# Patient Record
Sex: Male | Born: 1945
Health system: Southern US, Community
[De-identification: ages and names within clinical notes are randomized; demographics above are authoritative.]

## PROBLEM LIST (undated history)

## (undated) DIAGNOSIS — I499 Cardiac arrhythmia, unspecified: Secondary | ICD-10-CM

## (undated) DIAGNOSIS — K219 Gastro-esophageal reflux disease without esophagitis: Secondary | ICD-10-CM

## (undated) DIAGNOSIS — D696 Thrombocytopenia, unspecified: Secondary | ICD-10-CM

## (undated) DIAGNOSIS — R000 Tachycardia, unspecified: Secondary | ICD-10-CM

## (undated) DIAGNOSIS — M6740 Ganglion, unspecified site: Secondary | ICD-10-CM

## (undated) DIAGNOSIS — E785 Hyperlipidemia, unspecified: Secondary | ICD-10-CM

## (undated) HISTORY — PX: OTHER SURGICAL HISTORY: SHX169

## (undated) HISTORY — DX: Ganglion, unspecified site: M67.40

## (undated) HISTORY — DX: Thrombocytopenia, unspecified: D69.6

## (undated) HISTORY — PX: GANGLION CYST EXCISION: SHX1691

## (undated) HISTORY — PX: KNEE ARTHROSCOPY: SUR90

## (undated) HISTORY — DX: Tachycardia, unspecified: R00.0

## (undated) HISTORY — DX: Hyperlipidemia, unspecified: E78.5

## (undated) HISTORY — PX: TONSILLECTOMY: SUR1361

---

## 2003-07-12 ENCOUNTER — Encounter: Payer: Self-pay | Admitting: Cardiology

## 2003-07-15 ENCOUNTER — Ambulatory Visit (HOSPITAL_COMMUNITY): Admission: RE | Admit: 2003-07-15 | Discharge: 2003-07-15 | Payer: Self-pay | Admitting: *Deleted

## 2003-07-22 ENCOUNTER — Ambulatory Visit (HOSPITAL_COMMUNITY): Admission: RE | Admit: 2003-07-22 | Discharge: 2003-07-22 | Payer: Self-pay | Admitting: Cardiology

## 2003-07-29 ENCOUNTER — Ambulatory Visit (HOSPITAL_COMMUNITY): Admission: RE | Admit: 2003-07-29 | Discharge: 2003-07-29 | Payer: Self-pay | Admitting: *Deleted

## 2003-12-19 ENCOUNTER — Ambulatory Visit (HOSPITAL_COMMUNITY): Admission: RE | Admit: 2003-12-19 | Discharge: 2003-12-19 | Payer: Self-pay | Admitting: General Surgery

## 2006-08-21 ENCOUNTER — Encounter (INDEPENDENT_AMBULATORY_CARE_PROVIDER_SITE_OTHER): Payer: Self-pay | Admitting: *Deleted

## 2006-08-21 ENCOUNTER — Ambulatory Visit (HOSPITAL_COMMUNITY): Admission: RE | Admit: 2006-08-21 | Discharge: 2006-08-21 | Payer: Self-pay | Admitting: Orthopaedic Surgery

## 2007-02-12 ENCOUNTER — Emergency Department (HOSPITAL_COMMUNITY): Admission: EM | Admit: 2007-02-12 | Discharge: 2007-02-12 | Payer: Self-pay | Admitting: Emergency Medicine

## 2007-12-07 ENCOUNTER — Ambulatory Visit: Payer: Self-pay | Admitting: Internal Medicine

## 2007-12-07 ENCOUNTER — Ambulatory Visit (HOSPITAL_COMMUNITY): Admission: RE | Admit: 2007-12-07 | Discharge: 2007-12-07 | Payer: Self-pay | Admitting: Internal Medicine

## 2008-05-31 ENCOUNTER — Ambulatory Visit (HOSPITAL_COMMUNITY): Admission: RE | Admit: 2008-05-31 | Discharge: 2008-05-31 | Payer: Self-pay | Admitting: Orthopaedic Surgery

## 2009-10-04 ENCOUNTER — Encounter (INDEPENDENT_AMBULATORY_CARE_PROVIDER_SITE_OTHER): Payer: Self-pay | Admitting: *Deleted

## 2009-10-04 LAB — CONVERTED CEMR LAB
ALT: 12 units/L
AST: 14 units/L
Albumin: 4.6 g/dL
Alkaline Phosphatase: 53 units/L
BUN: 16 mg/dL
CO2: 27 meq/L
Calcium: 9.6 mg/dL
Chloride: 102 meq/L
Cholesterol: 214 mg/dL
Creatinine, Ser: 0.92 mg/dL
GFR calc non Af Amer: >60 mL/min
Glomerular Filtration Rate, Af Am: >60 mL/min/{1.73_m2}
Glucose, Bld: 85 mg/dL
HDL: 78 mg/dL
LDL Cholesterol: 121 mg/dL
Potassium: 4.6 meq/L
Sodium: 140 meq/L
Total Protein: 7.3 g/dL
Triglycerides: 77 mg/dL

## 2009-10-07 ENCOUNTER — Observation Stay (HOSPITAL_COMMUNITY): Admission: EM | Admit: 2009-10-07 | Discharge: 2009-10-08 | Payer: Self-pay | Admitting: Emergency Medicine

## 2009-10-07 ENCOUNTER — Encounter (INDEPENDENT_AMBULATORY_CARE_PROVIDER_SITE_OTHER): Payer: Self-pay | Admitting: *Deleted

## 2009-10-07 LAB — CONVERTED CEMR LAB
BUN: 17 mg/dL
CO2: 28 meq/L
Calcium: 9.2 mg/dL
Chloride: 103 meq/L
Creatinine, Ser: 0.95 mg/dL
GFR calc non Af Amer: >60 mL/min
Glomerular Filtration Rate, Af Am: >60 mL/min/{1.73_m2}
Glucose, Bld: 111 mg/dL
Potassium: 3.7 meq/L
Sodium: 139 meq/L

## 2009-10-08 ENCOUNTER — Encounter (INDEPENDENT_AMBULATORY_CARE_PROVIDER_SITE_OTHER): Payer: Self-pay | Admitting: *Deleted

## 2009-10-08 LAB — CONVERTED CEMR LAB
ALT: 14 units/L
AST: 15 units/L
Albumin: 3.5 g/dL
Alkaline Phosphatase: 45 units/L
BUN: 13 mg/dL
CO2: 28 meq/L
Calcium: 8.7 mg/dL
Chloride: 108 meq/L
Cholesterol: 191 mg/dL
Cholesterol: 191 mg/dL
Creatinine, Ser: 0.88 mg/dL
GFR calc non Af Amer: >60 mL/min
Glomerular Filtration Rate, Af Am: >60 mL/min/{1.73_m2}
Glucose, Bld: 101 mg/dL
HDL: 66 mg/dL
HDL: 66 mg/dL
LDL Cholesterol: 107 mg/dL
LDL Cholesterol: 107 mg/dL
Potassium: 4.2 meq/L
Sodium: 141 meq/L
Total Protein: 6 g/dL
Triglycerides: 88 mg/dL
Triglycerides: 88 mg/dL

## 2009-10-09 ENCOUNTER — Encounter: Payer: Self-pay | Admitting: Cardiology

## 2009-10-11 ENCOUNTER — Encounter (INDEPENDENT_AMBULATORY_CARE_PROVIDER_SITE_OTHER): Payer: Self-pay | Admitting: *Deleted

## 2009-10-11 DIAGNOSIS — D696 Thrombocytopenia, unspecified: Secondary | ICD-10-CM

## 2009-10-11 DIAGNOSIS — R079 Chest pain, unspecified: Secondary | ICD-10-CM | POA: Insufficient documentation

## 2009-10-12 ENCOUNTER — Ambulatory Visit: Payer: Self-pay | Admitting: Cardiology

## 2009-10-13 ENCOUNTER — Encounter: Payer: Self-pay | Admitting: Cardiology

## 2009-10-24 ENCOUNTER — Encounter (INDEPENDENT_AMBULATORY_CARE_PROVIDER_SITE_OTHER): Payer: Self-pay | Admitting: *Deleted

## 2009-10-24 ENCOUNTER — Ambulatory Visit: Payer: Self-pay | Admitting: Cardiology

## 2009-10-24 DIAGNOSIS — R5381 Other malaise: Secondary | ICD-10-CM | POA: Insufficient documentation

## 2009-10-24 DIAGNOSIS — R5383 Other fatigue: Secondary | ICD-10-CM

## 2009-10-24 LAB — CONVERTED CEMR LAB: TSH: 2.292 microintl units/mL (ref 0.350–4.500)

## 2009-10-25 ENCOUNTER — Ambulatory Visit: Payer: Self-pay | Admitting: Cardiovascular Disease

## 2009-10-25 ENCOUNTER — Ambulatory Visit (HOSPITAL_COMMUNITY): Admission: RE | Admit: 2009-10-25 | Discharge: 2009-10-25 | Payer: Self-pay | Admitting: Cardiology

## 2009-10-25 ENCOUNTER — Encounter: Payer: Self-pay | Admitting: Cardiology

## 2009-10-30 ENCOUNTER — Encounter (INDEPENDENT_AMBULATORY_CARE_PROVIDER_SITE_OTHER): Payer: Self-pay | Admitting: *Deleted

## 2009-11-01 ENCOUNTER — Encounter (INDEPENDENT_AMBULATORY_CARE_PROVIDER_SITE_OTHER): Payer: Self-pay | Admitting: *Deleted

## 2009-11-01 ENCOUNTER — Ambulatory Visit: Payer: Self-pay | Admitting: Cardiology

## 2009-12-14 ENCOUNTER — Ambulatory Visit: Payer: Self-pay | Admitting: Cardiology

## 2010-01-03 ENCOUNTER — Encounter: Payer: Self-pay | Admitting: Cardiology

## 2010-10-09 NOTE — Miscellaneous (Signed)
Summary: hosp labs 10/07/09-10/08/2009  Clinical Lists Changes  Observations: Added new observation of CALCIUM: 8.7 mg/dL (35/57/3220 25:42) Added new observation of ALBUMIN: 3.5 g/dL (70/62/3762 83:15) Added new observation of PROTEIN, TOT: 6.0 g/dL (17/61/6073 71:06) Added new observation of SGPT (ALT): 14 units/L (10/08/2009 16:33) Added new observation of SGOT (AST): 15 units/L (10/08/2009 16:33) Added new observation of ALK PHOS: 45 units/L (10/08/2009 16:33) Added new observation of GFR AA: >60 mL/min/1.44m2 (10/08/2009 16:33) Added new observation of GFR: >60 mL/min (10/08/2009 16:33) Added new observation of CREATININE: 0.88 mg/dL (26/94/8546 27:03) Added new observation of BUN: 13 mg/dL (50/05/3817 29:93) Added new observation of BG RANDOM: 101 mg/dL (71/69/6789 38:10) Added new observation of CO2 PLSM/SER: 28 meq/L (10/08/2009 16:33) Added new observation of CL SERUM: 108 meq/L (10/08/2009 16:33) Added new observation of K SERUM: 4.2 meq/L (10/08/2009 16:33) Added new observation of NA: 141 meq/L (10/08/2009 16:33) Added new observation of LDL: 107 mg/dL (17/51/0258 52:77) Added new observation of HDL: 66 mg/dL (82/42/3536 14:43) Added new observation of TRIGLYC TOT: 88 mg/dL (15/40/0867 61:95) Added new observation of CHOLESTEROL: 191 mg/dL (09/32/6712 45:80) Added new observation of CALCIUM: 9.2 mg/dL (99/83/3825 05:39) Added new observation of GFR AA: >60 mL/min/1.7m2 (10/07/2009 16:33) Added new observation of GFR: >60 mL/min (10/07/2009 16:33) Added new observation of CREATININE: 0.95 mg/dL (76/73/4193 79:02) Added new observation of BUN: 17 mg/dL (40/97/3532 99:24) Added new observation of BG RANDOM: 111 mg/dL (26/83/4196 22:29) Added new observation of CO2 PLSM/SER: 28 meq/L (10/07/2009 16:33) Added new observation of CL SERUM: 103 meq/L (10/07/2009 16:33) Added new observation of K SERUM: 3.7 meq/L (10/07/2009 16:33) Added new observation of NA: 139 meq/L (10/07/2009  16:33)

## 2010-10-09 NOTE — Letter (Signed)
Summary: PROGRESS NOTE  PROGRESS NOTE   Imported By: Faythe Ghee 10/13/2009 09:50:05  _____________________________________________________________________  External Attachment:    Type:   Image     Comment:   External Document

## 2010-10-09 NOTE — Assessment & Plan Note (Signed)
Summary: f/u [facility] per checkout on 10/24/09/tg   Visit Type:  Follow-up Primary Provider:  Sabino Snipes  CC:  no complaints today.  History of Present Illness: Return visit for this very pleasant 65 year old gentleman who recently underwent treadmill testing for chest discomfort.  He did fairly well on the treadmill, achieving a work load of 9 met.  There were no electrocardiogram changes to suggest ischemia.  He experienced no chest discomfort and stopped with dyspnea and fatigue.  Immediately after the cessation of exercise, wide-complex tachycardia occurred without symptoms that persisted for approximately 15 seconds.  It was not entirely clear whether this was supraventricular tachycardia with aberrancy or ventricular tachycardia.  Patient reports a remote history of palpitations, which have recently recurred.  One episode persisted for an entire day.  He describes a fluttering of his heart without other associated symptoms.  He has not had recurrent chest discomfort.  There has been no lightheadedness or syncope.  His wife reports that he is under substantial stress both job-related and family related.  Current Medications (verified): 1)  Aspir-Low 81 Mg Tbec (Aspirin) .... Take 1 Tab Daily 2)  Daily Multi  Tabs (Multiple Vitamins-Minerals) .... Take 1 Tab Daily  Allergies (verified): No Known Drug Allergies  Past History:     Past Medical History: Exercise-induced wide complex tachycardia CHEST PAIN (ICD-786.50) Borderline thrombocytopenia Borderline hyperlipidemia  Review of Systems       See history of present illness  Vital Signs:  Patient profile:   65 year old male Weight:      185 pounds Pulse rate:   61 / minute BP sitting:   119 / 72  (right arm)  Vitals Entered By: Dreama Saa, CNA (November 01, 2009 1:40 PM)  Physical Exam  General:   General-Well-developed; no acute distress; weight and height are proportionate HEENT-/AT; PERRL; EOM intact; conjunctiva  and lids nl:  Neck-No JVD; no carotid bruits: Lungs-No tachypnea, clear without rales, rhonchi or wheezes: Abdomen-BS normal; soft and non-tender without masses or organomegaly: Skin- Warm, no sig. lesions: Extremities- no edema    Impression & Recommendations:  Problem # 1:  WCT-EXERCISE-INDUCED (ICD-427.1) The patient, his wife and I had a long discussion about supraventricular and ventricular arrhythmias and cardiac disease in general I explained that with a normal echocardiogram, his arrhythmias on the certainly benign.  Treatment with a calcium channel antagonist or beta blocker could be undertaken, but there would be no end point to allow [place] to determine efficacy.  Since his risk of sudden death should be minimal, no intervention to lower that likelihood is appropriate.  I will review his tracings and history with one of our electrophysiologists for further guidance.  Since he is having continuing palpitations, and event recorder will be provided to him for 21 days.  I will see him again in 4 weeks.  25 minutes spent in discussion during this visit.  Addendum:  Tracing reviewed with Dr. Ladona Ridgel.  He agrees that rhythm is likely SVT, perhaps atrial flutter, with aberrancy.  He concurs that no pharmacologic treatment is necessarily indicated.  Other Orders: Cardionet/Event Monitor (Cardionet/Event)  Echocardiogram  Procedure date:  10/25/2009  Findings:      LV-no left ventricular hypertrophy; normal regional and global function; estimated ejection fraction 55-60%.  No significant valvular abnormalities.  No chamber enlargement.   Patient Instructions: 1)  Your physician recommends that you schedule a follow-up appointment in: 1 month 2)  Your physician has recommended that you wear an event monitor.  Event monitors are medical devices that record the heart's electrical activity. Doctors most often use these monitors to diagnose arrhythmias. Arrhythmias are problems with the  speed or rhythm of the heartbeat. The monitor is a small, portable device. You can wear one while you do your normal daily activities. This is usually used to diagnose what is causing palpitations/syncope (passing out).

## 2010-10-09 NOTE — Miscellaneous (Signed)
  Clinical Lists Changes  Problems: Added new problem of FATIGUE (ICD-780.79) Orders: Added new Test order of T-TSH [phone]) - Signed Added new Referral order of 2-D Echocardiogram (2D Echo) - Signed

## 2010-10-09 NOTE — Cardiovascular Report (Signed)
Summary: End of Summary Report  End of Summary Report   Imported By: Lenard Forth 02/15/2010 14:14:16  _____________________________________________________________________  External Attachment:    Type:   Image     Comment:   External Document

## 2010-10-09 NOTE — Letter (Signed)
Summary: [place] [facility], agricultural at [facility]  [address], [place] [address]   Phone: [phone]  Fax: [phone]      October 30, 2009 MRN: [id number]   Greg Mann [address] [place], [place]  [address]   Dear Mr. Kozak,  Your test ordered by Selena Batten has been reviewed by your physician (or physician assistant) and was found to be normal or stable. Your physician (or physician assistant) felt no changes were needed at this time.  _x___ Echocardiogram  ____ Cardiac Stress Test  __x__ Lab Work  ____ Peripheral vascular study of arms, legs or neck  ____ CT scan or X-ray  ____ Lung or Breathing test  ____ Other:  No change in medical treatment at this time, per Dr. Dietrich Pates.  Enclosed is a copy of your labwork for your records.  Thank you, Khalia Gong Allyne Gee RN    [place] Bing, MD, Lenise Arena.C.Gaylord Shih, MD, F.A.C.C Lewayne Bunting, MD, F.A.C.C Nona Dell, MD, F.A.C.C Charlton Haws, MD, Lenise Arena.C.C

## 2010-10-09 NOTE — Miscellaneous (Signed)
Summary: labs cmp,lipids,10/04/2009  Clinical Lists Changes  Observations: Added new observation of CALCIUM: 9.6 mg/dL (16/06/9603 5:40) Added new observation of ALBUMIN: 4.6 g/dL (98/07/9146 8:29) Added new observation of PROTEIN, TOT: 7.3 g/dL (56/21/3086 5:78) Added new observation of SGPT (ALT): 12 units/L (10/04/2009 8:38) Added new observation of SGOT (AST): 14 units/L (10/04/2009 8:38) Added new observation of ALK PHOS: 53 units/L (10/04/2009 8:38) Added new observation of GFR AA: >60 mL/min/1.2m2 (10/04/2009 8:38) Added new observation of GFR: >60 mL/min (10/04/2009 8:38) Added new observation of CREATININE: 0.92 mg/dL (46/96/2952 8:41) Added new observation of BUN: 16 mg/dL (32/44/0102 7:25) Added new observation of BG RANDOM: 85 mg/dL (36/64/4034 7:42) Added new observation of CO2 PLSM/SER: 27 meq/L (10/04/2009 8:38) Added new observation of CL SERUM: 102 meq/L (10/04/2009 8:38) Added new observation of K SERUM: 4.6 meq/L (10/04/2009 8:38) Added new observation of NA: 140 meq/L (10/04/2009 8:38) Added new observation of LDL: 121 mg/dL (59/56/3875 6:43) Added new observation of HDL: 78 mg/dL (32/95/1884 1:66) Added new observation of TRIGLYC TOT: 77 mg/dL (03/08/1600 0:93) Added new observation of CHOLESTEROL: 214 mg/dL (23/55/7322 0:25)

## 2010-10-09 NOTE — Letter (Signed)
Summary: letter from dr hardin  letter from dr hardin   Imported By: Faythe Ghee 10/13/2009 15:02:22  _____________________________________________________________________  External Attachment:    Type:   Image     Comment:   External Document

## 2010-10-09 NOTE — Assessment & Plan Note (Signed)
Summary: **Initial OV and Stress Test(in append)   Visit Type:  Initial Consult Primary Provider:  Sabino Snipes   History of Present Illness: [name] is seen at the kind request of Dr. Catalina Pizza for evaluation of chest discomfort.  This nice gentleman was previously evaluated by Dr. Dorethea Clan in 2004 for palpitations, cardiomegaly and chest discomfort.  An echocardiogram at that time was essentially normal.  A stress nuclear study was  normal with good exercise tolerance.  Chest x-ray showed mild bronchitic changes.  Patient has done well in the intervening years with no apparent vascular disease and few risk factors.  He has had no hypertension, no diabetes and average values on lipid profiles.  There is a positive family history with his father dying due to aortic aneurysm and his brother requiring a stent for coronary disease.  Current symptoms are limited to a single episode.  The patient noted palpitations early in the day and then developed some mild burning discomfort around the heart.  There was some associated pressure as well with radiation to the left arm.  Symptoms continued on and off all day prompting the patient to seek evaluation in the emergency department.  During an observation stay, cardiac markers and serial EKGs were negative.  Current Medications (verified): 1)  Aspir-Low 81 Mg Tbec (Aspirin) .... Take 1 Tab Daily 2)  Daily Multi  Tabs (Multiple Vitamins-Minerals) .... Take 1 Tab Daily 3)  Omeprazole 20 Mg Cpdr (Omeprazole) .... 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  Alcohol Use -  yes(occasionally wine) Regular Exercise - no Drug Use - no  Past Medical History: CHEST PAIN (ICD-786.50) Borderline thrombocytopenia Borderline hyperlipidemia  Past Surgical History: Arthroscopic left knee procedure Left wrist ganglion excised by Dr.Jenkins Tonsillectomy  EKG  Procedure date:  07-Nov-2009  Findings:      Normal sinus rhythm Borderline biatrial enlargement Right axis deviation PACs Slightly delayed R-wave progression When compared to a previous tracing of 07/15/2003, R wave progression was previously entirely normal.   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  Alcohol Use - yes(occasionally wine) Regular Exercise - no Drug Use - no  Review of Systems       Corrective lenses required.  Occasional mild palpitations.  Gastroesophageal reflux disease symptoms.  All other systems reviewed and are negative.  Vital Signs:  Patient profile:   65 year old male Height:      71 inches Weight:      183 pounds BMI:     25.62 Pulse rate:   69 / minute BP sitting:   123 / 79  (right arm)  Vitals Entered By: Dreama Saa, CNA 11-07-2009 1:44 PM)  Physical Exam  General:   General-Well-developed; no acute distress; weight and height are proportionate HEENT-Advance/AT; PERRL; EOM intact; conjunctiva and lids nl:  Neck-No JVD; no carotid bruits: Endocrine-No thyromegaly: Lungs-No tachypnea, clear without rales, rhonchi or wheezes: CV-normal PMI; normal S1 and  S2; minimal systolic murmur Abdomen-BS normal; soft and non-tender without masses or organomegaly: MS-No deformities, cyanosis or clubbing: Neurologic-Nl cranial nerves; symmetric strength and tone: Skin- Warm, no sig. lesions: Extremities-Nl distal  pulses; no edema    Impression & Recommendations:  Problem # 1:  CHEST PAIN (ICD-786.50) Chest discomfort is certainly of some concern, particularly with radiation to the left arm; however, his symptoms are atypical in terms of their prolonged nature without any evidence for myocardial ischemia and lack of relationship to exertion.  For a single episode in this gentleman with few cardiovascular risk factors, stress testing should provide an adequate assessment for possible heart disease.  We will schedule that test at the patient's earliest convenience and will let Dr. Margo Aye  know the results once it has been completed.  Unless additional problems develop, I do not believe that a return office visit will be necessary.  Other Orders: Treadmill (Treadmill)  Patient Instructions: 1)  Your physician recommends that you schedule a follow-up appointment in: as needed  2)  Your physician recommends that you continue on your current medications as directed. Please refer to the Current Medication list given to you today. 3)  Your physician has requested that you have an exercise tolerance test.  For further information please visit [email or web address]  Please also follow instruction sheet, as given.  Appended Document: Treadmill Stress Test w/o imaging Graded Exercise Test  Treadmill exercise was performed to a workload of 7.8 METs and a heart rate of 193, 123% of age-predicted maximum.  Exercise was discontinued due to to dyspnea and fatigue; no chest pain reported.  Blood pressure-resting value was 145/80.  With exercise, there was no significant change.  Early in recovery, blood pressure fell to 110/50, an abnormal response.    Baseline electrocardiogram-normal sinus rhythm; left atrial abnormality; slightly delayed R-wave progression; nondiagnostic inferior Q waves; right axis deviation. Stress electrocardiogram-significant upsloping ST segment depression.  Arrhythmias-during exercise, no  arrhythmias were noted.  Immediately upon cessation, a narrow complex nonsustained supraventricular rhythm was present at a rate of 171.  This was intermittently replaced or followed by a wide complex tachycardia at a similar rate.  Following the fourth minute of recovery, only an occasional premature wide complex beat was present.  None of these arrhythmias were symptomatic.  Impression: Abnormal graded exercise test revealing impaired exercise capacity, an abnormal blood pressure response and no electrocardiographic evidence for myocardial ischemia.  Early in recovery, arrhythmia was present that appear to represent a supraventricular tachycardia or atrial flutter with intermittent aberrant conduction.  Other findings as noted.  [place] Bing, M.D.

## 2010-11-25 LAB — LIPID PANEL
Cholesterol: 191 mg/dL (ref 0–200)
HDL: 66 mg/dL (ref >39)
LDL Cholesterol: 107 mg/dL — ABNORMAL HIGH (ref 0–99)
Total CHOL/HDL Ratio: 2.9 RATIO
Triglycerides: 88 mg/dL (ref <150)
VLDL: 18 mg/dL (ref 0–40)

## 2010-11-25 LAB — COMPREHENSIVE METABOLIC PANEL
ALT: 14 U/L (ref 0–53)
AST: 15 U/L (ref 0–37)
Albumin: 3.5 g/dL (ref 3.5–5.2)
Alkaline Phosphatase: 45 U/L (ref 39–117)
BUN: 13 mg/dL (ref 6–23)
CO2: 28 mEq/L (ref 19–32)
Calcium: 8.7 mg/dL (ref 8.4–10.5)
Chloride: 108 mEq/L (ref 96–112)
Creatinine, Ser: 0.88 mg/dL (ref 0.4–1.5)
GFR calc Af Amer: >60 mL/min (ref >60)
GFR calc non Af Amer: >60 mL/min (ref >60)
Glucose, Bld: 101 mg/dL — ABNORMAL HIGH (ref 70–99)
Potassium: 4.2 mEq/L (ref 3.5–5.1)
Sodium: 141 mEq/L (ref 135–145)
Total Bilirubin: 1.1 mg/dL (ref 0.3–1.2)
Total Protein: 6 g/dL (ref 6.0–8.3)

## 2010-11-25 LAB — CARDIAC PANEL(CRET KIN+CKTOT+MB+TROPI)
CK, MB: 2.7 ng/mL (ref 0.3–4.0)
CK, MB: 2.8 ng/mL (ref 0.3–4.0)
Relative Index: 2.5 (ref 0.0–2.5)
Relative Index: INVALID (ref 0.0–2.5)
Total CK: 113 U/L (ref 7–232)
Total CK: 96 U/L (ref 7–232)
Troponin I: 0.02 ng/mL (ref 0.00–0.06)
Troponin I: 0.02 ng/mL (ref 0.00–0.06)
Troponin I: 0.04 ng/mL (ref 0.00–0.06)

## 2010-11-25 LAB — DIFFERENTIAL
Basophils Absolute: 0 10*3/uL (ref 0.0–0.1)
Basophils Absolute: 0 10*3/uL (ref 0.0–0.1)
Basophils Relative: 0 % (ref 0–1)
Basophils Relative: 1 % (ref 0–1)
Eosinophils Absolute: 0.2 10*3/uL (ref 0.0–0.7)
Eosinophils Absolute: 0.3 10*3/uL (ref 0.0–0.7)
Eosinophils Relative: 3 % (ref 0–5)
Eosinophils Relative: 5 % (ref 0–5)
Lymphocytes Relative: 27 % (ref 12–46)
Lymphocytes Relative: 29 % (ref 12–46)
Lymphs Abs: 1.4 10*3/uL (ref 0.7–4.0)
Lymphs Abs: 2.5 10*3/uL (ref 0.7–4.0)
Monocytes Absolute: 0.5 10*3/uL (ref 0.1–1.0)
Monocytes Absolute: 0.7 10*3/uL (ref 0.1–1.0)
Monocytes Relative: 10 % (ref 3–12)
Monocytes Relative: 8 % (ref 3–12)
Neutro Abs: 3 10*3/uL (ref 1.7–7.7)
Neutro Abs: 5.1 10*3/uL (ref 1.7–7.7)
Neutrophils Relative %: 58 % (ref 43–77)
Neutrophils Relative %: 59 % (ref 43–77)

## 2010-11-25 LAB — POCT CARDIAC MARKERS
CKMB, poc: 2.2 ng/mL (ref 1.0–8.0)
Myoglobin, poc: 71.5 ng/mL (ref 12–200)
Troponin i, poc: <0.05 ng/mL (ref 0.00–0.09)

## 2010-11-25 LAB — BASIC METABOLIC PANEL
BUN: 17 mg/dL (ref 6–23)
CO2: 28 mEq/L (ref 19–32)
Calcium: 9.2 mg/dL (ref 8.4–10.5)
Chloride: 103 mEq/L (ref 96–112)
Creatinine, Ser: 0.95 mg/dL (ref 0.4–1.5)
GFR calc Af Amer: >60 mL/min (ref >60)
GFR calc non Af Amer: >60 mL/min (ref >60)
Glucose, Bld: 111 mg/dL — ABNORMAL HIGH (ref 70–99)
Potassium: 3.7 mEq/L (ref 3.5–5.1)
Sodium: 139 mEq/L (ref 135–145)

## 2010-11-25 LAB — TSH: TSH: 2.149 u[IU]/mL (ref 0.350–4.500)

## 2010-11-25 LAB — CBC
HCT: 40.5 % (ref 39.0–52.0)
HCT: 44.6 % (ref 39.0–52.0)
Hemoglobin: 13.7 g/dL (ref 13.0–17.0)
Hemoglobin: 15 g/dL (ref 13.0–17.0)
MCHC: 33.6 g/dL (ref 30.0–36.0)
MCHC: 33.8 g/dL (ref 30.0–36.0)
MCV: 95.2 fL (ref 78.0–100.0)
MCV: 96.1 fL (ref 78.0–100.0)
Platelets: 125 10*3/uL — ABNORMAL LOW (ref 150–400)
Platelets: 137 10*3/uL — ABNORMAL LOW (ref 150–400)
RBC: 4.22 MIL/uL (ref 4.22–5.81)
RBC: 4.68 MIL/uL (ref 4.22–5.81)
RDW: 13.8 % (ref 11.5–15.5)
RDW: 13.9 % (ref 11.5–15.5)
WBC: 5.2 10*3/uL (ref 4.0–10.5)
WBC: 8.6 10*3/uL (ref 4.0–10.5)

## 2010-11-25 LAB — VITAMIN B12: Vitamin B-12: 300 pg/mL (ref 211–911)

## 2010-11-25 LAB — D-DIMER, QUANTITATIVE: D-Dimer, Quant: 0.33 ug/mL-FEU (ref 0.00–0.48)

## 2011-01-22 NOTE — Op Note (Signed)
NAME:  Greg Mann, Greg NO.:  [id number]   MEDICAL RECORD NO.:  [id number]          PATIENT TYPE:  AMB   LOCATION:  DAY                           FACILITY:  [facility]   PHYSICIAN:  R. Roetta Sessions, M.D. DATE OF BIRTH:  [birth year]   DATE OF PROCEDURE:  12/07/2007  DATE OF DISCHARGE:                               OPERATIVE REPORT   PROCEDURE:  Screening colonoscopy.   INDICATIONS FOR PROCEDURE:  A 65 year old gentleman with no lower GI  tract symptoms, sent over courtesy of Dr. Catalina Pizza for colorectal  cancer screening.  He has never his lower GI tract imaged.  There is no  family history of colorectal placed.  Colonoscopy is now being done.  This approach has been discussed with the patient at length.  Potential  risks, benefits, limitations, and alternatives have been reviewed and  his questions answered.  He is agreeable to this.  Please see  documentation in the medical record.   PROCEDURE NOTE:  O2 saturation, blood pressure, pulse, and respirations  monitor throughout the entire procedure.   CONSCIOUS SEDATION:  Versed 5 mg IV. Demerol 100 grams IV in divided  doses.   INSTRUMENT:  Pentax video chip system.   FINDINGS:  Digital rectal exam revealed no abnormalities.  Endoscopic  findings:  The prep was unfortunately marginal.  Colon:  Colonic mucosa  was surveyed from the rectosigmoid junction through the left,  transverse, and right colon to the appendiceal orifice, ileocecal valve,  and cecum.  These structures well seen and photographed for the record.  Terminal ileum was entered for 5 cm.  From this level, the scope was  slowly withdrawn.  All previously mentioned mucosal surfaces were again  seen.  It is notable that as I advanced scope into the ascending colon,  initially the video image went out, and it came back and went out  intermittently as I worked gently the insertion tube.  This was felt to  be a faulty instrument.  Therefore, it was withdrawn,  and a second scope  was obtained.  It easily advanced to the cecum and then pulled back.  All previously mentioned mucosal surfaces of the colon were again seen.  There was quite a bit of granular stool with vegetable material which  required copious washing and suctioning to adequately see the mucosa.  The mucosa of the colon was felt to be seen fairly well, although very  small flat lesions may have been obscured by the poor prep.  However,  the colonic mucosa that was seen did appeared normal.  The scope was  pulled down to the rectum, where thorough examination of the rectal  mucosa, including retroflexed view of the anal verge, demonstrated only  internal hemorrhoids.  The patient tolerated the procedure well and was  reactivated.   ENDOSCOPIC IMPRESSION:  Internal hemorrhoids, otherwise normal rectum,  colon, and terminal ileum.  Marginal prep made exam more difficult.   RECOMMENDATIONS:  1. Yearly Hemoccults.  2. Repeat screening colonoscopy in 10 years.      Jonathon Bellows, M.D.  Electronically Signed  RMR/MEDQ  D:  12/07/2007  T:  12/07/2007  Job:  [id number]   cc:   Catalina Pizza, M.D.  Fax: [phone]

## 2011-01-25 NOTE — Procedures (Signed)
NAME:  Greg Mann, Greg Mann                         ACCOUNT NO.:  [id number]   MEDICAL RECORD NO.:  [id number]                   PATIENT TYPE:  OUT   LOCATION:  RAD                                  FACILITY:  [facility]   PHYSICIAN:  [place] Bing, M.D.               DATE OF BIRTH:  1945-09-21   DATE OF PROCEDURE:  07/29/2003  DATE OF DISCHARGE:                                  ECHOCARDIOGRAM   CLINICAL DATA:  A 66 year old gentleman with cardiomegaly.   M-MODE MEASUREMENTS:  Aorta 2.4.   Left atrium 4.1.   Septum 1.1.   Posterior wall 1.0.   Left ventricular diastole 4.1.   Left ventricular systole 2.6.   Right ventricle 4.3.   1. Technically adequate echocardiographic study.  2. Very mild enlargement of both the right and left atria. The right     ventricle is mildly enlarged with normal wall thickness and normal     function.  3. Slight aortic sclerosis with normal function.  4. Slight mitral valve thickening with normal function.  5. Normal tricuspid and pulmonic valves; physiologic tricuspid     regurgitation; estimated right ventricular systolic pressure at the upper     limit of normal.  6. Inferior vena cava diameter upper normal; normal decrease in diameter     with inspiration.      ___________________________________________                                            [place] Bing, M.D.   RR/MEDQ  D:  07/29/2003  T:  07/29/2003  Job:  [id number]

## 2011-01-25 NOTE — H&P (Signed)
NAME:  Greg Mann, MCFETRIDGE NO.:  [id number]   MEDICAL RECORD NO.:  [id number]          PATIENT TYPE:  AMB   LOCATION:                                FACILITY:  [facility]   PHYSICIAN:  J. Darreld Mclean, M.D. DATE OF BIRTH:  07/11/1946   DATE OF ADMISSION:  DATE OF DISCHARGE:  [facility]                              HISTORY & PHYSICAL   CHIEF COMPLAINT:  My left knee hurts.   HISTORY OF PRESENT ILLNESS:  The patient is a 65 year old male with pain  and tenderness in his left knee for approximately one month to six  weeks.  It is getting progressively worse.  He has had swelling, limping  and slight giving way.  I first saw him in my office on August 04, 2006 and I was concerned of a strain in the medial collateral ligament  and a medial meniscal tear.  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  I informed him of the  findings on August 07, 2006. He wanted to think about it, talk about  with his family, possible surgery.  His knee pain has gotten worse. He  is limping more, knee is giving way, it is swelling more and he desires  to undergo arthroscopy of the left knee.  The risks and imponderables of  the procedure have been discussed with him and he appears to understand  and agrees to the procedure as outlined.   PAST MEDICAL HISTORY:   CURRENT MEDICATIONS:  He is currently taking Naprosyn 500 mg one p.o.  b.i.d. and Vicodin 5/500 p.r.n. pain.   ALLERGIES:  He has no allergies.   PAST SURGICAL HISTORY:  He has had a tonsillectomy and adenoidectomy as  a child and left wrist ganglion removed in the past.   SOCIAL HISTORY:  He does not smoke.  He drinks alcoholic beverages  socially.   PRIMARY CARE PHYSICIAN:  Edward L. Juanetta Gosling, M.D. is his family doctor.   FAMILY HISTORY:  He denies any diseases that run in the family.   PHYSICAL EXAMINATION:  VITAL SIGNS:   The patient's vital signs are  within normal limits.  HEENT:  Negative.  NECK:  Supple.  LUNGS:  Clear to auscultation and percussion.  HEART:  Regular without murmurs heard.  ABDOMEN:  Soft, tender without masses.  EXTREMITIES:  Left knee is swollen with pain and tenderness with an  effusion.  He has crepitus.  Range of motion is from 5 degrees to 95  with pain.  Other extremities are within normal limits.  CNS:  Intact.  SKIN:  Intact.   IMPRESSION:  Tear medial meniscus left knee.   PLAN:  I have discussed with the patient the planned procedure, risks  and imponderables.  Patient understands the procedure as outlined.  Labs  are pending.  ______________________________  [name]. Darreld Mclean, M.D.     JWK/MEDQ  D:  08/18/2006  T:  08/18/2006  Job:  [id number]

## 2011-01-25 NOTE — Procedures (Signed)
NAME:  CALE, BETHARD                         ACCOUNT NO.:  [id number]   MEDICAL RECORD NO.:  [id number]                   PATIENT TYPE:  AMB   LOCATION:  DAY                                  FACILITY:  [facility]   PHYSICIAN:  Edward L. Juanetta Gosling, M.D.             DATE OF BIRTH:  July 18, 1946   DATE OF PROCEDURE:  DATE OF DISCHARGE:                                EKG INTERPRETATION   TIME:  December 15, 2003, at 12:13.   FINDINGS:  1. The rhythm is a sinus rhythm with a rate of about 60.  2. There is an intraventricular conduction delay.  3. There is possible biatrial enlargement.  4. P-wave abnormalities are seen, mostly with an unusual shape of the T     wave.   IMPRESSION:  Abnormal electrocardiogram.      ___________________________________________                                            Oneal Deputy. Juanetta Gosling, M.D.   ELH/MEDQ  D:  12/15/2003  T:  12/16/2003  Job:  [id number]

## 2011-01-25 NOTE — Op Note (Signed)
NAME:  Greg Mann, LACK NO.:  [id number]   MEDICAL RECORD NO.:  [id number]          PATIENT TYPE:  AMB   LOCATION:  DAY                           FACILITY:  [facility]   PHYSICIAN:  J. Darreld Mclean, M.D. DATE OF BIRTH:  10/27/1945   DATE OF PROCEDURE:  08/21/2006  DATE OF DISCHARGE:                               OPERATIVE REPORT   PREOPERATIVE DIAGNOSIS:  Tear left knee medial meniscus.   POSTOPERATIVE DIAGNOSIS:  Tear left knee medial meniscus.   OPERATION PERFORMED:  Operative arthroscopy, partial medial  meniscectomy.   SURGEON:  J. Darreld Mclean, M.D.   ANESTHESIA:  General.   TOURNIQUET TIME:  25 mg.   INDICATIONS FOR PROCEDURE:  The patient is a 65 year old male with pain  and tenderness in his left knee.  This became progressively worse over  the last month or so.  He had MRI done that showed a tear of the  posterior horn of the medial meniscus.  Surgery was recommended.  Risks  and imponderables of the procedure were discussed preoperatively.  He  appeared to understand the procedure as outlined.   DESCRIPTION OF PROCEDURE:  The patient was taken to the holding area.  The left knee was identified as the correct surgical side.  He placed a  mark on the left knee.  I placed a mark on the left knee.  He was  brought back to the operating room.  He was given general anesthesia and  placed supine.  Tourniquet and leg holder placed deflated left upper  thigh.  He was prepped and draped in the usual manner. A timeout to  identify Mr. Hornback as the patient and left knee as the correct  surgical site.  Leg was elevated after being prepped and draped and  wrapped circumferentially with an Esmarch bandage.  Tourniquet inflated  at 300 mmHg.  Esmarch bandage removed.  Inflow cannula inserted  medially.  Lactated Ringers __________ knee by infusion pump.  Arthroscope inserted laterally, knee systematically examined.   Suprapatellar pouch.  There was some mild  synovitis undersurface of  patella, looked good.  There was grade 2 changes.  Medially there was  tear in posterior horn medial meniscus, grade 2 and 3 changes femoral  condyle.  Anterior cruciates intact.  Laterally meniscus looked  normal  and no apparent tear.   He had no loose bodies.  Using a meniscal shaver and meniscal punch and  the laser, good smooth contour was obtained in the posterior horn of the  meniscus.  I removed an area that was torn.  I re-examined the knee and  no new pathology found.  Permanent pictures taken throughout the  procedure.  Wound was reapproximated with 3-0 nylon, interrupted  vertical mattress manner.  Marcaine 0.25% was instilled in each portal.  Tourniquet was deflated.  The  patient tolerated the procedure well.  Went to recovery in good  condition.  Prescription for Vicodin ES was given for pain.  I will see  him in the office in approximately 10 days to two weeks.  Physical  therapy has been arranged.  Any difficulties contact me through the  office hospital beeper system.  Numbers have been provided.           ______________________________  [name]. Darreld Mclean, M.D.     JWK/MEDQ  D:  08/21/2006  T:  08/21/2006  Job:  [id number]

## 2011-01-25 NOTE — Op Note (Signed)
NAME:  Greg Mann, Greg Mann                         ACCOUNT NO.:  [id number]   MEDICAL RECORD NO.:  [id number]                   PATIENT TYPE:  AMB   LOCATION:  DAY                                  FACILITY:  [facility]   PHYSICIAN:  Dalia Heading, M.D.               DATE OF BIRTH:  01-Dec-1945   DATE OF PROCEDURE:  12/19/2003  DATE OF DISCHARGE:                                 OPERATIVE REPORT   PREOPERATIVE DIAGNOSIS:  Ganglion cysts, left wrist and left fourth finger.   POSTOPERATIVE DIAGNOSIS:  Ganglion cysts, left wrist and left fourth finger.   OPERATION/PROCEDURE:  Excision of ganglion cysts, left wrist and left fourth  finger.   SURGEON:  Dalia Heading, M.D.   ANESTHESIA:  Regional block.   INDICATIONS:  The patient is a 65 year old white male who presents with  symptomatic ganglion cyst of the left wrist and left fourth finger.  The  risks and benefits of procedures including bleeding, infection, recurrence  of the cysts were fully explained to the patient who gave informed consent.   DESCRIPTION OF PROCEDURE:  The patient was placed in the supine position.  The left wrist was blocked using a regional by anesthesia.  The left wrist  and hand were prepped and draped using the usual sterile technique with  Betadine.  Surgical site confirmation was performed.   A longitudinal incision was made along the dorsum of the left wrist over the  ganglion cyst.  This measured approximately 2.5 cm in size.  This was taken  down to the base and the cyst was excised without difficulty.  It was sent  to pathology for further examination.  No abnormal bleeding was noted.  The  skin was reapproximated using a 4-0 nylon interrupted suture.   A small, less than 5 mm, cyst was noted just proximal to the left nail bed  dorsally.  This was excised without difficulty.  The incision was  reapproximated using a 5-0 interrupted suture.  Betadine ointment and dry  sterile dressings were applied to both  wounds.   All tape and needle counts correct at the end of the procedure.  The patient  was transferred to the PACU in stable condition.  No complications.  Specimens were ganglion cysts left wrist and left fourth finger.  Blood loss  none.      ___________________________________________                                            Dalia Heading, M.D.   MAJ/MEDQ  D:  12/19/2003  T:  12/19/2003  Job:  [id number]   cc:   Corrie Mckusick, M.D.  [address] [place]. [place]  [place]  [place] [address]  Fax: [phone]

## 2011-01-25 NOTE — H&P (Signed)
NAME:  Greg Mann, Greg Mann                         ACCOUNT NO.:  [id number]   MEDICAL RECORD NO.:  [id number]                   PATIENT TYPE:  AMB   LOCATION:  DAY                                  FACILITY:  [facility]   PHYSICIAN:  Dalia Heading, M.D.               DATE OF BIRTH:  [birth year]   DATE OF ADMISSION:  DATE OF DISCHARGE:                                HISTORY & PHYSICAL   CHIEF COMPLAINT:  Ganglion cyst, left wrist and left fourth finger.   HISTORY OF PRESENT ILLNESS:  Patient is a 65 year old white male who is  referred for evaluation and treatment of several ganglion cysts.  One is on  his left wrist and another is just proximal to the nail of the left fourth  finger.  Both have been present for some time, although they are causing  increase in discomfort.   PAST MEDICAL HISTORY:  Unremarkable.   PAST SURGICAL HISTORY:  Tonsillectomy.   CURRENT MEDICATIONS:  None.   ALLERGIES:  No known drug allergies.   REVIEW OF SYSTEMS:  Noncontributory.   PHYSICAL EXAMINATION:  GENERAL:  Patient is a well-developed and well-  nourished white male in no acute distress.  VITAL SIGNS:  He is afebrile.  Vital signs are stable.  LUNGS:  Clear to auscultation with equal breath sounds bilaterally.  HEART:  Regular rate and rhythm without S3, S4, or murmurs.  EXTREMITIES:  The left wrist with a posterior, 2.5 cm, mobile, subcutaneous  mass present.  A less than 1 cm oval mass is noted just proximal to the  nailbed of the left fourth finger.  Some abnormal growth of the nailbed is  noted.   IMPRESSION:  Ganglion cysts, left wrist and left fourth finger.   PLAN:  The patient is scheduled for excision of the ganglion cyst, left  wrist, and left fourth finger on December 19, 2003.  The risks and benefits of  the procedures, including bleeding, infection, recurrence of the cysts, and  abnormal growth of the nail were fully explained to the patient.  Gave  informed consent.     ___________________________________________                                         Dalia Heading, M.D.   MAJ/MEDQ  D:  12/15/2003  T:  12/15/2003  Job:  [id number]   cc:   Corrie Mckusick, M.D.  [address] [place]. [place]  [place]  [place] [address]  Fax: [phone]

## 2011-01-25 NOTE — H&P (Signed)
NAME:  Greg Mann, HU NO.:  [id number]   MEDICAL RECORD NO.:  [id number]          PATIENT TYPE:  AMB   LOCATION:  DAY                           FACILITY:  [facility]   PHYSICIAN:  J. Darreld Mclean, M.D. DATE OF BIRTH:  1945/10/02   DATE OF ADMISSION:  DATE OF DISCHARGE:  LH                              HISTORY & PHYSICAL   CHIEF COMPLAINT:  My left knee hurts.   The patient is a 65 year old male with pain and tenderness in his left  knee for approximately a month.  I first saw him in the office on  November 26th, complaining of left knee pain for 2-3 weeks.  This got  progressively worse with giving-way and swelling.  He had pain over the  medial collateral ligament.  I was concerned about a strain to the  medial collateral ligament, a possible medial meniscal tear.  I arranged  an MRI of the left knee which was done on November 27.  This showed a  large posterior horn medial meniscal tear with medial compartment  chondromalacia.  I explained the findings to him on November 29th.  He  wanted to think about possible surgery.  He called and I saw him on  December 10th.  He wants to go ahead and have arthroscopy of the knee.  I have discussed with the patient the planned procedure, risks, and  imponderables.  He appears to understands the procedure as outlined.   PAST HISTORY:  Negative.  He denies heart disease, lung disease, kidney  disease, stroke, paralysis, weakness, hypertension, diabetes, TB,  rheumatic fever, cancer, polio, ulcer disease, circulatory problems.   ALLERGIES:  HE HAS NO ALLERGIES.   MEDICATIONS:  He is currently taking:  1. Naprosyn 500 mg p.o. b.i.d.  2. Vicodin 5/500.   He does not smoke.  He uses alcoholic beverages socially.   FAMILY PHYSICIAN:  Dr. Juanetta Gosling.   He has had a cyst removed from his   Dictation ended at this point.                                            ______________________________  J. Darreld Mclean, M.D.     JWK/MEDQ  D:  08/19/2006  T:  08/19/2006  Job:  [id number]

## 2011-09-05 ENCOUNTER — Encounter: Payer: Self-pay | Admitting: Cardiology

## 2012-11-02 ENCOUNTER — Emergency Department (HOSPITAL_COMMUNITY): Payer: 59

## 2012-11-02 ENCOUNTER — Encounter (HOSPITAL_COMMUNITY): Payer: Self-pay | Admitting: *Deleted

## 2012-11-02 ENCOUNTER — Emergency Department (HOSPITAL_COMMUNITY)
Admission: EM | Admit: 2012-11-02 | Discharge: 2012-11-02 | Disposition: A | Discharge status: Home / Self Care | Payer: 59 | Attending: Emergency Medicine | Admitting: Emergency Medicine

## 2012-11-02 DIAGNOSIS — Z8639 Personal history of other endocrine, nutritional and metabolic disease: Secondary | ICD-10-CM | POA: Insufficient documentation

## 2012-11-02 DIAGNOSIS — R079 Chest pain, unspecified: Secondary | ICD-10-CM | POA: Insufficient documentation

## 2012-11-02 DIAGNOSIS — Z8679 Personal history of other diseases of the circulatory system: Secondary | ICD-10-CM | POA: Insufficient documentation

## 2012-11-02 DIAGNOSIS — Z7982 Long term (current) use of aspirin: Secondary | ICD-10-CM | POA: Insufficient documentation

## 2012-11-02 DIAGNOSIS — Z862 Personal history of diseases of the blood and blood-forming organs and certain disorders involving the immune mechanism: Secondary | ICD-10-CM | POA: Insufficient documentation

## 2012-11-02 DIAGNOSIS — Z8739 Personal history of other diseases of the musculoskeletal system and connective tissue: Secondary | ICD-10-CM | POA: Insufficient documentation

## 2012-11-02 LAB — TROPONIN I
Troponin I: <0.3 ng/mL (ref <0.30)
Troponin I: <0.3 ng/mL (ref <0.30)

## 2012-11-02 LAB — CBC WITH DIFFERENTIAL/PLATELET
Basophils Absolute: 0 10*3/uL (ref 0.0–0.1)
Eosinophils Relative: 4 % (ref 0–5)
HCT: 43.7 % (ref 39.0–52.0)
Lymphocytes Relative: 23 % (ref 12–46)
MCHC: 33.2 g/dL (ref 30.0–36.0)
MCV: 95.2 fL (ref 78.0–100.0)
Monocytes Absolute: 0.5 10*3/uL (ref 0.1–1.0)
RDW: 13.9 % (ref 11.5–15.5)
WBC: 7.2 10*3/uL (ref 4.0–10.5)

## 2012-11-02 LAB — COMPREHENSIVE METABOLIC PANEL
AST: 19 U/L (ref 0–37)
BUN: 15 mg/dL (ref 6–23)
CO2: 29 mEq/L (ref 19–32)
Calcium: 9.8 mg/dL (ref 8.4–10.5)
Creatinine, Ser: 1.02 mg/dL (ref 0.50–1.35)
GFR calc Af Amer: 86 mL/min — ABNORMAL LOW (ref >90)
GFR calc non Af Amer: 74 mL/min — ABNORMAL LOW (ref >90)

## 2012-11-02 MED ORDER — HYDROCODONE-ACETAMINOPHEN 5-325 MG PO TABS: 1.0000 | ORAL_TABLET | Freq: Four times a day (QID) | ORAL | Status: DC | PRN

## 2012-11-02 MED ORDER — MORPHINE SULFATE 4 MG/ML IJ SOLN
4.0000 mg | Freq: Once | INTRAMUSCULAR | Status: AC
  Administered 2012-11-02: 4 mg via INTRAVENOUS
  Filled 2012-11-02: qty 1

## 2012-11-02 NOTE — ED Provider Notes (Signed)
History    This chart was scribed for Benny Lennert, MD by Charolett Bumpers, ED Scribe. The patient was seen in room [facility]. Patient's care was started at 1850.   CSN: [id number]  Arrival date & time 11/02/12  4098   First MD Initiated Contact with Patient 11/02/12 1850      Chief Complaint  Patient presents with  . Chest Pain   Greg Mann is a 67 y.o. male who presents to the Emergency Department complaining of left sided burning sensation in his chest that radiates down his left arm for the past couple of days. He states that the pain has gradually eased up. He states that he has had acid reflux for the past couple of weeks in which he started taking nexium once daily. He denies any nausea, vomiting, SOB, diaphoresis, leg pain or leg swelling. He reports a h/o similar symptoms a couple of years ago with nothing significant found with work up at that time. He takes a baby aspirin daily.   PCP: Dr. Dwana Melena  Patient is a 67 y.o. male presenting with chest pain. The history is provided by the patient. No language interpreter was used.  Chest Pain Pain location:  L chest Pain quality: burning   Pain radiates to:  L arm Pain severity:  Moderate Onset quality:  Gradual Duration:  2 days Progression:  Improving Ineffective treatments:  Antacids Associated symptoms: no abdominal pain, no back pain, no cough, no diaphoresis, no fatigue, no headache, no nausea, no shortness of breath and not vomiting     Past Medical History  Diagnosis Date  . Exercise-induced tachycardia   . Ganglion     left wrist  . Chest pain   . Thrombocytopenia     borderline  . Borderline hyperlipidemia     Past Surgical History  Procedure Laterality Date  . Knee arthroscopy      Left  . Ganglion cyst excision      Left Wrist  . Tonsillectomy      Family History  Problem Relation Age of Onset  . Aneurysm Father     Aortic  . Coronary artery disease Brother     PCI    History   Substance Use Topics  . Smoking status: Never Smoker   . Smokeless tobacco: Not on file  . Alcohol Use: Yes     Comment: Occasional wine      Review of Systems  Constitutional: Negative for diaphoresis and fatigue.  HENT: Negative for congestion, sinus pressure and ear discharge.   Eyes: Negative for discharge.  Respiratory: Negative for cough and shortness of breath.   Cardiovascular: Positive for chest pain.  Gastrointestinal: Negative for nausea, vomiting, abdominal pain and diarrhea.  Genitourinary: Negative for frequency and hematuria.  Musculoskeletal: Negative for back pain.  Skin: Negative for rash.  Neurological: Negative for seizures and headaches.  Psychiatric/Behavioral: Negative for hallucinations.  All other systems reviewed and are negative.    Allergies  Review of patient's allergies indicates no known allergies.  Home Medications   Current Outpatient Rx  Name  Route  Sig  Dispense  Refill  . aspirin 81 MG tablet   Oral   Take 160 mg by mouth daily.           . Multiple Vitamin (MULTIVITAMIN) tablet   Oral   Take 1 tablet by mouth daily.             BP 112/64  Pulse 78  Temp(Src) 97.7 F (36.5 C) (Oral)  Resp 20  Ht 5\' 11"  (1.803 m)  Wt 170 lb (77.111 kg)  BMI 23.72 kg/m2  SpO2 100%  Physical Exam  Nursing note and vitals reviewed. Constitutional: He is oriented to person, place, and time. He appears well-developed.  HENT:  Head: Normocephalic and atraumatic.  Mouth/Throat: Oropharynx is clear and moist.  Eyes: Conjunctivae and EOM are normal. No scleral icterus.  Neck: Neck supple. No thyromegaly present.  Cardiovascular: Normal rate, regular rhythm and normal heart sounds.  Exam reveals no gallop and no friction rub.   No murmur heard. Pulmonary/Chest: Effort normal and breath sounds normal. No stridor. No respiratory distress. He has no wheezes. He has no rales. He exhibits no tenderness.  Abdominal: Soft. Bowel sounds are  normal. He exhibits no distension. There is no tenderness. There is no rebound.  Musculoskeletal: Normal range of motion. He exhibits no edema.  Lymphadenopathy:    He has no cervical adenopathy.  Neurological: He is oriented to person, place, and time. Coordination normal.  Skin: No rash noted. No erythema.  Psychiatric: He has a normal mood and affect. His behavior is normal.    ED Course  Procedures (including critical care time)  DIAGNOSTIC STUDIES: Oxygen Saturation is 100% on room air, normal by my interpretation.    COORDINATION OF CARE:  19:05-Discussed planned course of treatment with the patient including pain management, a chest x-ray, blood work and EKG, who is agreeable at this time.   19:15-Medication Orders: Morphine 4 mg/mL injection 4 mg-once.   22:30-Recheck: Informed pt of imaging and lab results. Pt reports improvement of his chest pain with ED medications. Will d/c with pain medication and increase Nexium to twice daily. Will have f/u with PCP for re-evaluation. He is agreeable with plan.   Results for orders placed during the hospital encounter of 11/02/12  CBC WITH DIFFERENTIAL      Result Value Range   WBC 7.2  4.0 - 10.5 K/uL   RBC 4.59  4.22 - 5.81 MIL/uL   Hemoglobin 14.5  13.0 - 17.0 g/dL   HCT 16.1  09.6 - 04.5 %   MCV 95.2  78.0 - 100.0 fL   MCH 31.6  26.0 - 34.0 pg   MCHC 33.2  30.0 - 36.0 g/dL   RDW 40.9  81.1 - 91.4 %   Platelets 165  150 - 400 K/uL   Neutrophils Relative 66  43 - 77 %   Neutro Abs 4.7  1.7 - 7.7 K/uL   Lymphocytes Relative 23  12 - 46 %   Lymphs Abs 1.6  0.7 - 4.0 K/uL   Monocytes Relative 7  3 - 12 %   Monocytes Absolute 0.5  0.1 - 1.0 K/uL   Eosinophils Relative 4  0 - 5 %   Eosinophils Absolute 0.3  0.0 - 0.7 K/uL   Basophils Relative 1  0 - 1 %   Basophils Absolute 0.0  0.0 - 0.1 K/uL  COMPREHENSIVE METABOLIC PANEL      Result Value Range   Sodium 138  135 - 145 mEq/L   Potassium 3.7  3.5 - 5.1 mEq/L   Chloride  100  96 - 112 mEq/L   CO2 29  19 - 32 mEq/L   Glucose, Bld 114 (*) 70 - 99 mg/dL   BUN 15  6 - 23 mg/dL   Creatinine, Ser 7.82  0.50 - 1.35 mg/dL   Calcium 9.8  8.4 - 95.6  mg/dL   Total Protein 7.5  6.0 - 8.3 g/dL   Albumin 4.1  3.5 - 5.2 g/dL   AST 19  0 - 37 U/L   ALT 14  0 - 53 U/L   Alkaline Phosphatase 63  39 - 117 U/L   Total Bilirubin 0.4  0.3 - 1.2 mg/dL   GFR calc non Af Amer 74 (*) >90 mL/min   GFR calc Af Amer 86 (*) >90 mL/min  TROPONIN I      Result Value Range   Troponin I <0.30  <0.30 ng/mL    Dg Chest Portable 1 View  11/02/2012  *RADIOLOGY REPORT*  Clinical Data: Chest pain.  Thrombocytopenia.  PORTABLE CHEST - 1 VIEW  Comparison: 10/07/2009  Findings: Tortuous thoracic aorta noted.  Heart size within normal limits.  The lungs appear clear.  No pleural effusion identified.  IMPRESSION: 1.  No acute thoracic findings.  2.  Mildly tortuous thoracic aorta.   Original Report Authenticated By: Gaylyn Rong, M.D.      No diagnosis found.    MDM     The chart was scribed for me under my direct supervision.  I personally performed the history, physical, and medical decision making and all procedures in the evaluation of this patient.Benny Lennert, MD 11/02/12 [phone]

## 2012-11-02 NOTE — ED Notes (Signed)
Discharge instructions given and reviewed with patient.  Prescription given for Vicodin; effects and use explained.  Patient verbalized understanding of sedating effects of Vicodin and not to drive while taking.  Patient verbalized understanding to follow up with his primary MD as this week and to increase his Nexium to twice daily.  Patient ambulatory with steady gait; discharged home in good condition.  Wife accompanied discharge to drive home.

## 2012-11-02 NOTE — ED Notes (Signed)
Burning sensation to chest and lt arm .  No N/V,  No sob.

## 2012-11-02 NOTE — ED Notes (Signed)
Patient c/o burning sensation to left arm that radiates "over my heart".  Patient states just started Nexium recently for GERD.  Patient states had this same pain a few years ago and was cardiac etiology was ruled out at that time.

## 2013-02-26 ENCOUNTER — Ambulatory Visit (INDEPENDENT_AMBULATORY_CARE_PROVIDER_SITE_OTHER): Payer: Medicare Other | Admitting: Cardiology

## 2013-02-26 ENCOUNTER — Encounter: Payer: Self-pay | Admitting: Cardiology

## 2013-02-26 VITALS — BP 133/84 | HR 59 | Ht 71.0 in | Wt 176.4 lb

## 2013-02-26 DIAGNOSIS — I471 Supraventricular tachycardia: Secondary | ICD-10-CM | POA: Diagnosis not present

## 2013-02-26 DIAGNOSIS — R002 Palpitations: Secondary | ICD-10-CM | POA: Diagnosis not present

## 2013-02-26 NOTE — Assessment & Plan Note (Signed)
Is difficult to tell by history what is causing his symptoms. It could be an atrial arrhythmia. He's having a couple times a week Sathvika Ojo pain an event recorder with strict instructions to correlate with a diary entry. We'll also obtain a 2-D echocardiogram to assess LV function and any degree of left atrial enlargement or mitral regurgitation. No change in his current medical program for now.

## 2013-02-26 NOTE — Progress Notes (Signed)
HPI Greg Mann  Is referred today by Dr. Margo Aye for the evaluation and management of recurrent tachypalpitations. He reports having this at age 67 but it resolved. Is been labeled exercise induced tachycardia by previous visits here. This started being a problem once again several months ago. It can happen at rest. It is not have a sudden onset or sudden termination. He cannot tell if this regular or irregular. He feels very weak and tired when it happens. It can last up to several hours. There no other associated symptoms.  He was in the emergency room in February of this year for chest pain. EKG at that time showed biatrial enlargement otherwise normal. This was felt to be gastroesophageal reflux.  He denies orthopnea, PND or edema. He's had no syncope or presyncope.  Echocardiogram several years ago was essentially normal except for mild mitral regurgitation.  Past Medical History  Diagnosis Date  . Exercise-induced tachycardia   . Ganglion     left wrist  . Chest pain   . Thrombocytopenia     borderline  . Borderline hyperlipidemia     Current Outpatient Prescriptions  Medication Sig Dispense Refill  . aspirin 81 MG tablet Take 81 mg by mouth daily.       Marland Kitchen esomeprazole (NEXIUM) 40 MG capsule Take 40 mg by mouth as needed.       Marland Kitchen HYDROcodone-acetaminophen (NORCO/VICODIN) 5-325 MG per tablet Take 1 tablet by mouth every 6 (six) hours as needed for pain.  12 tablet  0  . Multiple Vitamin (MULTIVITAMIN) tablet Take 1 tablet by mouth daily.        . Tamsulosin HCl (FLOMAX) 0.4 MG CAPS Take 0.4 mg by mouth as needed.        No current facility-administered medications for this visit.    No Known Allergies  Family History  Problem Relation Age of Onset  . Aneurysm Father     Aortic  . Coronary artery disease Brother     PCI    History   Social History  . Marital Status: Married    Spouse Name: N/A    Number of Children: N/A  . Years of Education: N/A   Occupational  History  . 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 Smoking status: Never Smoker   . Smokeless tobacco: Not on file  . Alcohol Use: Yes     Comment: Occasional wine  . Drug Use: No  . Sexually Active: Not on file   Other Topics Concern  . Not on file   Social History Narrative   Married, lives locally, 2 adult children   No regular exercise    ROS ALL NEGATIVE EXCEPT THOSE NOTED IN HPI  PE  General Appearance: well developed, well nourished in no acute distress HEENT: symmetrical face, PERRLA, good dentition  Neck: no JVD, thyromegaly, or adenopathy, trachea midline Chest: symmetric without deformity Cardiac: PMI non-displaced, RRR, normal S1, S2, no gallop, 2/6 systolic murmur at the apex Lung: clear to ausculation and percussion Vascular: all pulses full without bruits  Abdominal: nondistended, nontender, good bowel sounds, no HSM, no bruits Extremities: no cyanosis, clubbing or edema, no sign of DVT, no varicosities  Skin: normal color, no rashes Neuro: alert and oriented x 3, non-focal Pysch: normal affect  EKG Not repeated, last one done in February of this year. BMET    Component Value Date/Time   NA 138 11/02/2012 1905   K 3.7  11/02/2012 1905   CL 100 11/02/2012 1905   CO2 29 11/02/2012 1905   GLUCOSE 114* 11/02/2012 1905   BUN 15 11/02/2012 1905   CREATININE 1.02 11/02/2012 1905   CALCIUM 9.8 11/02/2012 1905   GFRNONAA 74* 11/02/2012 1905   GFRAA 86* 11/02/2012 1905    Lipid Panel     Component Value Date/Time   CHOL  Value: 191        ATP III CLASSIFICATION:  <200     mg/dL   Desirable  161-096  mg/dL   Borderline High  >=045    mg/dL   High        12/16/8117 0620   TRIG 88 10/08/2009 0620   HDL 66 10/08/2009 0620   CHOLHDL 2.9 10/08/2009 0620   VLDL 18 10/08/2009 0620   LDLCALC  Value: 107        Total Cholesterol/HDL:CHD Risk Coronary Heart Disease Risk Table                     Men   Women  1/2 Average Risk    3.4   3.3  Average Risk       5.0   4.4  2 X Average Risk   9.6   7.1  3 X Average Risk  23.4   11.0        Use the calculated Patient Ratio above and the CHD Risk Table to determine the patient's CHD Risk.        ATP III CLASSIFICATION (LDL):  <100     mg/dL   Optimal  147-829  mg/dL   Near or Above                    Optimal  130-159  mg/dL   Borderline  562-130  mg/dL   High  >865     mg/dL   Very High* 7/84/6962 0620    CBC    Component Value Date/Time   WBC 7.2 11/02/2012 1905   RBC 4.59 11/02/2012 1905   HGB 14.5 11/02/2012 1905   HCT 43.7 11/02/2012 1905   PLT 165 11/02/2012 1905   MCV 95.2 11/02/2012 1905   MCH 31.6 11/02/2012 1905   MCHC 33.2 11/02/2012 1905   RDW 13.9 11/02/2012 1905   LYMPHSABS 1.6 11/02/2012 1905   MONOABS 0.5 11/02/2012 1905   EOSABS 0.3 11/02/2012 1905   BASOSABS 0.0 11/02/2012 1905

## 2013-02-26 NOTE — Patient Instructions (Addendum)
Your physician has requested that you have an echocardiogram. Echocardiography is a painless test that uses sound waves to create images of your heart. It provides your doctor with information about the size and shape of your heart and how well your heart's chambers and valves are working. This procedure takes approximately one hour. There are no restrictions for this procedure.  Your physician has recommended that you wear an event monitor. Event monitors are medical devices that record the heart's electrical activity. Doctors most often us these monitors to diagnose arrhythmias. Arrhythmias are problems with the speed or rhythm of the heartbeat. The monitor is a small, portable device. You can wear one while you do your normal daily activities. This is usually used to diagnose what is causing palpitations/syncope (passing out).  Your physician recommends that you continue on your current medications as directed. Please refer to the Current Medication list given to you today.  

## 2013-03-01 ENCOUNTER — Ambulatory Visit (HOSPITAL_COMMUNITY)
Admission: RE | Admit: 2013-03-01 | Discharge: 2013-03-01 | Disposition: A | Discharge status: Home / Self Care | Payer: Medicare Other | Source: Ambulatory Visit | Attending: Cardiology | Admitting: Cardiology

## 2013-03-01 DIAGNOSIS — R002 Palpitations: Secondary | ICD-10-CM

## 2013-03-01 DIAGNOSIS — I059 Rheumatic mitral valve disease, unspecified: Secondary | ICD-10-CM | POA: Diagnosis not present

## 2013-03-01 DIAGNOSIS — I498 Other specified cardiac arrhythmias: Secondary | ICD-10-CM | POA: Diagnosis not present

## 2013-03-01 NOTE — Progress Notes (Signed)
*  PRELIMINARY RESULTS* Echocardiogram 2D Echocardiogram has been performed.  Greg Mann [name] 03/01/2013, 10:44 AM 

## 2013-03-23 ENCOUNTER — Other Ambulatory Visit: Payer: Self-pay | Admitting: *Deleted

## 2013-03-23 DIAGNOSIS — R002 Palpitations: Secondary | ICD-10-CM

## 2013-04-06 ENCOUNTER — Telehealth: Payer: Self-pay | Admitting: *Deleted

## 2013-04-06 NOTE — Telephone Encounter (Signed)
Pt calling for results of event monitor/ already scanned under media

## 2013-04-06 NOTE — Telephone Encounter (Signed)
Noted pt event monitor results scanned into media noted pt with A-fib/flutter and SVT, please advise instructions if neccessary

## 2013-04-07 MED ORDER — METOPROLOL TARTRATE 25 MG PO TABS: 25.0000 mg | ORAL_TABLET | Freq: Two times a day (BID) | ORAL | Status: DC

## 2013-04-07 NOTE — Telephone Encounter (Signed)
I spoke with pt and he agrees to start metoprolol 25mg  twice a day. Holter monitor results are scanned in computer & available. Mylo Red RN

## 2013-04-07 NOTE — Telephone Encounter (Signed)
It appears that he may have an atrial arrhythmia. Please get results of monitor for me to review. In the meantime, I would start him on metoprolol 25 mg twice a day.

## 2013-04-23 ENCOUNTER — Encounter: Payer: Self-pay | Admitting: Cardiology

## 2013-04-23 ENCOUNTER — Ambulatory Visit (INDEPENDENT_AMBULATORY_CARE_PROVIDER_SITE_OTHER): Payer: Medicare Other | Admitting: Cardiology

## 2013-04-23 ENCOUNTER — Ambulatory Visit: Payer: Medicare Other | Admitting: Cardiology

## 2013-04-23 VITALS — BP 124/80 | HR 46 | Ht 71.0 in | Wt 175.0 lb

## 2013-04-23 DIAGNOSIS — I4891 Unspecified atrial fibrillation: Secondary | ICD-10-CM | POA: Diagnosis not present

## 2013-04-23 DIAGNOSIS — R079 Chest pain, unspecified: Secondary | ICD-10-CM | POA: Diagnosis not present

## 2013-04-23 DIAGNOSIS — R002 Palpitations: Secondary | ICD-10-CM

## 2013-04-23 DIAGNOSIS — I480 Paroxysmal atrial fibrillation: Secondary | ICD-10-CM | POA: Insufficient documentation

## 2013-04-23 NOTE — Progress Notes (Signed)
HPI Greg Mann returns today for evaluation and management of atrial fibrillation that we picked up on an event recorder. His echocardiogram is essentially normal. I started him on Lopressor 25 mg twice a day which is very much eliminated the tachycardia palpitations. He denies any syncope, presyncope, but has occasional orthostasis with some lightheadedness.  His heart rates 46 on EKG today in sinus bradycardia. He does not complaining of fatigue. His risk of thromboembolic stroke is low. He is taking aspirin 81 mg a day.  Past Medical History  Diagnosis Date  . Exercise-induced tachycardia   . Ganglion     left wrist  . Chest pain   . Thrombocytopenia     borderline  . Borderline hyperlipidemia     Current Outpatient Prescriptions  Medication Sig Dispense Refill  . aspirin 81 MG tablet Take 81 mg by mouth daily.       Marland Kitchen b complex vitamins tablet Take 1 tablet by mouth daily.      Marland Kitchen esomeprazole (NEXIUM) 40 MG capsule Take 40 mg by mouth as needed.       Marland Kitchen HYDROcodone-acetaminophen (NORCO/VICODIN) 5-325 MG per tablet Take 1 tablet by mouth every 6 (six) hours as needed for pain.  12 tablet  0  . metoprolol tartrate (LOPRESSOR) 25 MG tablet Take 1 tablet (25 mg total) by mouth 2 (two) times daily.  180 tablet  3  . Multiple Vitamin (ANTIOXIDANT FORMULA PO) Take by mouth.      . Multiple Vitamin (MULTIVITAMIN) tablet Take 1 tablet by mouth daily.        . Multiple Vitamins-Minerals (MULTIVITAL) tablet Take 1 tablet by mouth daily.      . Tamsulosin HCl (FLOMAX) 0.4 MG CAPS Take 0.4 mg by mouth as needed.        No current facility-administered medications for this visit.    No Known Allergies  Family History  Problem Relation Age of Onset  . Aneurysm Father     Aortic  . Coronary artery disease Brother     PCI    History   Social History  . Marital Status: Married    Spouse Name: N/A    Number of Children: N/A  . Years of Education: N/A   Occupational History  .  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 Smoking status: Never Smoker   . Smokeless tobacco: Not on file  . Alcohol Use: Yes     Comment: Occasional wine  . Drug Use: No  . Sexual Activity: Not on file   Other Topics Concern  . Not on file   Social History Narrative   Married, lives locally, 2 adult children   No regular exercise    ROS ALL NEGATIVE EXCEPT THOSE NOTED IN HPI  PE  General Appearance: well developed, well nourished in no acute distress HEENT: symmetrical face, PERRLA, good dentition  Neck: no JVD, thyromegaly, or adenopathy, trachea midline Chest: symmetric without deformity Cardiac: PMI non-displaced, RRR, normal S1, S2, no gallop or murmur Lung: clear to ausculation and percussion Vascular: all pulses full without bruits  Abdominal: nondistended, nontender, good bowel sounds, no HSM, no bruits Extremities: no cyanosis, clubbing or edema, no sign of DVT, no varicosities  Skin: normal color, no rashes Neuro: alert and oriented x 3, non-focal Pysch: normal affect  EKG Sinus bradycardia BMET    Component Value Date/Time   NA 138 11/02/2012 1905   K 3.7 11/02/2012 1905  CL 100 11/02/2012 1905   CO2 29 11/02/2012 1905   GLUCOSE 114* 11/02/2012 1905   BUN 15 11/02/2012 1905   CREATININE 1.02 11/02/2012 1905   CALCIUM 9.8 11/02/2012 1905   GFRNONAA 74* 11/02/2012 1905   GFRAA 86* 11/02/2012 1905    Lipid Panel     Component Value Date/Time   CHOL  Value: 191        ATP III CLASSIFICATION:  <200     mg/dL   Desirable  161-096  mg/dL   Borderline High  >=045    mg/dL   High        12/16/8117 0620   TRIG 88 10/08/2009 0620   HDL 66 10/08/2009 0620   CHOLHDL 2.9 10/08/2009 0620   VLDL 18 10/08/2009 0620   LDLCALC  Value: 107        Total Cholesterol/HDL:CHD Risk Coronary Heart Disease Risk Table                     Men   Women  1/2 Average Risk   3.4   3.3  Average Risk       5.0   4.4  2 X Average Risk   9.6   7.1  3  X Average Risk  23.4   11.0        Use the calculated Patient Ratio above and the CHD Risk Table to determine the patient's CHD Risk.        ATP III CLASSIFICATION (LDL):  <100     mg/dL   Optimal  147-829  mg/dL   Near or Above                    Optimal  130-159  mg/dL   Borderline  562-130  mg/dL   High  >865     mg/dL   Very High* 7/84/6962 0620    CBC    Component Value Date/Time   WBC 7.2 11/02/2012 1905   RBC 4.59 11/02/2012 1905   HGB 14.5 11/02/2012 1905   HCT 43.7 11/02/2012 1905   PLT 165 11/02/2012 1905   MCV 95.2 11/02/2012 1905   MCH 31.6 11/02/2012 1905   MCHC 33.2 11/02/2012 1905   RDW 13.9 11/02/2012 1905   LYMPHSABS 1.6 11/02/2012 1905   MONOABS 0.5 11/02/2012 1905   EOSABS 0.3 11/02/2012 1905   BASOSABS 0.0 11/02/2012 1905

## 2013-04-23 NOTE — Patient Instructions (Signed)
Your physician recommends that you schedule a follow-up appointment in: 1 Year with Dr Reggy Eye will receive a reminder letter two months in advance reminding you to call and schedule your appointment. If you don't receive this letter, please contact our office.  Your physician recommends that you continue on your current medications as directed. Please refer to the Current Medication list given to you today.

## 2013-04-23 NOTE — Assessment & Plan Note (Signed)
This is a new diagnosis. I've discussed and answered all questions for him and his wife. He will remain on low-dose metoprolol but will report any fatigue or other symptoms of bradycardia. He is low risk for thromboembolic events and I will keep him on aspirin 81 mg a day. I'll plan on scheduling him back in cardiology in one year.

## 2013-05-19 DIAGNOSIS — I10 Essential (primary) hypertension: Secondary | ICD-10-CM | POA: Diagnosis not present

## 2013-05-19 DIAGNOSIS — N401 Enlarged prostate with lower urinary tract symptoms: Secondary | ICD-10-CM | POA: Diagnosis not present

## 2013-05-19 DIAGNOSIS — E785 Hyperlipidemia, unspecified: Secondary | ICD-10-CM | POA: Diagnosis not present

## 2013-05-21 DIAGNOSIS — Z0000 Encounter for general adult medical examination without abnormal findings: Secondary | ICD-10-CM | POA: Diagnosis not present

## 2013-05-21 DIAGNOSIS — K219 Gastro-esophageal reflux disease without esophagitis: Secondary | ICD-10-CM | POA: Diagnosis not present

## 2013-05-21 DIAGNOSIS — Z23 Encounter for immunization: Secondary | ICD-10-CM | POA: Diagnosis not present

## 2013-05-21 DIAGNOSIS — N401 Enlarged prostate with lower urinary tract symptoms: Secondary | ICD-10-CM | POA: Diagnosis not present

## 2013-06-17 ENCOUNTER — Telehealth: Payer: Self-pay | Admitting: *Deleted

## 2013-06-17 NOTE — Telephone Encounter (Signed)
Pt started taking around 04/23/13 Metoprolol and it is causing him to be dizzy and light headed.

## 2013-06-17 NOTE — Telephone Encounter (Signed)
Spoke to pt. He advised that he has been dizzy/light headed since taking Metoprolol. States it has been getting worse the past couple of weeks.Pt has no other symptoms. Pt takes 1 tablet in the am with breakfast and 1 tablet with supper. Please advise.

## 2013-06-17 NOTE — Telephone Encounter (Signed)
Left message to call office back

## 2013-06-17 NOTE — Telephone Encounter (Signed)
Left message for pt to call office back

## 2013-06-17 NOTE — Telephone Encounter (Signed)
Decrease metoprolol to 12.5 mg BID. He is taking 25 mg BID now. Check back with him in a couple of days to see how he is doing with medication changes.

## 2013-06-18 MED ORDER — METOPROLOL TARTRATE 12.5 MG HALF TABLET: 12.5000 mg | ORAL_TABLET | Freq: Two times a day (BID) | ORAL | Status: DC

## 2013-06-18 NOTE — Telephone Encounter (Signed)
Spoke to patient concerning lab/test results/instructions from provider. Patient understood.    

## 2013-06-18 NOTE — Addendum Note (Signed)
Addended by: Thompson Grayer on: 06/18/2013 10:49 AM   Modules accepted: Orders

## 2013-06-25 ENCOUNTER — Telehealth: Payer: Self-pay | Admitting: Adult Health

## 2013-06-25 NOTE — Telephone Encounter (Signed)
Noted  

## 2013-06-25 NOTE — Telephone Encounter (Signed)
Called pt and made him an appointment for next Friday. Let me know if you need it any sooner. Pt states that he is just lightheaded. The feeling comes and goes all day. Pt took BP at [employer] last night 147/76, he did not check his pulse.

## 2013-06-25 NOTE — Telephone Encounter (Signed)
Pt advises that even with the decrease of the metoprolol he still feels dizzy, states "it feels like I'm floating". Pt states its not a constant feeling. Pt is at work, so could not get a BP read over the phone. Please advise.

## 2013-06-25 NOTE — Telephone Encounter (Signed)
Will need more information before changing his medications. Need BP and HR. "floating" feeling is very non-specific. How long is it lasting? Is this new? When did it start?  He will need to be scheduled for appt for better evaluation.

## 2013-06-25 NOTE — Telephone Encounter (Signed)
Would like to speak with nurse regarding medications / tgs

## 2013-07-02 ENCOUNTER — Ambulatory Visit (INDEPENDENT_AMBULATORY_CARE_PROVIDER_SITE_OTHER): Payer: Medicare Other | Admitting: Adult Health

## 2013-07-02 ENCOUNTER — Encounter: Payer: Self-pay | Admitting: Adult Health

## 2013-07-02 VITALS — BP 135/84 | HR 67 | Ht 71.0 in | Wt 175.5 lb

## 2013-07-02 DIAGNOSIS — I4891 Unspecified atrial fibrillation: Secondary | ICD-10-CM

## 2013-07-02 DIAGNOSIS — I10 Essential (primary) hypertension: Secondary | ICD-10-CM

## 2013-07-02 DIAGNOSIS — R002 Palpitations: Secondary | ICD-10-CM | POA: Diagnosis not present

## 2013-07-02 DIAGNOSIS — I480 Paroxysmal atrial fibrillation: Secondary | ICD-10-CM

## 2013-07-02 NOTE — Assessment & Plan Note (Signed)
Denies recurrence. 

## 2013-07-02 NOTE — Patient Instructions (Signed)
Your physician recommends that you schedule a follow-up appointment in: 2 weeks with Dr Wyline Mood  Your physician has recommended you make the following change in your medication:  1. Stop Metoprolol

## 2013-07-02 NOTE — Progress Notes (Signed)
    HPI: [name] is a 67 year old patient formerly of Dr. Daleen Squibb who will now be assigned to Dr. Beulah Gandy, we are following for ongoing assessment and management of atrial fibrillation. The patient called on 06/25/2013 requesting adjustment in his medications as he was feeling dizzy and lightheaded. "I feel like I am floating". Requested that he come to the office today for [place] to evaluate his heart rate, blood pressure, and orthostatics.  He had been on metoprolol 25 mg BID but was reduced to 12.5 mg BID in the setting of ongoing dizziness. He is also on Flomax but only uses intermittently. 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  No Known Allergies  Current Outpatient Prescriptions  Medication Sig Dispense Refill  . aspirin 81 MG tablet Take 81 mg by mouth daily.       Marland Kitchen b complex vitamins tablet Take 1 tablet by mouth daily.      Marland Kitchen esomeprazole (NEXIUM) 40 MG capsule Take 40 mg by mouth as needed.       Marland Kitchen HYDROcodone-acetaminophen (NORCO/VICODIN) 5-325 MG per tablet Take 1 tablet by mouth every 6 (six) hours as needed for pain.  12 tablet  0  . metoprolol tartrate (LOPRESSOR) 12.5 mg TABS tablet Take 0.5 tablets (12.5 mg total) by mouth 2 (two) times daily.  60 tablet  3  . Multiple Vitamin (MULTIVITAMIN) tablet Take 1 tablet by mouth daily.        . Tamsulosin HCl (FLOMAX) 0.4 MG CAPS Take 0.4 mg by mouth as needed.        No current facility-administered medications for this visit.    Past Medical History  Diagnosis Date  . Exercise-induced tachycardia   . Ganglion     left wrist  . Chest pain   . Thrombocytopenia     borderline  . Borderline hyperlipidemia     Past Surgical History  Procedure Laterality Date  . Knee arthroscopy      Left  . Ganglion cyst excision      Left Wrist  . Tonsillectomy      ROS: Review of systems complete and found to be negative unless listed above  PHYSICAL EXAM BP  135/84  Pulse 67  Ht 5\' 11"  (1.803 m)  Wt 175 lb 8 oz (79.606 kg)  BMI 24.49 kg/m2  General: Well developed, well nourished, in no acute distress Head: Eyes PERRLA, No xanthomas.   Normal cephalic and atramatic  Lungs: Clear bilaterally to auscultation and percussion. Heart: HRRR S1 S2, without MRG.  Pulses are 2+ & equal.            No carotid bruit. No JVD.  No abdominal bruits. No femoral bruits. Abdomen: Bowel sounds are positive, abdomen soft and non-tender without masses or                  Hernia's noted. Msk:  Back normal, normal gait. Normal strength and tone for age. Extremities: No clubbing, cyanosis or edema.  DP +1 Neuro: Alert and oriented X 3. Psych:  Good affect, responds appropriately  EKG: NSR with bi-atrial enlargement. Rate of 60 bpm.  ASSESSMENT AND PLAN

## 2013-07-02 NOTE — Progress Notes (Deleted)
Name: Greg Mann    DOB: 1946-02-18  Age: 67 y.o.  MR#: [id number]       PCP:  Catalina Pizza, MD      Insurance: Payor: MEDICARE / Plan: MEDICARE PART A AND B / Product Type: *No Product type* /   CC:    Chief Complaint  Patient presents with  . Atrial Fibrillation  . Chest Pain   Still lightheaded after med change of metoprolol. VS Filed Vitals:   07/02/13 1511  BP: 135/84  Pulse: 67  Height: 5\' 11"  (1.803 m)  Weight: 175 lb 8 oz (79.606 kg)    Weights Current Weight  07/02/13 175 lb 8 oz (79.606 kg)  04/23/13 175 lb (79.379 kg)  02/26/13 176 lb 6.4 oz (80.015 kg)    Blood Pressure  BP Readings from Last 3 Encounters:  07/02/13 135/84  04/23/13 124/80  02/26/13 133/84     Admit date:  (Not on file) Last encounter with RMR:  06/25/2013   Allergy Review of patient's allergies indicates no known allergies.  Current Outpatient Prescriptions  Medication Sig Dispense Refill  . aspirin 81 MG tablet Take 81 mg by mouth daily.       Marland Kitchen b complex vitamins tablet Take 1 tablet by mouth daily.      Marland Kitchen esomeprazole (NEXIUM) 40 MG capsule Take 40 mg by mouth as needed.       Marland Kitchen HYDROcodone-acetaminophen (NORCO/VICODIN) 5-325 MG per tablet Take 1 tablet by mouth every 6 (six) hours as needed for pain.  12 tablet  0  . metoprolol tartrate (LOPRESSOR) 12.5 mg TABS tablet Take 0.5 tablets (12.5 mg total) by mouth 2 (two) times daily.  60 tablet  3  . Multiple Vitamin (MULTIVITAMIN) tablet Take 1 tablet by mouth daily.        . Tamsulosin HCl (FLOMAX) 0.4 MG CAPS Take 0.4 mg by mouth as needed.        No current facility-administered medications for this visit.    Discontinued Meds:    Medications Discontinued During This Encounter  Medication Reason  . Multiple Vitamin (ANTIOXIDANT FORMULA PO) Error  . Multiple Vitamins-Minerals (MULTIVITAL) tablet Error    Patient Active Problem List   Diagnosis Date Noted  . Paroxysmal atrial fibrillation 04/23/2013  . Rapid palpitations  02/26/2013  . FATIGUE 10/24/2009  . THROMBOCYTOPENIA 10/11/2009  . CHEST PAIN 10/11/2009    LABS    Component Value Date/Time   NA 138 11/02/2012 1905   NA 141 10/08/2009 0620   NA 141 10/08/2009   K 3.7 11/02/2012 1905   K 4.2 10/08/2009 0620   K 4.2 10/08/2009   CL 100 11/02/2012 1905   CL 108 10/08/2009 0620   CL 108 10/08/2009   CO2 29 11/02/2012 1905   CO2 28 10/08/2009 0620   CO2 28 10/08/2009   GLUCOSE 114* 11/02/2012 1905   GLUCOSE 101* 10/08/2009 0620   GLUCOSE 101 10/08/2009   BUN 15 11/02/2012 1905   BUN 13 10/08/2009 0620   BUN 13 10/08/2009   CREATININE 1.02 11/02/2012 1905   CREATININE 0.88 10/08/2009 0620   CREATININE 0.88 10/08/2009   CALCIUM 9.8 11/02/2012 1905   CALCIUM 8.7 10/08/2009 0620   CALCIUM 8.7 10/08/2009   GFRNONAA 74* 11/02/2012 1905   GFRNONAA >60 10/08/2009 0620   GFRNONAA >60 10/08/2009   GFRAA 86* 11/02/2012 1905   GFRAA  Value: >60        The eGFR has been calculated using the MDRD equation.  This calculation has not been validated in all clinical situations. eGFR's persistently <60 mL/min signify possible Chronic Kidney Disease. 10/08/2009 0620   GFRAA  Value: >60        The eGFR has been calculated using the MDRD equation. This calculation has not been validated in all clinical situations. eGFR's persistently <60 mL/min signify possible Chronic Kidney Disease. 10/07/2009 1705   CMP     Component Value Date/Time   NA 138 11/02/2012 1905   K 3.7 11/02/2012 1905   CL 100 11/02/2012 1905   CO2 29 11/02/2012 1905   GLUCOSE 114* 11/02/2012 1905   BUN 15 11/02/2012 1905   CREATININE 1.02 11/02/2012 1905   CALCIUM 9.8 11/02/2012 1905   PROT 7.5 11/02/2012 1905   ALBUMIN 4.1 11/02/2012 1905   AST 19 11/02/2012 1905   ALT 14 11/02/2012 1905   ALKPHOS 63 11/02/2012 1905   BILITOT 0.4 11/02/2012 1905   GFRNONAA 74* 11/02/2012 1905   GFRAA 86* 11/02/2012 1905       Component Value Date/Time   WBC 7.2 11/02/2012 1905   WBC 5.2 10/08/2009 0620   WBC 8.6 10/07/2009 1705   HGB 14.5  11/02/2012 1905   HGB 13.7 10/08/2009 0620   HGB 15.0 10/07/2009 1705   HCT 43.7 11/02/2012 1905   HCT 40.5 10/08/2009 0620   HCT 44.6 10/07/2009 1705   MCV 95.2 11/02/2012 1905   MCV 96.1 10/08/2009 0620   MCV 95.2 10/07/2009 1705    Lipid Panel     Component Value Date/Time   CHOL  Value: 191        ATP III CLASSIFICATION:  <200     mg/dL   Desirable  161-096  mg/dL   Borderline High  >=045    mg/dL   High        12/16/8117 0620   TRIG 88 10/08/2009 0620   HDL 66 10/08/2009 0620   CHOLHDL 2.9 10/08/2009 0620   VLDL 18 10/08/2009 0620   LDLCALC  Value: 107        Total Cholesterol/HDL:CHD Risk Coronary Heart Disease Risk Table                     Men   Women  1/2 Average Risk   3.4   3.3  Average Risk       5.0   4.4  2 X Average Risk   9.6   7.1  3 X Average Risk  23.4   11.0        Use the calculated Patient Ratio above and the CHD Risk Table to determine the patient's CHD Risk.        ATP III CLASSIFICATION (LDL):  <100     mg/dL   Optimal  147-829  mg/dL   Near or Above                    Optimal  130-159  mg/dL   Borderline  562-130  mg/dL   High  >865     mg/dL   Very High* 7/84/6962 0620    ABG No results found for this basename: phart, pco2, pco2art, po2, po2art, hco3, tco2, acidbasedef, o2sat     Lab Results  Component Value Date   TSH 2.292 10/24/2009   BNP (last 3 results) No results found for this basename: PROBNP,  in the last 8760 hours Cardiac Panel (last 3 results) No results found for this basename: CKTOTAL, CKMB, TROPONINI, RELINDX,  in  the last 72 hours  Iron/TIBC/Ferritin No results found for this basename: iron, tibc, ferritin     EKG Orders placed in visit on 04/23/13  . EKG 12-LEAD     Prior Assessment and Plan Problem List as of 07/02/2013     Cardiovascular and Mediastinum   Paroxysmal atrial fibrillation   Last Assessment & Plan   04/23/2013 Office Visit Written 04/23/2013 11:50 AM by Gaylord Shih, MD     This is a new diagnosis. I've discussed and answered  all questions for him and his wife. He will remain on low-dose metoprolol but will report any fatigue or other symptoms of bradycardia. He is low risk for thromboembolic events and I will keep him on aspirin 81 mg a day. I'll plan on scheduling him back in cardiology in one year.      Hematopoietic and Hemostatic   THROMBOCYTOPENIA     Other   FATIGUE   CHEST PAIN   Rapid palpitations   Last Assessment & Plan   02/26/2013 Office Visit Written 02/26/2013 10:10 AM by Gaylord Shih, MD     Is difficult to tell by history what is causing his symptoms. It could be an atrial arrhythmia. He's having a couple times a week wall pain an event recorder with strict instructions to correlate with a diary entry. We'll also obtain a 2-D echocardiogram to assess LV function and any degree of left atrial enlargement or mitral regurgitation. No change in his current medical program for now.        Imaging: No results found.

## 2013-07-02 NOTE — Assessment & Plan Note (Signed)
He is bradycardic on evaluation today on metoprolol 12.5 mg BID. BUT he has cardiac monitor demonstrating PAF with HR up to 125 bpm. He has been decreased on metoprolol but continues light headed.  Orthostatic BP were positive with no change in HR.  Dropping From 134/83 HR 61 to 118/75 HR 66.  He has been having dizziness but no complaints of palpitations, racing HR with the episodes. He had a Holter Monitor in July of 2014 demonstrating episodes of atrial fib and SVT rates up to 130 bpm. CHAD's Score is 1.  He is on ASA only.   I have decided to take him off metoprolol temporarily to see if this is helpful with is symptoms. I do have concerns, however that his dizziness may be related to recurrent atrial fib, although he denies palpitations or heart racing. I have discussed this with Dr. Purvis Sheffield on site for opinion on the matter. He is in agreement of stopping the metoprolol temporarily, as I am planning on close follow up with the patient in 2 weeks.  He also recommended that he have ambulatory BP monitor to ascertain if his symptoms are related to orthostatic BP as demonstrated in the office today.

## 2013-07-15 ENCOUNTER — Ambulatory Visit (INDEPENDENT_AMBULATORY_CARE_PROVIDER_SITE_OTHER): Payer: Medicare Other | Admitting: Cardiology

## 2013-07-15 ENCOUNTER — Encounter: Payer: Self-pay | Admitting: Cardiology

## 2013-07-15 VITALS — BP 128/78 | HR 68 | Ht 71.0 in | Wt 178.0 lb

## 2013-07-15 DIAGNOSIS — R42 Dizziness and giddiness: Secondary | ICD-10-CM

## 2013-07-15 DIAGNOSIS — I10 Essential (primary) hypertension: Secondary | ICD-10-CM | POA: Diagnosis not present

## 2013-07-15 DIAGNOSIS — R002 Palpitations: Secondary | ICD-10-CM

## 2013-07-15 NOTE — Progress Notes (Signed)
Clinical Summary Greg Mann is a 67 y.o.male former patient of Dr Daleen Squibb, last seen by NP Lyman Bishop seen today for the following problems  1. Afib - was on metoprolol 25mg  bid, reduced to 12.5mg  bid in setting of ongoing dizziness. Still had symptoms on 12.5mg  bid, last clinic visit found to be bradycardic. He was also orthostatic by blood pressure with inapproriate HR response. Metoprolol was discontinued. He continues to have the same symptoms of dizziness off of metoprolol - CHADS2 score of 1, on ASA only  - Reports palpitations, 3-4 episodes since last visit over the last 2 weeks. Feeling of heart fluttering, mild lightheadedness. No SOB. Can last up to 2-3 hours.     Past Medical History  Diagnosis Date  . Exercise-induced tachycardia   . Ganglion     left wrist  . Chest pain   . Thrombocytopenia     borderline  . Borderline hyperlipidemia      No Known Allergies   Current Outpatient Prescriptions  Medication Sig Dispense Refill  . aspirin 81 MG tablet Take 81 mg by mouth daily.       Marland Kitchen b complex vitamins tablet Take 1 tablet by mouth daily.      Marland Kitchen esomeprazole (NEXIUM) 40 MG capsule Take 40 mg by mouth as needed.       Marland Kitchen HYDROcodone-acetaminophen (NORCO/VICODIN) 5-325 MG per tablet Take 1 tablet by mouth every 6 (six) hours as needed for pain.  12 tablet  0  . Multiple Vitamin (MULTIVITAMIN) tablet Take 1 tablet by mouth daily.        . Tamsulosin HCl (FLOMAX) 0.4 MG CAPS Take 0.4 mg by mouth as needed.        No current facility-administered medications for this visit.     Past Surgical History  Procedure Laterality Date  . Knee arthroscopy      Left  . Ganglion cyst excision      Left Wrist  . Tonsillectomy       No Known Allergies    Family History  Problem Relation Age of Onset  . Aneurysm Father     Aortic  . Coronary artery disease Brother     PCI     Social History Mr. Dinkins reports that he has never smoked. He does not have any  smokeless tobacco history on file. Mr. Forehand reports that he drinks alcohol.   Review of Systems CONSTITUTIONAL: No weight loss, fever, chills, weakness or fatigue.  HEENT: Eyes: No visual loss, blurred vision, double vision or yellow sclerae.No hearing loss, sneezing, congestion, runny nose or sore throat.  SKIN: No rash or itching.  CARDIOVASCULAR: per HPI RESPIRATORY: per HPI  GASTROINTESTINAL: No anorexia, nausea, vomiting or diarrhea. No abdominal pain or blood.  GENITOURINARY: No burning on urination, no polyuria NEUROLOGICAL: per HPI.  MUSCULOSKELETAL: No muscle, back pain, joint pain or stiffness.  LYMPHATICS: No enlarged nodes. No history of splenectomy.  PSYCHIATRIC: No history of depression or anxiety.  ENDOCRINOLOGIC: No reports of sweating, cold or heat intolerance. No polyuria or polydipsia.  Marland Kitchen   Physical Examination p 68 bp 128/78 Wt 178 lbs BMI 25 Gen: resting comfortably, no acute distress HEENT: no scleral icterus, pupils equal round and reactive, no palptable cervical adenopathy,  CV: RRR, no m/r/g, no JVD, no carotid bruits Resp: Clear to auscultation bilaterally GI: abdomen is soft, non-tender, non-distended, normal bowel sounds, no hepatosplenomegaly MSK: extremities are warm, no edema.  Skin: warm, no rash Neuro:  no  focal deficits Psych: appropriate affect    Assessment and Plan  1. Afib - reports increased palpitations since being off metoprolol, no change in his dizziness symptoms that are separate from the palpitations - unclear whats causing his dizziness, could be related to afib with RVR, tachy-brady syndrome, or orthostatic related symptoms though he reports the symptoms can occur with sitting as well - reports he was not having any dizziness last time he wore a heart monitor - will repeat a holter monitor for 48 hours to assess his heart rhythm during these dizzy spells - f/u 2-3 weeks, further workup and treatment pending holter  results      Antoine Poche, M.D., F.A.C.C.

## 2013-07-15 NOTE — Patient Instructions (Signed)
Your physician recommends that you schedule a follow-up appointment in: 3 weeks  Your physician recommends that you return for lab work in: TODAY ( SLIPS GIVEN FOR BMET, TSH)  Your physician has recommended that you wear a holter monitor. Holter monitors are medical devices that record the heart's electrical activity. Doctors most often use these monitors to diagnose arrhythmias. Arrhythmias are problems with the speed or rhythm of the heartbeat. The monitor is a small, portable device. You can wear one while you do your normal daily activities. This is usually used to diagnose what is causing palpitations/syncope (passing out).  WE WILL CALL YOU WITH YOUR TEST RESULTS/INSTRUCTIONS/NEXT STEPS ONCE RECEIVED BY THE PROVIDER  PLEASE BE ADVISED YOU WILL STILL NEED TO KEEP YOUR FOLLOW UP APPOINTMENT TO DISCUSS FURTHER DETAILS OF YOUR TEST RESULTS/NEXT STEPS/FUTURE PLAN OF CARE WITH YOUR PROVIDER DESPITE THE FACT THAT YOU MAY HAVE NORMAL TEST RESULTS

## 2013-07-16 LAB — BASIC METABOLIC PANEL
CO2: 27 mEq/L (ref 19–32)
Calcium: 9.2 mg/dL (ref 8.4–10.5)
Sodium: 140 mEq/L (ref 135–145)

## 2013-07-16 LAB — TSH: TSH: 1.248 u[IU]/mL (ref 0.350–4.500)

## 2013-07-19 ENCOUNTER — Ambulatory Visit (HOSPITAL_COMMUNITY)
Admission: RE | Admit: 2013-07-19 | Discharge: 2013-07-19 | Disposition: A | Discharge status: Home / Self Care | Payer: Medicare Other | Source: Ambulatory Visit | Attending: Cardiology | Admitting: Cardiology

## 2013-07-19 DIAGNOSIS — R002 Palpitations: Secondary | ICD-10-CM | POA: Diagnosis not present

## 2013-07-19 NOTE — Progress Notes (Signed)
48 hour Holter Monitor in progress.  Ends 07-21-13 @ 3:25 .

## 2013-07-26 ENCOUNTER — Other Ambulatory Visit: Payer: Self-pay | Admitting: *Deleted

## 2013-07-26 DIAGNOSIS — R002 Palpitations: Secondary | ICD-10-CM

## 2013-07-28 NOTE — Progress Notes (Signed)
We can change his appt with me for 6 months since he will be seeing Dr Ladona Ridgel soon.

## 2013-08-11 ENCOUNTER — Ambulatory Visit: Payer: BLUE CROSS/BLUE SHIELD | Admitting: Cardiology

## 2013-08-13 ENCOUNTER — Ambulatory Visit (INDEPENDENT_AMBULATORY_CARE_PROVIDER_SITE_OTHER): Payer: Medicare Other | Admitting: Internal Medicine

## 2013-08-13 ENCOUNTER — Encounter: Payer: Self-pay | Admitting: *Deleted

## 2013-08-13 ENCOUNTER — Encounter: Payer: Self-pay | Admitting: Internal Medicine

## 2013-08-13 VITALS — BP 161/83 | HR 56 | Ht 71.0 in | Wt 181.0 lb

## 2013-08-13 DIAGNOSIS — I480 Paroxysmal atrial fibrillation: Secondary | ICD-10-CM

## 2013-08-13 DIAGNOSIS — R002 Palpitations: Secondary | ICD-10-CM

## 2013-08-13 DIAGNOSIS — R5381 Other malaise: Secondary | ICD-10-CM | POA: Diagnosis not present

## 2013-08-13 DIAGNOSIS — I4891 Unspecified atrial fibrillation: Secondary | ICD-10-CM

## 2013-08-13 MED ORDER — FLECAINIDE ACETATE 100 MG PO TABS: 100.0000 mg | ORAL_TABLET | Freq: Two times a day (BID) | ORAL | Status: DC

## 2013-08-13 NOTE — Progress Notes (Signed)
      HPI Greg Mann is referred today for evaluation of palpitations and atrial flutter. He notes palpitations dating back to his days in the [employer]. Over the past several months, he has had increasingly frequent episodes, particularly while exercising. At other times he has problems with dizziness which are not associated with palpitations but upright posture. He has never had syncope. When he experiences palpitations he feels some sob but no chest pain. The episodes last minutes. He tried taking metoprolol but did not experience any improvement. He has warn a cardiac monitor which demonstrates paroxysms of atrial flutter. He is referred for additional evaluation. There may also be some atrial fibrillation on the monitor. He is low risk for stroke.  No Known Allergies   Current Outpatient Prescriptions  Medication Sig Dispense Refill  . aspirin 81 MG tablet Take 81 mg by mouth daily.       Marland Kitchen esomeprazole (NEXIUM) 40 MG capsule Take 40 mg by mouth as needed.       . metoprolol tartrate (LOPRESSOR) 25 MG tablet Take 12.5 mg by mouth 2 (two) times daily.      . Multiple Vitamin (MULTIVITAMIN) tablet Take 1 tablet by mouth daily.        . Tamsulosin HCl (FLOMAX) 0.4 MG CAPS Take 0.4 mg by mouth as needed.        No current facility-administered medications for this visit.     Past Medical History  Diagnosis Date  . Exercise-induced tachycardia   . Ganglion     left wrist  . Chest pain   . Thrombocytopenia     borderline  . Borderline hyperlipidemia     ROS:   All systems reviewed and negative except as noted in the HPI.   Past Surgical History  Procedure Laterality Date  . Knee arthroscopy      Left  . Ganglion cyst excision      Left Wrist  . Tonsillectomy       Family History  Problem Relation Age of Onset  . Aneurysm Father     Aortic  . Coronary artery disease Brother     PCI     History   Social History  . Marital Status: Married    Spouse Name: N/A   Number of Children: N/A  . Years of Education: N/A   Occupational History  . 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 Smoking status: Never Smoker   . Smokeless tobacco: Not on file  . Alcohol Use: Yes     Comment: Occasional wine  . Drug Use: No  . Sexual Activity: Not on file   Other Topics Concern  . Not on file   Social History Narrative   Married, lives locally, 2 adult children   No regular exercise     BP 161/83  Pulse 56  Ht 5\' 11"  (1.803 m)  Wt 181 lb (82.101 kg)  BMI 25.26 kg/m2  Physical Exam:  Well appearing NAD HEENT: Unremarkable Neck:  No JVD, no thyromegally Back:  No CVA tenderness Lungs:  Clear with no wheezes, rales, or rhonchi HEART:  Regular rate rhythm, no murmurs, no rubs, no clicks Abd:  soft, positive bowel sounds, no organomegally, no rebound, no guarding Ext:  2 plus pulses, no edema, no cyanosis, no clubbing Skin:  No rashes no nodules Neuro:  CN II through XII intact, motor grossly intact  EKG- nsr   Assess/Plan:

## 2013-08-13 NOTE — Patient Instructions (Signed)
Your physician recommends that you schedule a follow-up appointment in: 4-6 WEEKS WITH DR Ladona Ridgel  Your physician has requested that you have an exercise tolerance test. For further information please visit [email or web address] Please also follow instruction sheet, as given.  Your physician has recommended you make the following change in your medication:  1. START FLECAINIDE 100 MG TWICE A DAY

## 2013-08-15 ENCOUNTER — Encounter: Payer: Self-pay | Admitting: Internal Medicine

## 2013-08-15 NOTE — Assessment & Plan Note (Signed)
He has both atrial fib and flutter. I have recommended initiation of flecainide as he has symptomatic atrial arrhythmias. He has an extensive list of questions which have all been answered. Will arrange for an exercise test in several weeks.

## 2013-08-15 NOTE — Assessment & Plan Note (Signed)
Hopefully his symptoms will improve with flecainide

## 2013-08-27 ENCOUNTER — Encounter (HOSPITAL_COMMUNITY): Payer: Self-pay

## 2013-08-27 ENCOUNTER — Ambulatory Visit (HOSPITAL_COMMUNITY)
Admission: RE | Admit: 2013-08-27 | Discharge: 2013-08-27 | Disposition: A | Discharge status: Home / Self Care | Payer: Medicare Other | Source: Ambulatory Visit | Attending: Internal Medicine | Admitting: Internal Medicine

## 2013-08-27 DIAGNOSIS — I480 Paroxysmal atrial fibrillation: Secondary | ICD-10-CM

## 2013-08-27 DIAGNOSIS — R002 Palpitations: Secondary | ICD-10-CM

## 2013-08-27 NOTE — Progress Notes (Addendum)
Stress Lab Nurses Notes - Reginal Wojcicki 08/27/2013 Reason for doing test: Palpitation  Type of test: Regular GTX Nurse performing test: Parke Poisson, RN Nuclear Medicine Tech: Not Applicable Echo Tech: Not Applicable MD performing test: Dr.Myka Lukins / K.Lawrence NP Family MD: Dr.Hall Test explained and consent signed: yes IV started: No IV started Symptoms: fatigue in legs Treatment/Intervention: None Reason test stopped: fatigue After recovery IV was: NA Patient to return to Nuc. Med at : NA Patient discharged: Home Patient's Condition upon discharge was: stable Comments: During test peak BP 128/58 & HR 117.  Recovery BP 95/61 & HR 82.  Symptoms resolved in recovery. Continues to have irregular rhythm, atrial flutter. Erskine Speed T  ATTENDING ADDENDUM: The resting ECG showed atrial flutter with variable conduction, HR 74 bpm. With exercise, both atrial fibrillation and atrial flutter were noted. In recovery, both atrial fibrillation and flutter were again noted. The patient had limited exercise tolerance due to leg discomfort and fatigue. There were no diagnostic ST segment abnormalities noted.

## 2013-09-22 ENCOUNTER — Encounter: Payer: Self-pay | Admitting: Internal Medicine

## 2013-09-22 ENCOUNTER — Ambulatory Visit (INDEPENDENT_AMBULATORY_CARE_PROVIDER_SITE_OTHER): Payer: Medicare Other | Admitting: Internal Medicine

## 2013-09-22 VITALS — BP 114/60 | HR 57 | Ht 71.0 in | Wt 184.0 lb

## 2013-09-22 DIAGNOSIS — I10 Essential (primary) hypertension: Secondary | ICD-10-CM | POA: Diagnosis not present

## 2013-09-22 DIAGNOSIS — R002 Palpitations: Secondary | ICD-10-CM

## 2013-09-22 DIAGNOSIS — I4892 Unspecified atrial flutter: Secondary | ICD-10-CM | POA: Diagnosis not present

## 2013-09-22 NOTE — Assessment & Plan Note (Signed)
The patient continues to have some episodes of palpitations since initiation of flecainide. While he was undergoing exercise treadmill testing, he was in atrial flutter. Overall he is improved but still has occasional symptoms. It is uncertain as to whether this represents 100% atrial flutter or both atrial flutter and atrial fibrillation. I discussed 3 options with the patient. One option would be to continue his current medical therapy. A second option would be to try a different antiarrhythmic drug use her amiodarone or dofetilide. A third option would be to proceed with catheter ablation either atrial flutter alone or combination of flutter ablation and fibrillation ablation done at the same setting. The advantages and disadvantages of all 3 approaches were discussed in detail with the patient and his wife. For now, he would like to undergo watchful waiting and continue his current medical therapy. I will see him back in approximately 3 months. If his symptoms increase in frequency and/or severity, he is instructed to call me. I suspect that ultimately he will require catheter ablation of atrial flutter in conjunction with his flecainide therapy.

## 2013-09-22 NOTE — Assessment & Plan Note (Signed)
Please see my note above

## 2013-09-22 NOTE — Progress Notes (Signed)
HPI Mr. Zeis returns today for followup. He is a pleasant 68 yo man with a h/o atrial fibrillation and flutter who was seen by me several weeks ago for evaluation of atrial fibrillation and flutter. He was placed on flecainide and underwent exercise treadmill testing. During his treadmill test, he was in typical atrial flutter with a ventricular rate between 75 and 150 beats per minute. He is been maintained on flecainide since the procedure.  The patient notes occasional episodes of palpitations. He has had no syncope. The episodes start and stop suddenly. They are not daily, but variable in frequency. They may last a few minutes, rarely longer. He denies chest pain or peripheral edema. No Known Allergies   Current Outpatient Prescriptions  Medication Sig Dispense Refill  . aspirin 81 MG tablet Take 81 mg by mouth daily.       Marland Kitchen esomeprazole (NEXIUM) 40 MG capsule Take 40 mg by mouth as needed.       . flecainide (TAMBOCOR) 100 MG tablet Take 1 tablet (100 mg total) by mouth 2 (two) times daily.  180 tablet  3  . metoprolol tartrate (LOPRESSOR) 25 MG tablet Take 12.5 mg by mouth 2 (two) times daily.      . Multiple Vitamin (MULTIVITAMIN) tablet Take 1 tablet by mouth daily.        . Tamsulosin HCl (FLOMAX) 0.4 MG CAPS Take 0.4 mg by mouth as needed.        No current facility-administered medications for this visit.     Past Medical History  Diagnosis Date  . Exercise-induced tachycardia   . Ganglion     left wrist  . Chest pain   . Thrombocytopenia     borderline  . Borderline hyperlipidemia     ROS:   All systems reviewed and negative except as noted in the HPI.   Past Surgical History  Procedure Laterality Date  . Knee arthroscopy      Left  . Ganglion cyst excision      Left Wrist  . Tonsillectomy       Family History  Problem Relation Age of Onset  . Aneurysm Father     Aortic  . Coronary artery disease Brother     PCI     History   Social  History  . Marital Status: Married    Spouse Name: N/A    Number of Children: N/A  . Years of Education: N/A   Occupational History  . 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 Smoking status: Never Smoker   . Smokeless tobacco: Not on file  . Alcohol Use: Yes     Comment: Occasional wine  . Drug Use: No  . Sexual Activity: Not on file   Other Topics Concern  . Not on file   Social History Narrative   Married, lives locally, 2 adult children   No regular exercise     BP 114/60  Pulse 57  Ht 5\' 11"  (1.803 m)  Wt 184 lb (83.462 kg)  BMI 25.67 kg/m2  SpO2 100%  Physical Exam:  Well appearing 68 year old man, NAD HEENT: Unremarkable Neck:  No JVD, no thyromegally Back:  No CVA tenderness Lungs:  Clear with no wheezes, rales, or rhonchi. HEART:  Regular rate rhythm, no murmurs, no rubs, no clicks Abd:  soft, positive bowel sounds, no organomegally, no rebound, no guarding Ext:  2 plus pulses, no edema, no  cyanosis, no clubbing Skin:  No rashes no nodules Neuro:  CN II through XII intact, motor grossly intact  EKG - normal sinus rhythm with nonspecific intraventricular conduction delay, QRS duration 126 ms   Assess/Plan:

## 2013-09-22 NOTE — Patient Instructions (Addendum)
Your physician recommends that you schedule a follow-up appointment in:  3 months with Dr Taylor  Your physician recommends that you continue on your current medications as directed. Please refer to the Current Medication list given to you today.    

## 2013-12-30 ENCOUNTER — Ambulatory Visit: Payer: Medicare Other | Admitting: Internal Medicine

## 2014-01-06 ENCOUNTER — Other Ambulatory Visit: Payer: Self-pay

## 2014-01-06 DIAGNOSIS — R002 Palpitations: Secondary | ICD-10-CM

## 2014-01-06 MED ORDER — METOPROLOL TARTRATE 25 MG PO TABS: 12.5000 mg | ORAL_TABLET | Freq: Two times a day (BID) | ORAL | Status: DC

## 2014-01-28 ENCOUNTER — Encounter: Payer: Self-pay | Admitting: Internal Medicine

## 2014-01-28 ENCOUNTER — Ambulatory Visit (INDEPENDENT_AMBULATORY_CARE_PROVIDER_SITE_OTHER): Payer: Medicare Other | Admitting: Internal Medicine

## 2014-01-28 VITALS — BP 118/70 | HR 46 | Ht 71.0 in | Wt 183.0 lb

## 2014-01-28 DIAGNOSIS — I4892 Unspecified atrial flutter: Secondary | ICD-10-CM | POA: Diagnosis not present

## 2014-01-28 DIAGNOSIS — I4891 Unspecified atrial fibrillation: Secondary | ICD-10-CM | POA: Diagnosis not present

## 2014-01-28 DIAGNOSIS — I480 Paroxysmal atrial fibrillation: Secondary | ICD-10-CM

## 2014-01-28 NOTE — Patient Instructions (Signed)
Your physician recommends that you schedule a follow-up appointment in: 6 months with Dr Taylor You will receive a reminder letter two months in advance reminding you to call and schedule your appointment. If you don't receive this letter, please contact our office.  Your physician recommends that you continue on your current medications as directed. Please refer to the Current Medication list given to you today.   

## 2014-01-28 NOTE — Progress Notes (Signed)
HPI Greg Mann returns today for followup. He is a pleasant 68 yo man with a h/o atrial fibrillation and flutter who was seen by me several months ago for evaluation of atrial fibrillation and flutter. He was placed on flecainide and underwent exercise treadmill testing. During his treadmill test, he was in typical atrial flutter with a ventricular rate between 75 and 150 beats per minute. He is been maintained on flecainide since the procedure.  The patient notes occasional episodes of palpitations as well as lightheadedness. He has had no syncope.  He denies chest pain or peripheral edema. No Known Allergies   Current Outpatient Prescriptions  Medication Sig Dispense Refill  . aspirin 81 MG tablet Take 81 mg by mouth daily.       Marland Kitchen esomeprazole (NEXIUM) 40 MG capsule Take 40 mg by mouth as needed.       . flecainide (TAMBOCOR) 100 MG tablet Take 1 tablet (100 mg total) by mouth 2 (two) times daily.  180 tablet  3  . metoprolol tartrate (LOPRESSOR) 25 MG tablet Take 0.5 tablets (12.5 mg total) by mouth 2 (two) times daily.  30 tablet  3  . Multiple Vitamin (MULTIVITAMIN) tablet Take 1 tablet by mouth daily.        . Tamsulosin HCl (FLOMAX) 0.4 MG CAPS Take 0.4 mg by mouth as needed.        No current facility-administered medications for this visit.     Past Medical History  Diagnosis Date  . Exercise-induced tachycardia   . Ganglion     left wrist  . Chest pain   . Thrombocytopenia     borderline  . Borderline hyperlipidemia     ROS:   All systems reviewed and negative except as noted in the HPI.   Past Surgical History  Procedure Laterality Date  . Knee arthroscopy      Left  . Ganglion cyst excision      Left Wrist  . Tonsillectomy       Family History  Problem Relation Age of Onset  . Aneurysm Father     Aortic  . Coronary artery disease Brother     PCI     History   Social History  . Marital Status: Married    Spouse Name: N/A    Number of  Children: N/A  . Years of Education: N/A   Occupational History  . 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 Smoking status: Never Smoker   . Smokeless tobacco: Not on file  . Alcohol Use: Yes     Comment: Occasional wine  . Drug Use: No  . Sexual Activity: Not on file   Other Topics Concern  . Not on file   Social History Narrative   Married, lives locally, 2 adult children   No regular exercise     BP 118/70  Pulse 46  Ht 5\' 11"  (1.803 m)  Wt 183 lb (83.008 kg)  BMI 25.53 kg/m2  Physical Exam:  Well appearing 68 year old man, NAD HEENT: Unremarkable Neck:  No JVD, no thyromegally Back:  No CVA tenderness Lungs:  Clear with no wheezes, rales, or rhonchi. HEART:  Regular brady rhythm, no murmurs, no rubs, no clicks Abd:  soft, positive bowel sounds, no organomegally, no rebound, no guarding Ext:  2 plus pulses, no edema, no cyanosis, no clubbing Skin:  No rashes no nodules Neuro:  CN II through XII intact,  motor grossly intact  EKG - sinus brady with nonspecific intraventricular conduction delay, QRS duration 126 ms   Assess/Plan:

## 2014-01-28 NOTE — Assessment & Plan Note (Signed)
He appears to be maintaining NSR very nicely. He will continue flecainide and low dose metoprolol.

## 2014-02-10 ENCOUNTER — Telehealth: Payer: Self-pay | Admitting: *Deleted

## 2014-02-10 NOTE — Telephone Encounter (Signed)
error 

## 2014-05-05 ENCOUNTER — Other Ambulatory Visit: Payer: Self-pay | Admitting: Adult Health

## 2014-06-23 DIAGNOSIS — R972 Elevated prostate specific antigen [PSA]: Secondary | ICD-10-CM | POA: Diagnosis not present

## 2014-06-23 DIAGNOSIS — I10 Essential (primary) hypertension: Secondary | ICD-10-CM | POA: Diagnosis not present

## 2014-06-28 DIAGNOSIS — K219 Gastro-esophageal reflux disease without esophagitis: Secondary | ICD-10-CM | POA: Diagnosis not present

## 2014-06-28 DIAGNOSIS — R000 Tachycardia, unspecified: Secondary | ICD-10-CM | POA: Diagnosis not present

## 2014-06-28 DIAGNOSIS — Z23 Encounter for immunization: Secondary | ICD-10-CM | POA: Diagnosis not present

## 2014-06-28 DIAGNOSIS — R7301 Impaired fasting glucose: Secondary | ICD-10-CM | POA: Diagnosis not present

## 2014-08-11 ENCOUNTER — Ambulatory Visit (INDEPENDENT_AMBULATORY_CARE_PROVIDER_SITE_OTHER): Payer: Medicare Other | Admitting: Internal Medicine

## 2014-08-11 ENCOUNTER — Encounter: Payer: Self-pay | Admitting: Internal Medicine

## 2014-08-11 VITALS — BP 110/64 | HR 51 | Ht 71.0 in | Wt 182.0 lb

## 2014-08-11 DIAGNOSIS — I480 Paroxysmal atrial fibrillation: Secondary | ICD-10-CM | POA: Diagnosis not present

## 2014-08-11 DIAGNOSIS — R072 Precordial pain: Secondary | ICD-10-CM

## 2014-08-11 DIAGNOSIS — I4892 Unspecified atrial flutter: Secondary | ICD-10-CM

## 2014-08-11 NOTE — Assessment & Plan Note (Signed)
He is maintaining NSR very nicely on low dose flecainide. He will continue his current meds.

## 2014-08-11 NOTE — Assessment & Plan Note (Signed)
He has had no recurrent symptoms. He will continue his current meds. No additional eval unless he were to develop more symptoms.

## 2014-08-11 NOTE — Progress Notes (Signed)
      HPI [name] returns today for followup. He is a pleasant 68 yo man with a h/o atrial fibrillation and flutter who was seen by me several months ago for evaluation of atrial fibrillation and flutter. He was placed on flecainide and underwent exercise treadmill testing. During his treadmill test, he was in typical atrial flutter with a ventricular rate between 75 and 150 beats per minute. He is been maintained on flecainide since the ablation procedure.  The patient notes occasional episodes of palpitations as well as lightheadedness if he stands up quickly. He has had no syncope.  He denies chest pain or peripheral edema. No Known Allergies   Current Outpatient Prescriptions  Medication Sig Dispense Refill  . aspirin 81 MG tablet Take 81 mg by mouth daily.     Marland Kitchen esomeprazole (NEXIUM) 40 MG capsule Take 40 mg by mouth as needed.     . flecainide (TAMBOCOR) 100 MG tablet Take 1 tablet (100 mg total) by mouth 2 (two) times daily. 180 tablet 3  . metoprolol tartrate (LOPRESSOR) 25 MG tablet TAKE 1/2 TABLET BY MOUTH TWICE DAILY. 30 tablet 9  . Multiple Vitamin (MULTIVITAMIN) tablet Take 1 tablet by mouth daily.       No current facility-administered medications for this visit.     Past Medical History  Diagnosis Date  . Exercise-induced tachycardia   . Ganglion     left wrist  . Chest pain   . Thrombocytopenia     borderline  . Borderline hyperlipidemia     ROS:   All systems reviewed and negative except as noted in the HPI.   Past Surgical History  Procedure Laterality Date  . Knee arthroscopy      Left  . Ganglion cyst excision      Left Wrist  . Tonsillectomy       Family History  Problem Relation Age of Onset  . Aneurysm Father     Aortic  . Coronary artery disease Brother     PCI     History   Social History  . Marital Status: Married    Spouse Name: N/A    Number of Children: N/A  . Years of Education: N/A   Occupational History  . 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 Smoking status: Never Smoker   . Smokeless tobacco: Not on file  . Alcohol Use: Yes     Comment: Occasional wine  . Drug Use: No  . Sexual Activity: Not on file   Other Topics Concern  . Not on file   Social History Narrative   Married, lives locally, 2 adult children   No regular exercise     BP 110/64 mmHg  Pulse 51  Ht 5\' 11"  (1.803 m)  Wt 182 lb (82.555 kg)  BMI 25.40 kg/m2  Physical Exam:  Well appearing 68 year old man, NAD HEENT: Unremarkable Neck:  No JVD, no thyromegally Back:  No CVA tenderness Lungs:  Clear with no wheezes, rales, or rhonchi. HEART:  Regular brady rhythm, no murmurs, no rubs, no clicks Abd:  soft, positive bowel sounds, no organomegally, no rebound, no guarding Ext:  2 plus pulses, no edema, no cyanosis, no clubbing Skin:  No rashes no nodules Neuro:  CN II through XII intact, motor grossly intact  EKG - sinus brady with nonspecific intraventricular conduction delay, QRS duration 118 ms   Assess/Plan:

## 2014-08-11 NOTE — Assessment & Plan Note (Signed)
He is maintaining NSR very nicely since his catheter ablation. No change in meds.

## 2014-08-11 NOTE — Patient Instructions (Signed)
Your physician wants you to follow-up in: 1 YEAR WITH DR TAYLOR You will receive a reminder letter in the mail two months in advance. If you don't receive a letter, please call our office to schedule the follow-up appointment.  Your physician recommends that you continue on your current medications as directed. Please refer to the Current Medication list given to you today.  Thank you for choosing [facility]!!   

## 2014-08-15 ENCOUNTER — Other Ambulatory Visit: Payer: Self-pay | Admitting: Internal Medicine

## 2014-08-15 DIAGNOSIS — I480 Paroxysmal atrial fibrillation: Secondary | ICD-10-CM

## 2014-08-15 DIAGNOSIS — I483 Typical atrial flutter: Secondary | ICD-10-CM

## 2014-08-15 MED ORDER — FLECAINIDE ACETATE 100 MG PO TABS: 100.0000 mg | ORAL_TABLET | Freq: Two times a day (BID) | ORAL | Status: DC

## 2014-08-15 NOTE — Telephone Encounter (Signed)
Received fax refill request  Rx # [id number]  Medication:  Flecainide Acetate 100 mg  Qty 180 Sig:  Take one tablet by mouth twice daily Physician:  Lovena Le

## 2014-08-15 NOTE — Telephone Encounter (Signed)
Refill complete 

## 2014-11-29 DIAGNOSIS — H524 Presbyopia: Secondary | ICD-10-CM | POA: Diagnosis not present

## 2014-11-29 DIAGNOSIS — H52223 Regular astigmatism, bilateral: Secondary | ICD-10-CM | POA: Diagnosis not present

## 2014-11-29 DIAGNOSIS — H5213 Myopia, bilateral: Secondary | ICD-10-CM | POA: Diagnosis not present

## 2014-11-29 DIAGNOSIS — H04129 Dry eye syndrome of unspecified lacrimal gland: Secondary | ICD-10-CM | POA: Diagnosis not present

## 2015-03-16 ENCOUNTER — Other Ambulatory Visit: Payer: Self-pay | Admitting: Internal Medicine

## 2015-05-17 ENCOUNTER — Other Ambulatory Visit: Payer: Self-pay | Admitting: Internal Medicine

## 2015-08-22 ENCOUNTER — Ambulatory Visit (INDEPENDENT_AMBULATORY_CARE_PROVIDER_SITE_OTHER): Payer: Medicare Other | Admitting: Internal Medicine

## 2015-08-22 ENCOUNTER — Encounter: Payer: Self-pay | Admitting: Internal Medicine

## 2015-08-22 VITALS — BP 122/72 | HR 64 | Ht 71.0 in | Wt 189.0 lb

## 2015-08-22 DIAGNOSIS — R002 Palpitations: Secondary | ICD-10-CM | POA: Diagnosis not present

## 2015-08-22 DIAGNOSIS — I480 Paroxysmal atrial fibrillation: Secondary | ICD-10-CM

## 2015-08-22 NOTE — Patient Instructions (Signed)
Your physician wants you to follow-up in: 1 year with Dr. Taylor. You will receive a reminder letter in the mail two months in advance. If you don't receive a letter, please call our office to schedule the follow-up appointment.  Your physician recommends that you continue on your current medications as directed. Please refer to the Current Medication list given to you today.  If you need a refill on your cardiac medications before your next appointment, please call your pharmacy.  Thank you for choosing [facility]!   

## 2015-08-22 NOTE — Progress Notes (Signed)
      HPI Greg Mann returns today for followup. He is a pleasant 69 yo man with a h/o atrial fibrillation and flutter who was seen by me several months ago for evaluation of atrial fibrillation and flutter. He was placed on flecainide and underwent exercise treadmill testing.  The patient notes occasional episodes of palpitations as well as lightheadedness if he stands up quickly. He has had no syncope.  He denies chest pain or peripheral edema. He is still working. No Known Allergies   Current Outpatient Prescriptions  Medication Sig Dispense Refill  . aspirin 81 MG tablet Take 81 mg by mouth daily.     Marland Kitchen esomeprazole (NEXIUM) 40 MG capsule Take 40 mg by mouth as needed.     . flecainide (TAMBOCOR) 100 MG tablet TAKE (1) TABLET BY MOUTH TWICE DAILY. 180 tablet 3  . metoprolol tartrate (LOPRESSOR) 25 MG tablet TAKE 1/2 TABLET BY MOUTH TWICE DAILY. 30 tablet 6  . Multiple Vitamin (MULTIVITAMIN) tablet Take 1 tablet by mouth daily.       No current facility-administered medications for this visit.     Past Medical History  Diagnosis Date  . Exercise-induced tachycardia   . Ganglion     left wrist  . Chest pain   . Thrombocytopenia (HCC)     borderline  . Borderline hyperlipidemia     ROS:   All systems reviewed and negative except as noted in the HPI.   Past Surgical History  Procedure Laterality Date  . Knee arthroscopy      Left  . Ganglion cyst excision      Left Wrist  . Tonsillectomy       Family History  Problem Relation Age of Onset  . Aneurysm Father     Aortic  . Coronary artery disease Brother     PCI     Social History   Social History  . Marital Status: Married    Spouse Name: N/A  . Number of Children: N/A  . Years of Education: N/A   Occupational History  . 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 Smoking status: Never Smoker   . Smokeless tobacco: Never Used  . Alcohol Use: 0.0  oz/week    0 Standard drinks or equivalent per week     Comment: Occasional wine  . Drug Use: No  . Sexual Activity: Not on file   Other Topics Concern  . Not on file   Social History Narrative   Married, lives locally, 2 adult children   No regular exercise     BP 122/72 mmHg  Pulse 64  Ht 5\' 11"  (1.803 m)  Wt 189 lb (85.73 kg)  BMI 26.37 kg/m2  SpO2 97%  Physical Exam:  Well appearing 69 year old man, NAD HEENT: Unremarkable Neck:  6 cm JVD, no thyromegally Back:  No CVA tenderness Lungs:  Clear with no wheezes, rales, or rhonchi. HEART:  Regular rate and rhythm, no murmurs, no rubs, no clicks Abd:  soft, positive bowel sounds, no organomegally, no rebound, no guarding Ext:  2 plus pulses, no edema, no cyanosis, no clubbing Skin:  No rashes no nodules Neuro:  CN II through XII intact, motor grossly intact  EKG - sinus brady with nonspecific intraventricular conduction delay, QRS duration 114 ms   Assess/Plan:

## 2015-08-22 NOTE — Assessment & Plan Note (Signed)
These are very infrequent. I instructed him to take an additional flecainide if he feels his heart racing.

## 2015-08-22 NOTE — Assessment & Plan Note (Signed)
He is maintaining NSR very nicely on flecainide. He will continue his current meds.

## 2015-10-05 DIAGNOSIS — Z125 Encounter for screening for malignant neoplasm of prostate: Secondary | ICD-10-CM | POA: Diagnosis not present

## 2015-10-05 DIAGNOSIS — Z0000 Encounter for general adult medical examination without abnormal findings: Secondary | ICD-10-CM | POA: Diagnosis not present

## 2015-10-05 DIAGNOSIS — E782 Mixed hyperlipidemia: Secondary | ICD-10-CM | POA: Diagnosis not present

## 2015-10-05 DIAGNOSIS — R7301 Impaired fasting glucose: Secondary | ICD-10-CM | POA: Diagnosis not present

## 2015-10-10 DIAGNOSIS — R7301 Impaired fasting glucose: Secondary | ICD-10-CM | POA: Diagnosis not present

## 2015-10-10 DIAGNOSIS — R944 Abnormal results of kidney function studies: Secondary | ICD-10-CM | POA: Diagnosis not present

## 2015-10-10 DIAGNOSIS — D696 Thrombocytopenia, unspecified: Secondary | ICD-10-CM | POA: Diagnosis not present

## 2015-10-10 DIAGNOSIS — R002 Palpitations: Secondary | ICD-10-CM | POA: Diagnosis not present

## 2015-10-10 DIAGNOSIS — K219 Gastro-esophageal reflux disease without esophagitis: Secondary | ICD-10-CM | POA: Diagnosis not present

## 2015-10-10 DIAGNOSIS — N529 Male erectile dysfunction, unspecified: Secondary | ICD-10-CM | POA: Diagnosis not present

## 2015-10-10 DIAGNOSIS — R000 Tachycardia, unspecified: Secondary | ICD-10-CM | POA: Diagnosis not present

## 2015-10-10 DIAGNOSIS — K5900 Constipation, unspecified: Secondary | ICD-10-CM | POA: Diagnosis not present

## 2015-10-10 DIAGNOSIS — Z23 Encounter for immunization: Secondary | ICD-10-CM | POA: Diagnosis not present

## 2015-10-20 ENCOUNTER — Other Ambulatory Visit: Payer: Self-pay | Admitting: Internal Medicine

## 2016-04-08 DIAGNOSIS — R7301 Impaired fasting glucose: Secondary | ICD-10-CM | POA: Diagnosis not present

## 2016-04-08 DIAGNOSIS — E782 Mixed hyperlipidemia: Secondary | ICD-10-CM | POA: Diagnosis not present

## 2016-04-10 DIAGNOSIS — D696 Thrombocytopenia, unspecified: Secondary | ICD-10-CM | POA: Diagnosis not present

## 2016-04-10 DIAGNOSIS — N529 Male erectile dysfunction, unspecified: Secondary | ICD-10-CM | POA: Diagnosis not present

## 2016-04-10 DIAGNOSIS — K5900 Constipation, unspecified: Secondary | ICD-10-CM | POA: Diagnosis not present

## 2016-04-10 DIAGNOSIS — R7301 Impaired fasting glucose: Secondary | ICD-10-CM | POA: Diagnosis not present

## 2016-04-10 DIAGNOSIS — R944 Abnormal results of kidney function studies: Secondary | ICD-10-CM | POA: Diagnosis not present

## 2016-04-10 DIAGNOSIS — R000 Tachycardia, unspecified: Secondary | ICD-10-CM | POA: Diagnosis not present

## 2016-04-10 DIAGNOSIS — K219 Gastro-esophageal reflux disease without esophagitis: Secondary | ICD-10-CM | POA: Diagnosis not present

## 2016-05-07 ENCOUNTER — Other Ambulatory Visit: Payer: Self-pay

## 2016-05-17 ENCOUNTER — Other Ambulatory Visit: Payer: Self-pay | Admitting: Internal Medicine

## 2016-08-19 ENCOUNTER — Other Ambulatory Visit: Payer: Self-pay | Admitting: Internal Medicine

## 2016-09-16 ENCOUNTER — Emergency Department (HOSPITAL_COMMUNITY)
Admission: EM | Admit: 2016-09-16 | Discharge: 2016-09-16 | Disposition: A | Discharge status: Home / Self Care | Payer: Medicare Other | Attending: Emergency Medicine | Admitting: Emergency Medicine

## 2016-09-16 ENCOUNTER — Emergency Department (HOSPITAL_COMMUNITY): Payer: Medicare Other

## 2016-09-16 ENCOUNTER — Encounter (HOSPITAL_COMMUNITY): Payer: Self-pay | Admitting: Emergency Medicine

## 2016-09-16 DIAGNOSIS — W228XXA Striking against or struck by other objects, initial encounter: Secondary | ICD-10-CM | POA: Insufficient documentation

## 2016-09-16 DIAGNOSIS — R2242 Localized swelling, mass and lump, left lower limb: Secondary | ICD-10-CM | POA: Diagnosis not present

## 2016-09-16 DIAGNOSIS — Z79899 Other long term (current) drug therapy: Secondary | ICD-10-CM | POA: Insufficient documentation

## 2016-09-16 DIAGNOSIS — Y999 Unspecified external cause status: Secondary | ICD-10-CM | POA: Diagnosis not present

## 2016-09-16 DIAGNOSIS — Y929 Unspecified place or not applicable: Secondary | ICD-10-CM | POA: Insufficient documentation

## 2016-09-16 DIAGNOSIS — S8992XA Unspecified injury of left lower leg, initial encounter: Secondary | ICD-10-CM | POA: Diagnosis present

## 2016-09-16 DIAGNOSIS — Y9389 Activity, other specified: Secondary | ICD-10-CM | POA: Insufficient documentation

## 2016-09-16 DIAGNOSIS — Z791 Long term (current) use of non-steroidal anti-inflammatories (NSAID): Secondary | ICD-10-CM | POA: Diagnosis not present

## 2016-09-16 DIAGNOSIS — M79662 Pain in left lower leg: Secondary | ICD-10-CM | POA: Diagnosis not present

## 2016-09-16 DIAGNOSIS — M7989 Other specified soft tissue disorders: Secondary | ICD-10-CM

## 2016-09-16 DIAGNOSIS — Z7982 Long term (current) use of aspirin: Secondary | ICD-10-CM | POA: Diagnosis not present

## 2016-09-16 HISTORY — DX: Gastro-esophageal reflux disease without esophagitis: K21.9

## 2016-09-16 NOTE — ED Triage Notes (Signed)
PT stated he took his grandkids sledding and got hit by a sled on his left lower leg on 09/04/16 and since then has had some discoloration/swelling and tightness that extending from his calf and to his foot.

## 2016-09-16 NOTE — ED Notes (Signed)
Pt back from X-ray.  

## 2016-09-16 NOTE — ED Notes (Signed)
Pt taken to Xray.

## 2016-09-18 NOTE — ED Provider Notes (Signed)
[name] DEPT Provider Note   CSN: [id number] Arrival date & time: 09/16/16  0915     History   Chief Complaint Chief Complaint  Patient presents with  . Leg Swelling    HPI Greg Mann is a 71 y.o. male presenting with persistent pain at his anterior left mid tibia since he was struck by a sled while playing in the snow with grandkids the week after Christmas.  He presents today due to an episode yesterday of swelling also in the left lower extremity which is a new finding.  He stood more than normal yesterday attending a funeral, then had a 4 hour drive home from this event, after which he had significant swelling in the left lower leg and ankle which improved over night with elevation.  He denies prior history of similar symptoms.  He denies fevers, chills, skin changes, shortness of breath, palpitations and chest pain.   The history is provided by the patient.    Past Medical History:  Diagnosis Date  . Borderline hyperlipidemia   . Chest pain   . Exercise-induced tachycardia   . Ganglion    left wrist  . GERD (gastroesophageal reflux disease)   . Thrombocytopenia ([place])    borderline    Patient Active Problem List   Diagnosis Date Noted  . Atrial flutter ([place]) 09/22/2013  . Paroxysmal atrial fibrillation ([place]) 04/23/2013  . Rapid palpitations 02/26/2013  . FATIGUE 10/24/2009  . THROMBOCYTOPENIA 10/11/2009  . Chest pain 10/11/2009    Past Surgical History:  Procedure Laterality Date  . GANGLION CYST EXCISION     Left Wrist  . KNEE ARTHROSCOPY     Left  . TONSILLECTOMY         Home Medications    Prior to Admission medications   Medication Sig Start Date End Date Taking? Authorizing Provider  aspirin EC 81 MG tablet Take 81 mg by mouth daily.   Yes Historical Provider, MD  flecainide (TAMBOCOR) 100 MG tablet TAKE (1) TABLET BY MOUTH TWICE DAILY. 08/19/16  Yes Evans Lance, MD  ibuprofen (ADVIL,MOTRIN) 200 MG tablet Take 400 mg by mouth every 8  (eight) hours as needed for mild pain.   Yes Historical Provider, MD  metoprolol tartrate (LOPRESSOR) 25 MG tablet TAKE 1/2 TABLET BY MOUTH TWICE DAILY. 08/19/16  Yes Evans Lance, MD  Multiple Vitamin (MULTIVITAMIN) tablet Take 1 tablet by mouth daily.     Yes Historical Provider, MD    Family History Family History  Problem Relation Age of Onset  . Aneurysm Father     Aortic  . Coronary artery disease Brother     PCI    Social History Social History  Substance Use Topics  . Smoking status: Never Smoker  . Smokeless tobacco: Never Used  . Alcohol use 0.0 oz/week     Comment: Occasional wine     Allergies   Patient has no known allergies.   Review of Systems Review of Systems  Constitutional: Negative for fever.  HENT: Negative for congestion and sore throat.   Eyes: Negative.   Respiratory: Negative for chest tightness and shortness of breath.   Cardiovascular: Negative for chest pain and palpitations.  Gastrointestinal: Negative for abdominal pain and nausea.  Genitourinary: Negative.   Musculoskeletal: Positive for arthralgias. Negative for joint swelling and neck pain.       Negative except as mentioned in HPI.   Skin: Negative.  Negative for color change, rash and wound.  Neurological: Negative for  dizziness, weakness, light-headedness, numbness and headaches.  Psychiatric/Behavioral: Negative.      Physical Exam Updated Vital Signs BP 128/74 (BP Location: Right Arm)   Pulse (!) 56   Temp 98.3 F (36.8 C) (Oral)   Resp 18   Ht 5\' 11"  (1.803 m)   Wt 79.4 kg   SpO2 100%   BMI 24.41 kg/m   Physical Exam  Constitutional: He appears well-developed and well-nourished.  HENT:  Head: Normocephalic and atraumatic.  Eyes: Conjunctivae are normal.  Neck: Normal range of motion.  Cardiovascular: Normal rate, regular rhythm, normal heart sounds and intact distal pulses.   Pulses:      Dorsalis pedis pulses are 2+ on the right side, and 2+ on the left side.   Pulmonary/Chest: Effort normal and breath sounds normal. He has no wheezes.  Abdominal: Soft. Bowel sounds are normal. There is no tenderness.  Musculoskeletal: Normal range of motion. He exhibits tenderness. He exhibits no edema or deformity.       Legs: Tender mid tibia with subtle palpable indentation at the site of the blow.  No calf tenderness, no cords, negative Homan's sign.  No ankle edema.   Neurological: He is alert.  Skin: Skin is warm and dry.  Psychiatric: He has a normal mood and affect.  Nursing note and vitals reviewed.    ED Treatments / Results  Labs (all labs ordered are listed, but only abnormal results are displayed) Labs Reviewed - No data to display     Radiology  Results for orders placed or performed in visit on [id number]  Basic metabolic panel  Result Value Ref Range   Sodium 140 135 - 145 mEq/L   Potassium 3.9 3.5 - 5.3 mEq/L   Chloride 103 96 - 112 mEq/L   CO2 27 19 - 32 mEq/L   Glucose, Bld 84 70 - 99 mg/dL   BUN 15 6 - 23 mg/dL   Creat 0.98 0.50 - 1.35 mg/dL   Calcium 9.2 8.4 - 10.5 mg/dL  TSH  Result Value Ref Range   TSH 1.248 0.350 - 4.500 uIU/mL   Dg Tibia/fibula Left  Result Date: 09/16/2016 CLINICAL DATA:  Persistent pain and swelling. EXAM: LEFT TIBIA AND FIBULA - 2 VIEW COMPARISON:  None. FINDINGS: There is no evidence of fracture or other focal bone lesions. Mild osteoarthritis of the medial femorotibial compartment. Soft tissues are unremarkable. IMPRESSION: No acute osseous injury of the left tibia and fibula. Electronically Signed   By: Kathreen Devoid   On: 09/16/2016 11:43   US Venous Img Lower Unilateral Left  Result Date: 09/16/2016 CLINICAL DATA:  71 year old male with a history of pain and swelling EXAM: LEFT LOWER EXTREMITY VENOUS DOPPLER ULTRASOUND TECHNIQUE: Gray-scale sonography with graded compression, as well as color Doppler and duplex ultrasound were performed to evaluate the lower extremity deep venous systems from the  level of the common femoral vein and including the common femoral, femoral, profunda femoral, popliteal and calf veins including the posterior tibial, peroneal and gastrocnemius veins when visible. The superficial great saphenous vein was also interrogated. Spectral Doppler was utilized to evaluate flow at rest and with distal augmentation maneuvers in the common femoral, femoral and popliteal veins. COMPARISON:  None. FINDINGS: Contralateral Common Femoral Vein: Respiratory phasicity is normal and symmetric with the symptomatic side. No evidence of thrombus. Normal compressibility. Common Femoral Vein: No evidence of thrombus. Normal compressibility, respiratory phasicity and response to augmentation. Saphenofemoral Junction: No evidence of thrombus. Normal compressibility and flow on  color Doppler imaging. Profunda Femoral Vein: No evidence of thrombus. Normal compressibility and flow on color Doppler imaging. Femoral Vein: No evidence of thrombus. Normal compressibility, respiratory phasicity and response to augmentation. Popliteal Vein: No evidence of thrombus. Normal compressibility, respiratory phasicity and response to augmentation. Calf Veins: No evidence of thrombus. Normal compressibility and flow on color Doppler imaging. Superficial Great Saphenous Vein: No evidence of thrombus. Normal compressibility and flow on color Doppler imaging. Other Findings:  None. IMPRESSION: Sonographic survey of the left lower extremity negative for DVT. Signed, Dulcy Fanny. Earleen Newport, DO [facility] Electronically Signed   By: Corrie Mckusick D.O.   On: 09/16/2016 11:32     Procedures Procedures (including critical care time)  Medications Ordered in ED Medications - No data to display   Initial Impression / Assessment and Plan / ED Course  I have reviewed the triage vital signs and the nursing notes.  Pertinent labs & imaging results that were available during  my care of the patient were reviewed by me and considered in my medical decision making (see chart for details).  Clinical Course     Pt with transient left lower extremity swelling, improved s/p travel and with recent anterior leg trauma.  No findings per imaging and [place]. Negative for dvt. Calf is soft, doubt compartment syndrome.  Pulses in foot good with no vascular compromise noted. Advised continued elevation, warm compresses for any further episodes of edema.  Plan f/uw with pcp prn if sx return or persist.  The patient appears reasonably screened and/or stabilized for discharge and I doubt any other medical condition or other [facility] requiring further screening, evaluation, or treatment in the ED at this time prior to discharge.  Pt was seen by Dr. Wilson Singer prior to dc home.  Final Clinical Impressions(s) / ED Diagnoses   Final diagnoses:  Leg swelling    New Prescriptions Discharge Medication List as of 09/16/2016  1:11 PM       Evalee Jefferson, PA-C 09/18/16 [place], PA-C 09/18/16 1224    Virgel Manifold, MD 09/20/16 [phone]

## 2016-10-09 DIAGNOSIS — E782 Mixed hyperlipidemia: Secondary | ICD-10-CM | POA: Diagnosis not present

## 2016-10-09 DIAGNOSIS — R7301 Impaired fasting glucose: Secondary | ICD-10-CM | POA: Diagnosis not present

## 2016-10-11 DIAGNOSIS — R002 Palpitations: Secondary | ICD-10-CM | POA: Diagnosis not present

## 2016-10-11 DIAGNOSIS — R7301 Impaired fasting glucose: Secondary | ICD-10-CM | POA: Diagnosis not present

## 2016-10-11 DIAGNOSIS — Z0000 Encounter for general adult medical examination without abnormal findings: Secondary | ICD-10-CM | POA: Diagnosis not present

## 2016-10-11 DIAGNOSIS — N529 Male erectile dysfunction, unspecified: Secondary | ICD-10-CM | POA: Diagnosis not present

## 2016-10-11 DIAGNOSIS — K219 Gastro-esophageal reflux disease without esophagitis: Secondary | ICD-10-CM | POA: Diagnosis not present

## 2016-10-11 DIAGNOSIS — K5900 Constipation, unspecified: Secondary | ICD-10-CM | POA: Diagnosis not present

## 2016-10-11 DIAGNOSIS — R000 Tachycardia, unspecified: Secondary | ICD-10-CM | POA: Diagnosis not present

## 2016-10-31 ENCOUNTER — Encounter: Payer: Self-pay | Admitting: Internal Medicine

## 2016-10-31 ENCOUNTER — Ambulatory Visit (INDEPENDENT_AMBULATORY_CARE_PROVIDER_SITE_OTHER): Payer: Medicare Other | Admitting: Internal Medicine

## 2016-10-31 VITALS — BP 116/68 | HR 66 | Ht 71.0 in | Wt 187.0 lb

## 2016-10-31 DIAGNOSIS — I480 Paroxysmal atrial fibrillation: Secondary | ICD-10-CM | POA: Diagnosis not present

## 2016-10-31 NOTE — Patient Instructions (Signed)
Your physician wants you to follow-up in: 6 Months with Dr. Lovena Le. You will receive a reminder letter in the mail two months in advance. If you don't receive a letter, please call our office to schedule the follow-up appointment.  Your physician has recommended you make the following change in your medication:   STOP Taking Lopressor   If you need a refill on your cardiac medications before your next appointment, please call your pharmacy.  Thank you for choosing [facility]!

## 2016-10-31 NOTE — Progress Notes (Signed)
HPI Greg Mann returns today for followup. He is a pleasant 71 yo man with a h/o atrial fibrillation and flutter who was seen by me over a year ago for ongoing treatment of atrial fibrillation and flutter.  The patient notes occasional episodes of palpitations as well as lightheadedness if he stands up quickly. He feels like his heart does not speed up fast enough. He has had no syncope.  He denies chest pain or peripheral edema. He is still working.  No Known Allergies   Current Outpatient Prescriptions  Medication Sig Dispense Refill  . aspirin EC 81 MG tablet Take 81 mg by mouth daily.    . flecainide (TAMBOCOR) 100 MG tablet TAKE (1) TABLET BY MOUTH TWICE DAILY. 180 tablet 4  . ibuprofen (ADVIL,MOTRIN) 200 MG tablet Take 400 mg by mouth every 8 (eight) hours as needed for mild pain.    . metoprolol tartrate (LOPRESSOR) 25 MG tablet TAKE 1/2 TABLET BY MOUTH TWICE DAILY. 30 tablet 6  . Multiple Vitamin (MULTIVITAMIN) tablet Take 1 tablet by mouth daily.       No current facility-administered medications for this visit.      Past Medical History:  Diagnosis Date  . Borderline hyperlipidemia   . Chest pain   . Exercise-induced tachycardia   . Ganglion    left wrist  . GERD (gastroesophageal reflux disease)   . Thrombocytopenia ([place])    borderline    ROS:   All systems reviewed and negative except as noted in the HPI.   Past Surgical History:  Procedure Laterality Date  . GANGLION CYST EXCISION     Left Wrist  . KNEE ARTHROSCOPY     Left  . TONSILLECTOMY       Family History  Problem Relation Age of Onset  . Aneurysm Father     Aortic  . Coronary artery disease Brother     PCI     Social History   Social History  . Marital status: Married    Spouse name: N/A  . Number of children: N/A  . Years of education: N/A   Occupational History  . 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 Smoking status:  Never Smoker  . Smokeless tobacco: Never Used  . Alcohol use 0.0 oz/week     Comment: Occasional wine  . Drug use: No  . Sexual activity: Not on file   Other Topics Concern  . Not on file   Social History Narrative   Married, lives locally, 2 adult children   No regular exercise     BP 116/68   Pulse 66   Ht 5\' 11"  (1.803 m)   Wt 187 lb (84.8 kg)   SpO2 96%   BMI 26.08 kg/m   Physical Exam:  Well appearing 71 year old man, NAD HEENT: Unremarkable Neck:  6 cm JVD, no thyromegally Back:  No CVA tenderness Lungs:  Clear with no wheezes, rales, or rhonchi. HEART:  Regular rate and rhythm, no murmurs, no rubs, no clicks Abd:  soft, positive bowel sounds, no organomegally, no rebound, no guarding Ext:  2 plus pulses, no edema, no cyanosis, no clubbing Skin:  No rashes no nodules Neuro:  CN II through XII intact, motor grossly intact  EKG - sinus brady with nonspecific intraventricular conduction delay, QRS duration 120 ms   Assess/Plan: 1. PAF - he is maintaining NSR very nicely. He will continue flecainide. 2. Dizziness -  sounds like he may have some chronotropic incompetence. Will ask him to hold his metoprolol and see how he does. 3. Dyslipidemia - he is on dietary therapy alone.   Mikle Bosworth.D.

## 2016-12-18 DIAGNOSIS — M542 Cervicalgia: Secondary | ICD-10-CM | POA: Diagnosis not present

## 2016-12-18 DIAGNOSIS — M9901 Segmental and somatic dysfunction of cervical region: Secondary | ICD-10-CM | POA: Diagnosis not present

## 2016-12-19 DIAGNOSIS — M9901 Segmental and somatic dysfunction of cervical region: Secondary | ICD-10-CM | POA: Diagnosis not present

## 2016-12-19 DIAGNOSIS — M542 Cervicalgia: Secondary | ICD-10-CM | POA: Diagnosis not present

## 2016-12-20 DIAGNOSIS — M9901 Segmental and somatic dysfunction of cervical region: Secondary | ICD-10-CM | POA: Diagnosis not present

## 2016-12-20 DIAGNOSIS — M542 Cervicalgia: Secondary | ICD-10-CM | POA: Diagnosis not present

## 2016-12-23 DIAGNOSIS — M9901 Segmental and somatic dysfunction of cervical region: Secondary | ICD-10-CM | POA: Diagnosis not present

## 2016-12-23 DIAGNOSIS — M542 Cervicalgia: Secondary | ICD-10-CM | POA: Diagnosis not present

## 2016-12-25 DIAGNOSIS — M9901 Segmental and somatic dysfunction of cervical region: Secondary | ICD-10-CM | POA: Diagnosis not present

## 2016-12-25 DIAGNOSIS — M542 Cervicalgia: Secondary | ICD-10-CM | POA: Diagnosis not present

## 2016-12-27 DIAGNOSIS — M542 Cervicalgia: Secondary | ICD-10-CM | POA: Diagnosis not present

## 2016-12-27 DIAGNOSIS — M9901 Segmental and somatic dysfunction of cervical region: Secondary | ICD-10-CM | POA: Diagnosis not present

## 2016-12-31 DIAGNOSIS — M9901 Segmental and somatic dysfunction of cervical region: Secondary | ICD-10-CM | POA: Diagnosis not present

## 2016-12-31 DIAGNOSIS — M542 Cervicalgia: Secondary | ICD-10-CM | POA: Diagnosis not present

## 2017-01-02 DIAGNOSIS — M9901 Segmental and somatic dysfunction of cervical region: Secondary | ICD-10-CM | POA: Diagnosis not present

## 2017-01-02 DIAGNOSIS — M542 Cervicalgia: Secondary | ICD-10-CM | POA: Diagnosis not present

## 2017-01-06 DIAGNOSIS — M9901 Segmental and somatic dysfunction of cervical region: Secondary | ICD-10-CM | POA: Diagnosis not present

## 2017-01-06 DIAGNOSIS — M542 Cervicalgia: Secondary | ICD-10-CM | POA: Diagnosis not present

## 2017-01-08 DIAGNOSIS — M9901 Segmental and somatic dysfunction of cervical region: Secondary | ICD-10-CM | POA: Diagnosis not present

## 2017-01-08 DIAGNOSIS — M542 Cervicalgia: Secondary | ICD-10-CM | POA: Diagnosis not present

## 2017-01-21 DIAGNOSIS — M542 Cervicalgia: Secondary | ICD-10-CM | POA: Diagnosis not present

## 2017-01-21 DIAGNOSIS — M9901 Segmental and somatic dysfunction of cervical region: Secondary | ICD-10-CM | POA: Diagnosis not present

## 2017-01-23 DIAGNOSIS — M9901 Segmental and somatic dysfunction of cervical region: Secondary | ICD-10-CM | POA: Diagnosis not present

## 2017-01-23 DIAGNOSIS — M542 Cervicalgia: Secondary | ICD-10-CM | POA: Diagnosis not present

## 2017-01-27 DIAGNOSIS — M542 Cervicalgia: Secondary | ICD-10-CM | POA: Diagnosis not present

## 2017-01-27 DIAGNOSIS — M9901 Segmental and somatic dysfunction of cervical region: Secondary | ICD-10-CM | POA: Diagnosis not present

## 2017-01-30 DIAGNOSIS — M9901 Segmental and somatic dysfunction of cervical region: Secondary | ICD-10-CM | POA: Diagnosis not present

## 2017-01-30 DIAGNOSIS — M542 Cervicalgia: Secondary | ICD-10-CM | POA: Diagnosis not present

## 2017-02-05 DIAGNOSIS — M542 Cervicalgia: Secondary | ICD-10-CM | POA: Diagnosis not present

## 2017-02-05 DIAGNOSIS — M9901 Segmental and somatic dysfunction of cervical region: Secondary | ICD-10-CM | POA: Diagnosis not present

## 2017-02-10 DIAGNOSIS — M9901 Segmental and somatic dysfunction of cervical region: Secondary | ICD-10-CM | POA: Diagnosis not present

## 2017-02-10 DIAGNOSIS — M542 Cervicalgia: Secondary | ICD-10-CM | POA: Diagnosis not present

## 2017-02-13 DIAGNOSIS — M542 Cervicalgia: Secondary | ICD-10-CM | POA: Diagnosis not present

## 2017-02-13 DIAGNOSIS — M9901 Segmental and somatic dysfunction of cervical region: Secondary | ICD-10-CM | POA: Diagnosis not present

## 2017-02-17 DIAGNOSIS — M9901 Segmental and somatic dysfunction of cervical region: Secondary | ICD-10-CM | POA: Diagnosis not present

## 2017-02-17 DIAGNOSIS — M542 Cervicalgia: Secondary | ICD-10-CM | POA: Diagnosis not present

## 2017-02-20 DIAGNOSIS — M542 Cervicalgia: Secondary | ICD-10-CM | POA: Diagnosis not present

## 2017-02-20 DIAGNOSIS — M9901 Segmental and somatic dysfunction of cervical region: Secondary | ICD-10-CM | POA: Diagnosis not present

## 2017-02-24 DIAGNOSIS — M542 Cervicalgia: Secondary | ICD-10-CM | POA: Diagnosis not present

## 2017-02-24 DIAGNOSIS — M9901 Segmental and somatic dysfunction of cervical region: Secondary | ICD-10-CM | POA: Diagnosis not present

## 2017-02-27 DIAGNOSIS — M542 Cervicalgia: Secondary | ICD-10-CM | POA: Diagnosis not present

## 2017-02-27 DIAGNOSIS — M9901 Segmental and somatic dysfunction of cervical region: Secondary | ICD-10-CM | POA: Diagnosis not present

## 2017-03-03 DIAGNOSIS — M542 Cervicalgia: Secondary | ICD-10-CM | POA: Diagnosis not present

## 2017-03-03 DIAGNOSIS — M9901 Segmental and somatic dysfunction of cervical region: Secondary | ICD-10-CM | POA: Diagnosis not present

## 2017-03-06 DIAGNOSIS — M542 Cervicalgia: Secondary | ICD-10-CM | POA: Diagnosis not present

## 2017-03-06 DIAGNOSIS — M9901 Segmental and somatic dysfunction of cervical region: Secondary | ICD-10-CM | POA: Diagnosis not present

## 2017-03-13 DIAGNOSIS — M542 Cervicalgia: Secondary | ICD-10-CM | POA: Diagnosis not present

## 2017-03-13 DIAGNOSIS — M9901 Segmental and somatic dysfunction of cervical region: Secondary | ICD-10-CM | POA: Diagnosis not present

## 2017-03-19 DIAGNOSIS — M542 Cervicalgia: Secondary | ICD-10-CM | POA: Diagnosis not present

## 2017-03-19 DIAGNOSIS — M9901 Segmental and somatic dysfunction of cervical region: Secondary | ICD-10-CM | POA: Diagnosis not present

## 2017-03-20 ENCOUNTER — Other Ambulatory Visit: Payer: Self-pay | Admitting: Internal Medicine

## 2017-03-24 DIAGNOSIS — M9901 Segmental and somatic dysfunction of cervical region: Secondary | ICD-10-CM | POA: Diagnosis not present

## 2017-03-24 DIAGNOSIS — M542 Cervicalgia: Secondary | ICD-10-CM | POA: Diagnosis not present

## 2017-03-31 DIAGNOSIS — M9901 Segmental and somatic dysfunction of cervical region: Secondary | ICD-10-CM | POA: Diagnosis not present

## 2017-03-31 DIAGNOSIS — M542 Cervicalgia: Secondary | ICD-10-CM | POA: Diagnosis not present

## 2017-04-07 DIAGNOSIS — M9901 Segmental and somatic dysfunction of cervical region: Secondary | ICD-10-CM | POA: Diagnosis not present

## 2017-04-07 DIAGNOSIS — M542 Cervicalgia: Secondary | ICD-10-CM | POA: Diagnosis not present

## 2017-04-14 DIAGNOSIS — M542 Cervicalgia: Secondary | ICD-10-CM | POA: Diagnosis not present

## 2017-04-14 DIAGNOSIS — M9901 Segmental and somatic dysfunction of cervical region: Secondary | ICD-10-CM | POA: Diagnosis not present

## 2017-04-25 ENCOUNTER — Other Ambulatory Visit: Payer: Self-pay | Admitting: Internal Medicine

## 2017-04-28 DIAGNOSIS — M9901 Segmental and somatic dysfunction of cervical region: Secondary | ICD-10-CM | POA: Diagnosis not present

## 2017-04-28 DIAGNOSIS — M542 Cervicalgia: Secondary | ICD-10-CM | POA: Diagnosis not present

## 2017-05-05 DIAGNOSIS — M9901 Segmental and somatic dysfunction of cervical region: Secondary | ICD-10-CM | POA: Diagnosis not present

## 2017-05-05 DIAGNOSIS — M542 Cervicalgia: Secondary | ICD-10-CM | POA: Diagnosis not present

## 2017-05-08 ENCOUNTER — Encounter: Payer: Self-pay | Admitting: Internal Medicine

## 2017-05-08 ENCOUNTER — Ambulatory Visit (INDEPENDENT_AMBULATORY_CARE_PROVIDER_SITE_OTHER): Payer: Medicare Other | Admitting: Internal Medicine

## 2017-05-08 VITALS — BP 118/66 | HR 67 | Ht 71.0 in | Wt 186.0 lb

## 2017-05-08 DIAGNOSIS — I480 Paroxysmal atrial fibrillation: Secondary | ICD-10-CM | POA: Diagnosis not present

## 2017-05-08 NOTE — Patient Instructions (Signed)
Medication Instructions:  Your physician recommends that you continue on your current medications as directed. Please refer to the Current Medication list given to you today.   Labwork: NONE   Testing/Procedures: NONE   Follow-Up: Your physician wants you to follow-up in: 1 Year with Dr. Taylor. You will receive a reminder letter in the mail two months in advance. If you don't receive a letter, please call our office to schedule the follow-up appointment.   Any Other Special Instructions Will Be Listed Below (If Applicable).     If you need a refill on your cardiac medications before your next appointment, please call your pharmacy.  Thank you for choosing [facility]!   

## 2017-05-08 NOTE — Progress Notes (Signed)
HPI Mr. Novacek is returns today for ongoing evaluation of paroxysmal atrial fibrillation. He is a pleasant 71 year old man who still works. The patient has had paroxysms of atrial fibrillation for several years which have been well controlled with flecainide therapy. When I last saw the patient, he complained of some fatigue and dizziness, particularly with exertion. We stopped his beta blocker. His symptoms have improved. He denies palpitations, chest pain, shortness of breath, or syncope. No peripheral edema. His energy level is increased with discontinuation of his beta blocker. No Known Allergies   Current Outpatient Prescriptions  Medication Sig Dispense Refill  . aspirin EC 81 MG tablet Take 81 mg by mouth daily.    . flecainide (TAMBOCOR) 100 MG tablet TAKE (1) TABLET BY MOUTH TWICE DAILY. 180 tablet 4  . ibuprofen (ADVIL,MOTRIN) 200 MG tablet Take 400 mg by mouth every 8 (eight) hours as needed for mild pain.    . Multiple Vitamin (MULTIVITAMIN) tablet Take 1 tablet by mouth daily.       No current facility-administered medications for this visit.      Past Medical History:  Diagnosis Date  . Borderline hyperlipidemia   . Chest pain   . Exercise-induced tachycardia   . Ganglion    left wrist  . GERD (gastroesophageal reflux disease)   . Thrombocytopenia ([place])    borderline    ROS:   All systems reviewed and negative except as noted in the HPI.   Past Surgical History:  Procedure Laterality Date  . GANGLION CYST EXCISION     Left Wrist  . KNEE ARTHROSCOPY     Left  . TONSILLECTOMY       Family History  Problem Relation Age of Onset  . Aneurysm Father        Aortic  . Coronary artery disease Brother        PCI     Social History   Social History  . Marital status: Married    Spouse name: N/A  . Number of children: N/A  . Years of education: N/A   Occupational History  . 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 Smoking status: Never Smoker  . Smokeless tobacco: Never Used  . Alcohol use 0.0 oz/week     Comment: Occasional wine  . Drug use: No  . Sexual activity: Not on file   Other Topics Concern  . Not on file   Social History Narrative   Married, lives locally, 2 adult children   No regular exercise     BP 118/66   Pulse 67   Ht 5\' 11"  (1.803 m)   Wt 186 lb (84.4 kg)   SpO2 96%   BMI 25.94 kg/m   Physical Exam:  Well appearing 71 year old man, NAD HEENT: Unremarkable Neck:  6 cm JVD, no thyromegally Lymphatics:  No adenopathy Back:  No CVA tenderness Lungs:  Clear, with no wheezes, rales, or rhonchi HEART:  Regular rate rhythm, no murmurs, no rubs, no clicks Abd:  soft, positive bowel sounds, no organomegally, no rebound, no guarding Ext:  2 plus pulses, no edema, no cyanosis, no clubbing Skin:  No rashes no nodules Neuro:  CN II through XII intact, motor grossly intact   Assess/Plan: 1. Paroxysmal atrial fibrillation - he is mostly maintaining sinus rhythm. He will continue flecainide and systemic anticoagulation. 2. Dizziness - his symptoms have much improved. It appears that metoprolol was contributing. 3.  Dyslipidemia - he continues dietary therapy alone. We'll plan on checking fasting lipids when we see him back.  Greg Mann, M.D.

## 2017-05-19 DIAGNOSIS — M542 Cervicalgia: Secondary | ICD-10-CM | POA: Diagnosis not present

## 2017-05-19 DIAGNOSIS — M9901 Segmental and somatic dysfunction of cervical region: Secondary | ICD-10-CM | POA: Diagnosis not present

## 2017-06-16 DIAGNOSIS — M9901 Segmental and somatic dysfunction of cervical region: Secondary | ICD-10-CM | POA: Diagnosis not present

## 2017-06-16 DIAGNOSIS — M542 Cervicalgia: Secondary | ICD-10-CM | POA: Diagnosis not present

## 2017-07-14 DIAGNOSIS — M9901 Segmental and somatic dysfunction of cervical region: Secondary | ICD-10-CM | POA: Diagnosis not present

## 2017-07-14 DIAGNOSIS — M542 Cervicalgia: Secondary | ICD-10-CM | POA: Diagnosis not present

## 2017-08-11 DIAGNOSIS — M542 Cervicalgia: Secondary | ICD-10-CM | POA: Diagnosis not present

## 2017-08-11 DIAGNOSIS — M9901 Segmental and somatic dysfunction of cervical region: Secondary | ICD-10-CM | POA: Diagnosis not present

## 2017-09-15 DIAGNOSIS — M542 Cervicalgia: Secondary | ICD-10-CM | POA: Diagnosis not present

## 2017-09-15 DIAGNOSIS — M9901 Segmental and somatic dysfunction of cervical region: Secondary | ICD-10-CM | POA: Diagnosis not present

## 2017-10-10 DIAGNOSIS — R7301 Impaired fasting glucose: Secondary | ICD-10-CM | POA: Diagnosis not present

## 2017-10-10 DIAGNOSIS — D696 Thrombocytopenia, unspecified: Secondary | ICD-10-CM | POA: Diagnosis not present

## 2017-10-10 DIAGNOSIS — K219 Gastro-esophageal reflux disease without esophagitis: Secondary | ICD-10-CM | POA: Diagnosis not present

## 2017-10-10 DIAGNOSIS — Z125 Encounter for screening for malignant neoplasm of prostate: Secondary | ICD-10-CM | POA: Diagnosis not present

## 2017-10-10 DIAGNOSIS — R944 Abnormal results of kidney function studies: Secondary | ICD-10-CM | POA: Diagnosis not present

## 2017-10-10 DIAGNOSIS — N529 Male erectile dysfunction, unspecified: Secondary | ICD-10-CM | POA: Diagnosis not present

## 2017-10-10 DIAGNOSIS — R000 Tachycardia, unspecified: Secondary | ICD-10-CM | POA: Diagnosis not present

## 2017-10-14 DIAGNOSIS — K5900 Constipation, unspecified: Secondary | ICD-10-CM | POA: Diagnosis not present

## 2017-10-14 DIAGNOSIS — Z0001 Encounter for general adult medical examination with abnormal findings: Secondary | ICD-10-CM | POA: Diagnosis not present

## 2017-10-14 DIAGNOSIS — N529 Male erectile dysfunction, unspecified: Secondary | ICD-10-CM | POA: Diagnosis not present

## 2017-10-14 DIAGNOSIS — R944 Abnormal results of kidney function studies: Secondary | ICD-10-CM | POA: Diagnosis not present

## 2017-10-14 DIAGNOSIS — K219 Gastro-esophageal reflux disease without esophagitis: Secondary | ICD-10-CM | POA: Diagnosis not present

## 2017-10-14 DIAGNOSIS — R7301 Impaired fasting glucose: Secondary | ICD-10-CM | POA: Diagnosis not present

## 2017-10-14 DIAGNOSIS — D696 Thrombocytopenia, unspecified: Secondary | ICD-10-CM | POA: Diagnosis not present

## 2017-10-14 DIAGNOSIS — R000 Tachycardia, unspecified: Secondary | ICD-10-CM | POA: Diagnosis not present

## 2017-10-20 DIAGNOSIS — M542 Cervicalgia: Secondary | ICD-10-CM | POA: Diagnosis not present

## 2017-10-20 DIAGNOSIS — M9901 Segmental and somatic dysfunction of cervical region: Secondary | ICD-10-CM | POA: Diagnosis not present

## 2017-10-30 ENCOUNTER — Other Ambulatory Visit: Payer: Self-pay | Admitting: Internal Medicine

## 2017-11-10 ENCOUNTER — Encounter: Payer: Self-pay | Admitting: Internal Medicine

## 2017-11-17 DIAGNOSIS — M9901 Segmental and somatic dysfunction of cervical region: Secondary | ICD-10-CM | POA: Diagnosis not present

## 2017-11-17 DIAGNOSIS — M542 Cervicalgia: Secondary | ICD-10-CM | POA: Diagnosis not present

## 2017-12-15 DIAGNOSIS — M9901 Segmental and somatic dysfunction of cervical region: Secondary | ICD-10-CM | POA: Diagnosis not present

## 2017-12-15 DIAGNOSIS — M542 Cervicalgia: Secondary | ICD-10-CM | POA: Diagnosis not present

## 2018-01-12 DIAGNOSIS — M9901 Segmental and somatic dysfunction of cervical region: Secondary | ICD-10-CM | POA: Diagnosis not present

## 2018-01-12 DIAGNOSIS — M542 Cervicalgia: Secondary | ICD-10-CM | POA: Diagnosis not present

## 2018-02-09 DIAGNOSIS — M542 Cervicalgia: Secondary | ICD-10-CM | POA: Diagnosis not present

## 2018-02-09 DIAGNOSIS — M9901 Segmental and somatic dysfunction of cervical region: Secondary | ICD-10-CM | POA: Diagnosis not present

## 2018-02-26 ENCOUNTER — Ambulatory Visit (INDEPENDENT_AMBULATORY_CARE_PROVIDER_SITE_OTHER): Payer: Self-pay

## 2018-02-26 DIAGNOSIS — Z1211 Encounter for screening for malignant neoplasm of colon: Secondary | ICD-10-CM

## 2018-02-26 MED ORDER — NA SULFATE-K SULFATE-MG SULF 17.5-3.13-1.6 GM/177ML PO SOLN: 1.0000 | ORAL | 0 refills | Status: DC

## 2018-02-26 NOTE — Patient Instructions (Signed)
Greg Mann  [birth year] MRN: [id number]     Procedure Date: 04/22/18 Time to register: 7:30am Place to register: [facility] Procedure Time: 8:30am Scheduled provider: R. Garfield Cornea, MD    PREPARATION FOR COLONOSCOPY WITH SUPREP BOWEL PREP KIT  Note: Suprep Bowel Prep Kit is a split-dose (2day) regimen. Consumption of BOTH 6-ounce bottles is required for a complete prep.  Please notify us immediately if you are diabetic, take iron supplements, or if you are on Coumadin or any other blood thinners.                                                                                                                                                    2 DAYS BEFORE PROCEDURE:  DATE: 04/20/18   DAY: Monday Begin clear liquid diet AFTER your lunch meal. NO SOLID FOODS after this point.  1 DAY BEFORE PROCEDURE:  DATE: 04/21/18   DAY: Tuesday Continue clear liquids the entire day - NO SOLID FOOD.     At 6:00pm: Complete steps 1 through 4 below, using ONE (1) 6-ounce bottle, before going to bed. Step 1:  Pour ONE (1) 6-ounce bottle of SUPREP liquid into the mixing container.  Step 2:  Add cool drinking water to the 16 ounce line on the container and mix.  Note: Dilute the solution concentrate as directed prior to use. Step 3:  DRINK ALL the liquid in the container. Step 4:  You MUST drink an additional two (2) or more 16 ounce containers of water over the next one (1) hour.   Continue clear liquids only EXCEPTION: If you take medications for your heart, blood pressure, or breathing, you may take these medications with a small amount of clear liquid.   DAY OF PROCEDURE:   DATE: 04/22/18  DAY: Wednesday    5 hours before your procedure at 3:30am: Step 1:  Pour ONE (1) 6-ounce bottle of SUPREP liquid into the mixing container.  Step 2:  Add cool drinking water to the 16 ounce line on the container and mix.  Note: Dilute the solution concentrate as directed prior to use. Step  3:  DRINK ALL the liquid in the container. Step 4:  You MUST drink an additional two (2) or more 16 ounce containers of water over the next one (1) hour. You MUST complete the final glass of water at least 3 hours before your colonoscopy.   Nothing by mouth past 5:30am  You may take your morning medications with sip of water unless we have instructed otherwise.    Please see below for Dietary Information.  CLEAR LIQUIDS INCLUDE:  Water Jello (NOT red in color)   Ice Popsicles (NOT red in color)   Tea (sugar ok, no milk/cream) Powdered fruit flavored drinks  Coffee (sugar ok, no milk/cream) Gatorade/ Lemonade/ Kool-Aid  (NOT  red in color)   Juice: apple, white grape, white cranberry Soft drinks  Clear bullion, consomme, broth (fat free beef/chicken/vegetable)  Carbonated beverages (any kind)  Strained chicken noodle soup Hard Candy   Remember: Clear liquids are liquids that will allow you to see your fingers on the other side of a clear glass. Be sure liquids are NOT red in color, and not cloudy, but CLEAR.  DO NOT EAT OR DRINK ANY OF THE FOLLOWING:  Dairy products of any kind   Cranberry juice Tomato juice / V8 juice   Grapefruit juice Orange juice     Red grape juice  Do not eat any solid foods, including such foods as: cereal, oatmeal, yogurt, fruits, vegetables, creamed soups, eggs, bread, crackers, pureed foods in a blender, etc.   HELPFUL HINTS FOR DRINKING PREP SOLUTION:   Make sure prep is extremely cold. Mix and refrigerate the the morning of the prep. You may also put in the freezer.   You may try mixing some Crystal Light or Country Time Lemonade if you prefer. Mix in small amounts; add more if necessary.  Try drinking through a straw  Rinse mouth with water or a mouthwash between glasses, to remove after-taste.  Try sipping on a cold beverage /ice/ popsicles between glasses of prep.  Place a piece of sugar-free hard candy in mouth between glasses.  If you become  nauseated, try consuming smaller amounts, or stretch out the time between glasses. Stop for 30-60 minutes, then slowly start back drinking.     OTHER INSTRUCTIONS  You will need a responsible adult at least 73 years of age to accompany you and drive you home. This person must remain in the waiting room during your procedure. The hospital will cancel your procedure if you do not have a responsible adult with you.   1. Wear loose fitting clothing that is easily removed. 2. Leave jewelry and other valuables at home.  3. Remove all body piercing jewelry and leave at home. 4. Total time from sign-in until discharge is approximately 2-3 hours. 5. You should go home directly after your procedure and rest. You can resume normal activities the day after your procedure. 6. The day of your procedure you should not:  Drive  Make legal decisions  Operate machinery  Drink alcohol  Return to work   You may call the office (Dept: [phone]) before 5:00pm, or page the doctor on call [phone]) after 5:00pm, for further instructions, if necessary.   Insurance Information YOU WILL NEED TO CHECK WITH YOUR INSURANCE COMPANY FOR THE BENEFITS OF COVERAGE YOU HAVE FOR THIS PROCEDURE.  UNFORTUNATELY, NOT ALL INSURANCE COMPANIES HAVE BENEFITS TO COVER ALL OR PART OF THESE TYPES OF PROCEDURES.  IT IS YOUR RESPONSIBILITY TO CHECK YOUR BENEFITS, HOWEVER, WE WILL BE GLAD TO ASSIST YOU WITH ANY CODES YOUR INSURANCE COMPANY MAY NEED.    PLEASE NOTE THAT MOST INSURANCE COMPANIES WILL NOT COVER A SCREENING COLONOSCOPY FOR PEOPLE UNDER THE AGE OF 50  IF YOU HAVE BCBS INSURANCE, YOU MAY HAVE BENEFITS FOR A SCREENING COLONOSCOPY BUT IF POLYPS ARE FOUND THE DIAGNOSIS WILL CHANGE AND THEN YOU MAY HAVE A DEDUCTIBLE THAT WILL NEED TO BE MET. SO PLEASE MAKE SURE YOU CHECK YOUR BENEFITS FOR A SCREENING COLONOSCOPY AS WELL AS A DIAGNOSTIC COLONOSCOPY.

## 2018-02-26 NOTE — Progress Notes (Signed)
Gastroenterology Pre-Procedure Review  Request Date:02/26/18 Requesting Physician: 10 year recall, last tcs 12/07/07 [facility]- no polyps  PATIENT REVIEW QUESTIONS: The patient responded to the following health history questions as indicated:    1. Diabetes Melitis: no 2. Joint replacements in the past 12 months: no 3. Major health problems in the past 3 months: no 4. Has an artificial valve or MVP: no 5. Has a defibrillator: no 6. Has been advised in past to take antibiotics in advance of a procedure like teeth cleaning: no 7. Family history of colon cancer: no  8. Alcohol Use: no 9. History of sleep apnea: no  10. History of coronary artery or other vascular stents placed within the last 12 months: no 11. History of any prior anesthesia complications: no    MEDICATIONS & ALLERGIES:    Patient reports the following regarding taking any blood thinners:   Plavix? no Aspirin? yes (81mg ) Coumadin? no Brilinta? no Xarelto? no Eliquis? no Pradaxa? no Savaysa? no Effient? no  Patient confirms/reports the following medications:  Current Outpatient Medications  Medication Sig Dispense Refill  . aspirin EC 81 MG tablet Take 81 mg by mouth daily.    . flecainide (TAMBOCOR) 100 MG tablet TAKE (1) TABLET BY MOUTH TWICE DAILY. 180 tablet 4  . ibuprofen (ADVIL,MOTRIN) 200 MG tablet Take 400 mg by mouth every 8 (eight) hours as needed for mild pain.    . Multiple Vitamin (MULTIVITAMIN) tablet Take 1 tablet by mouth daily.       No current facility-administered medications for this visit.     Patient confirms/reports the following allergies:  No Known Allergies  No orders of the defined types were placed in this encounter.   AUTHORIZATION INFORMATION Primary Insurance: medicare,  ID #: [id number] a Pre-Cert / Josem Kaufmann required: no   SCHEDULE INFORMATION: Procedure has been scheduled as follows:  Date: 04/22/18, Time: 8:30 Location: [facility]  This Gastroenterology Pre-Precedure Review Form  is being routed to the following provider(s): [facility]

## 2018-03-06 NOTE — Progress Notes (Signed)
Ok to schedule.

## 2018-03-16 DIAGNOSIS — M9901 Segmental and somatic dysfunction of cervical region: Secondary | ICD-10-CM | POA: Diagnosis not present

## 2018-03-16 DIAGNOSIS — M542 Cervicalgia: Secondary | ICD-10-CM | POA: Diagnosis not present

## 2018-04-13 DIAGNOSIS — M542 Cervicalgia: Secondary | ICD-10-CM | POA: Diagnosis not present

## 2018-04-13 DIAGNOSIS — M9901 Segmental and somatic dysfunction of cervical region: Secondary | ICD-10-CM | POA: Diagnosis not present

## 2018-04-22 ENCOUNTER — Encounter (HOSPITAL_COMMUNITY)
Admission: RE | Disposition: A | Discharge status: Home / Self Care | Payer: Self-pay | Source: Ambulatory Visit | Attending: Internal Medicine

## 2018-04-22 ENCOUNTER — Encounter (HOSPITAL_COMMUNITY): Payer: Self-pay | Admitting: *Deleted

## 2018-04-22 ENCOUNTER — Ambulatory Visit (HOSPITAL_COMMUNITY)
Admission: RE | Admit: 2018-04-22 | Discharge: 2018-04-22 | Disposition: A | Discharge status: Home / Self Care | Payer: Medicare Other | Source: Ambulatory Visit | Attending: Internal Medicine | Admitting: Internal Medicine

## 2018-04-22 ENCOUNTER — Other Ambulatory Visit: Payer: Self-pay

## 2018-04-22 DIAGNOSIS — Z7982 Long term (current) use of aspirin: Secondary | ICD-10-CM | POA: Insufficient documentation

## 2018-04-22 DIAGNOSIS — Z1211 Encounter for screening for malignant neoplasm of colon: Secondary | ICD-10-CM | POA: Insufficient documentation

## 2018-04-22 DIAGNOSIS — K6389 Other specified diseases of intestine: Secondary | ICD-10-CM | POA: Insufficient documentation

## 2018-04-22 DIAGNOSIS — Z79899 Other long term (current) drug therapy: Secondary | ICD-10-CM | POA: Insufficient documentation

## 2018-04-22 DIAGNOSIS — K6289 Other specified diseases of anus and rectum: Secondary | ICD-10-CM | POA: Diagnosis not present

## 2018-04-22 DIAGNOSIS — K635 Polyp of colon: Secondary | ICD-10-CM | POA: Insufficient documentation

## 2018-04-22 DIAGNOSIS — Z1212 Encounter for screening for malignant neoplasm of rectum: Secondary | ICD-10-CM

## 2018-04-22 HISTORY — DX: Cardiac arrhythmia, unspecified: I49.9

## 2018-04-22 HISTORY — PX: COLONOSCOPY: SHX5424

## 2018-04-22 SURGERY — COLONOSCOPY
Anesthesia: Moderate Sedation

## 2018-04-22 MED ORDER — MIDAZOLAM HCL 5 MG/5ML IJ SOLN
INTRAMUSCULAR | Status: AC
  Filled 2018-04-22: qty 5

## 2018-04-22 MED ORDER — MEPERIDINE HCL 50 MG/ML IJ SOLN
INTRAMUSCULAR | Status: AC
  Filled 2018-04-22: qty 1

## 2018-04-22 MED ORDER — MEPERIDINE HCL 100 MG/ML IJ SOLN
INTRAMUSCULAR | Status: DC | PRN
  Administered 2018-04-22: 25 mg

## 2018-04-22 MED ORDER — STERILE WATER FOR IRRIGATION IR SOLN
Status: DC | PRN
  Administered 2018-04-22: 09:00:00

## 2018-04-22 MED ORDER — ONDANSETRON HCL 4 MG/2ML IJ SOLN
INTRAMUSCULAR | Status: AC
  Filled 2018-04-22: qty 2

## 2018-04-22 MED ORDER — MIDAZOLAM HCL 5 MG/5ML IJ SOLN
INTRAMUSCULAR | Status: DC | PRN
  Administered 2018-04-22 (×3): 1 mg via INTRAVENOUS

## 2018-04-22 MED ORDER — SODIUM CHLORIDE 0.9 % IV SOLN
INTRAVENOUS | Status: DC
  Administered 2018-04-22: 1000 mL via INTRAVENOUS

## 2018-04-22 MED ORDER — ONDANSETRON HCL 4 MG/2ML IJ SOLN
INTRAMUSCULAR | Status: DC | PRN
  Administered 2018-04-22: 4 mg via INTRAVENOUS

## 2018-04-22 NOTE — Op Note (Signed)
[facility] Patient Name: Greg Mann Procedure Date: 04/22/2018 8:24 AM MRN: [id number] Date of Birth: [birth year] Attending MD: Norvel Richards , MD CSN: [id number] Age: 72 Admit Type: Outpatient Procedure:                Colonoscopy Indications:              Screening for colorectal malignant neoplasm Providers:                Norvel Richards, MD, Janeece Riggers, RN, Nelma Rothman, Technician Referring MD:              Medicines:                Midazolam 3 mg IV, Meperidine 25 mg IV Complications:            No immediate complications. Estimated Blood Loss:     Estimated blood loss was minimal. Procedure:                Pre-Anesthesia Assessment:                           - Prior to the procedure, a History and Physical                            was performed, and patient medications and                            allergies were reviewed. The patient's tolerance of                            previous anesthesia was also reviewed. The risks                            and benefits of the procedure and the sedation                            options and risks were discussed with the patient.                            All questions were answered, and informed consent                            was obtained. Prior Anticoagulants: The patient has                            taken no previous anticoagulant or antiplatelet                            agents. ASA Grade Assessment: II - A patient with                            mild systemic disease. After reviewing the risks  and benefits, the patient was deemed in                            satisfactory condition to undergo the procedure.                           After obtaining informed consent, the colonoscope                            was passed under direct vision. Throughout the                            procedure, the patient's blood pressure, pulse, and               oxygen saturations were monitored continuously. The                            CF-HQ190L ([id number]) scope was introduced through                            the anus and advanced to the the cecum, identified                            by appendiceal orifice and ileocecal valve. The                            colonoscopy was performed without difficulty. The                            patient tolerated the procedure well. The quality                            of the bowel preparation was adequate. Scope In: 8:41:40 AM Scope Out: 8:59:32 AM Scope Withdrawal Time: 0 hours 11 minutes 10 seconds  Total Procedure Duration: 0 hours 17 minutes 52 seconds  Findings:      A 3 mm polyp was found in the cecum. The polyp was sessile. The polyp       was removed with a cold snare. Resection and retrieval were complete.       Estimated blood loss was minimal.      A diffuse area of moderate melanosis was found in the entire colon.      The exam was otherwise without abnormality on direct and retroflexion       views. Impression:               - One 3 mm polyp in the cecum, removed with a cold                            snare. Resected and retrieved.                           - Melanosis in the colon.                           - The examination was otherwise normal  on direct                            and retroflexion views. Moderate Sedation:      Moderate (conscious) sedation was administered by the endoscopy nurse       and supervised by the endoscopist. The following parameters were       monitored: oxygen saturation, heart rate, blood pressure, respiratory       rate, EKG, adequacy of pulmonary ventilation, and response to care.       Total physician intraservice time was 24 minutes. Recommendation:           - Patient has a contact number available for                            emergencies. The signs and symptoms of potential                            delayed complications were  discussed with the                            patient. Return to normal activities tomorrow.                            Written discharge instructions were provided to the                            patient.                           - Resume previous diet.                           - Continue present medications.                           - Await pathology results.                           - Repeat colonoscopy date to be determined after                            pending pathology results are reviewed for                            surveillance based on pathology results.                           - Return to GI office (date not yet determined). Procedure Code(s):        --- Professional ---                           [phone], Colonoscopy, flexible; with removal of                            tumor(s), polyp(s), or other lesion(s) by snare  technique                           G0500, Moderate sedation services provided by the                            same physician or other qualified health care                            professional performing a gastrointestinal                            endoscopic service that sedation supports,                            requiring the presence of an independent trained                            observer to assist in the monitoring of the                            patient's level of consciousness and physiological                            status; initial 15 minutes of intra-service time;                            patient age 71 years or older (additional time may                            be reported with [phone], as appropriate)                           [phone], Moderate sedation services provided by the                            same physician or other qualified health care                            professional performing the diagnostic or                            therapeutic service that the sedation supports,                             requiring the presence of an independent trained                            observer to assist in the monitoring of the                            patient's level of consciousness and physiological                            status; each  additional 15 minutes intraservice                            time (List separately in addition to code for                            primary service) Diagnosis Code(s):        --- Professional ---                           Z12.11, Encounter for screening for malignant                            neoplasm of colon                           D12.0, Benign neoplasm of cecum                           K63.89, Other specified diseases of intestine CPT copyright 2017 American Medical Association. All rights reserved. The codes documented in this report are preliminary and upon coder review may  be revised to meet current compliance requirements. Cristopher Estimable. Letroy Vazguez, MD Norvel Richards, MD 04/22/2018 9:09:01 AM This report has been signed electronically. Number of Addenda: 0

## 2018-04-22 NOTE — H&P (Signed)
@LOGO @   Primary Care Physician:  Celene Squibb, MD Primary Gastroenterologist:  Dr. Gala Romney  Pre-Procedure History & Physical: HPI:  Greg Mann is a 72 y.o. male is here for a screening colonoscopy. No bowel symptoms. No family history of colon cancer.  Negative colonoscopy 10 years.  Past Medical History:  Diagnosis Date  . Borderline hyperlipidemia   . Chest pain   . Dysrhythmia   . Exercise-induced tachycardia   . Ganglion    left wrist  . GERD (gastroesophageal reflux disease)   . Thrombocytopenia ([place])    borderline    Past Surgical History:  Procedure Laterality Date  . GANGLION CYST EXCISION     Left Wrist  . KNEE ARTHROSCOPY     Left  . TONSILLECTOMY      Prior to Admission medications   Medication Sig Start Date End Date Taking? Authorizing Provider  aspirin EC 81 MG tablet Take 81 mg by mouth daily.   Yes [provider]  flecainide (TAMBOCOR) 100 MG tablet TAKE (1) TABLET BY MOUTH TWICE DAILY. 08/19/16  Yes Evans Lance, MD  Multiple Vitamin (MULTIVITAMIN) tablet Take 1 tablet by mouth daily.     Yes [provider]  Na Sulfate-K Sulfate-Mg Sulf (SUPREP BOWEL PREP KIT) 17.5-3.13-1.6 GM/177ML SOLN Take 1 kit by mouth as directed. 02/26/18  Yes Annitta Needs, NP    Allergies as of 02/26/2018  . (No Known Allergies)    Family History  Problem Relation Age of Onset  . Aneurysm Father        Aortic  . Coronary artery disease Brother        PCI    Social History   Socioeconomic History  . Marital status: Married    Spouse name: Not on file  . Number of children: Not on file  . Years of education: Not on file  . Highest education level: Not on file  Occupational History  . Occupation: Press photographer for [employer]  Social Needs  . Financial resource strain: Not on file  . Food insecurity:    Worry: Not on file    Inability: Not on file  . Transportation needs:    Medical: Not on file     Non-medical: Not on file  Tobacco Use  . Smoking status: Never Smoker  . Smokeless tobacco: Never Used  Substance and Sexual Activity  . Alcohol use: Yes    Alcohol/week: 0.0 standard drinks    Comment: Occasional wine  . Drug use: No  . Sexual activity: Not on file  Lifestyle  . Physical activity:    Days per week: Not on file    Minutes per session: Not on file  . Stress: Not on file  Relationships  . Social connections:    Talks on phone: Not on file    Gets together: Not on file    Attends religious service: Not on file    Active member of club or organization: Not on file    Attends meetings of clubs or organizations: Not on file    Relationship status: Not on file  . Intimate partner violence:    Fear of current or ex partner: Not on file    Emotionally abused: Not on file    Physically abused: Not on file    Forced sexual activity: Not on file  Other Topics Concern  . Not on file  Social History Narrative   Married, lives locally, 2 adult children  No regular exercise    Review of Systems: See HPI, otherwise negative ROS  Physical Exam: BP (!) 150/84   Pulse 74   Temp 97.7 F (36.5 C) (Oral)   Resp 18   Ht 5' 11"  (1.803 m)   Wt 79.4 kg   SpO2 98%   BMI 24.41 kg/m  General:   Alert,  Well-developed, well-nourished, pleasant and cooperative in NAD Lungs:  Clear throughout to auscultation.   No wheezes, crackles, or rhonchi. No acute distress. Heart:  Regular rate and rhythm; no murmurs, clicks, rubs,  or gallops. Abdomen:  Soft, nontender and nondistended. No masses, hepatosplenomegaly or hernias noted. Normal bowel sounds, without guarding, and without rebound.   Impression/Plan: Greg Mann is now here to undergo a screening colonoscopy. Average risk screening examination.  Risks, benefits, limitations, imponderables and alternatives regarding colonoscopy have been reviewed with the patient. Questions have been answered. All parties agreeable.      Notice:  This dictation was prepared with Dragon dictation along with smaller phrase technology. Any transcriptional errors that result from this process are unintentional and may not be corrected upon review.

## 2018-04-22 NOTE — Discharge Instructions (Signed)
Colonoscopy Discharge Instructions  Read the instructions outlined below and refer to this sheet in the next few weeks. These discharge instructions provide you with general information on caring for yourself after you leave the hospital. Your doctor may also give you specific instructions. While your treatment has been planned according to the most current medical practices available, unavoidable complications occasionally occur. If you have any problems or questions after discharge, call Dr. Gala Romney at [phone]. ACTIVITY  You may resume your regular activity, but move at a slower pace for the next 24 hours.   Take frequent rest periods for the next 24 hours.   Walking will help get rid of the air and reduce the bloated feeling in your belly (abdomen).   No driving for 24 hours (because of the medicine (anesthesia) used during the test).    Do not sign any important legal documents or operate any machinery for 24 hours (because of the anesthesia used during the test).  NUTRITION  Drink plenty of fluids.   You may resume your normal diet as instructed by your doctor.   Begin with a light meal and progress to your normal diet. Heavy or fried foods are harder to digest and may make you feel sick to your stomach (nauseated).   Avoid alcoholic beverages for 24 hours or as instructed.  MEDICATIONS  You may resume your normal medications unless your doctor tells you otherwise.  WHAT YOU CAN EXPECT TODAY  Some feelings of bloating in the abdomen.   Passage of more gas than usual.   Spotting of blood in your stool or on the toilet paper.  IF YOU HAD POLYPS REMOVED DURING THE COLONOSCOPY:  No aspirin products for 7 days or as instructed.   No alcohol for 7 days or as instructed.   Eat a soft diet for the next 24 hours.  FINDING OUT THE RESULTS OF YOUR TEST Not all test results are available during your visit. If your test results are not back during the visit, make an appointment  with your caregiver to find out the results. Do not assume everything is normal if you have not heard from your caregiver or the medical facility. It is important for you to follow up on all of your test results.  SEEK IMMEDIATE MEDICAL ATTENTION IF:  You have more than a spotting of blood in your stool.   Your belly is swollen (abdominal distention).   You are nauseated or vomiting.   You have a temperature over 101.   You have abdominal pain or discomfort that is severe or gets worse throughout the day.    Colon polyp information provided   Further recommendations to follow pending review of pathology report   Colon Polyps Polyps are tissue growths inside the body. Polyps can grow in many places, including the large intestine (colon). A polyp may be a round bump or a mushroom-shaped growth. You could have one polyp or several. Most colon polyps are noncancerous (benign). However, some colon polyps can become cancerous over time. What are the causes? The exact cause of colon polyps is not known. What increases the risk? This condition is more likely to develop in people who:  Have a family history of colon cancer or colon polyps.  Are older than 4 or older than 45 if they are African American.  Have inflammatory bowel disease, such as ulcerative colitis or Crohn disease.  Are overweight.  Smoke cigarettes.  Do not get enough exercise.  Drink too much  alcohol.  Eat a diet that is: ? High in fat and red meat. ? Low in fiber.  Had childhood cancer that was treated with abdominal radiation.  What are the signs or symptoms? Most polyps do not cause symptoms. If you have symptoms, they may include:  Blood coming from your rectum when having a bowel movement.  Blood in your stool.The stool may look dark red or black.  A change in bowel habits, such as constipation or diarrhea.  How is this diagnosed? This condition is diagnosed with a colonoscopy. This is a  procedure that uses a lighted, flexible scope to look at the inside of your colon. How is this treated? Treatment for this condition involves removing any polyps that are found. Those polyps will then be tested for cancer. If cancer is found, your health care provider will talk to you about options for colon cancer treatment. Follow these instructions at home: Diet  Eat plenty of fiber, such as fruits, vegetables, and whole grains.  Eat foods that are high in calcium and vitamin D, such as milk, cheese, yogurt, eggs, liver, fish, and broccoli.  Limit foods high in fat, red meats, and processed meats, such as hot dogs, sausage, bacon, and lunch meats.  Maintain a healthy weight, or lose weight if recommended by your health care provider. General instructions  Do not smoke cigarettes.  Do not drink alcohol excessively.  Keep all follow-up visits as told by your health care provider. This is important. This includes keeping regularly scheduled colonoscopies. Talk to your health care provider about when you need a colonoscopy.  Exercise every day or as told by your health care provider. Contact a health care provider if:  You have new or worsening bleeding during a bowel movement.  You have new or increased blood in your stool.  You have a change in bowel habits.  You unexpectedly lose weight. This information is not intended to replace advice given to you by your health care provider. Make sure you discuss any questions you have with your health care provider.

## 2018-04-26 ENCOUNTER — Encounter: Payer: Self-pay | Admitting: Internal Medicine

## 2018-04-27 ENCOUNTER — Encounter (HOSPITAL_COMMUNITY): Payer: Self-pay | Admitting: Internal Medicine

## 2018-05-15 ENCOUNTER — Other Ambulatory Visit: Payer: Self-pay | Admitting: Internal Medicine

## 2018-05-21 ENCOUNTER — Ambulatory Visit (INDEPENDENT_AMBULATORY_CARE_PROVIDER_SITE_OTHER): Payer: Medicare Other | Admitting: Internal Medicine

## 2018-05-21 ENCOUNTER — Encounter: Payer: Self-pay | Admitting: Internal Medicine

## 2018-05-21 VITALS — BP 112/64 | HR 65 | Ht 71.0 in | Wt 182.0 lb

## 2018-05-21 DIAGNOSIS — I4892 Unspecified atrial flutter: Secondary | ICD-10-CM | POA: Diagnosis not present

## 2018-05-21 NOTE — Patient Instructions (Addendum)
Medication Instructions:  Stop flecainide  Labwork: none  Testing/Procedures: none  Follow-Up: Your physician recommends that you schedule a follow-up appointment in: 2 weeks for nurse visit with EKG    Any Other Special Instructions Will Be Listed Below (If Applicable).     If you need a refill on your cardiac medications before your next appointment, please call your pharmacy.

## 2018-05-21 NOTE — Progress Notes (Signed)
HPI [name] is returns today for ongoing evaluation of paroxysmal atrial fibrillation. He is a pleasant 72 year old man who still works. The patient has had paroxysms of atrial fibrillation for several years which have been well controlled with flecainide therapy. When I last saw the patient, he complained of some fatigue and dizziness, particularly with exertion. We stopped his beta blocker. His symptoms initially improved but over the past few months have worsened. He walks up stairs and feels tired. He walks outside at work and gets tired. His palpitations are infrequent and he has had no chest pain, shortness of breath, or syncope. No peripheral edema.  No Known Allergies   Current Outpatient Medications  Medication Sig Dispense Refill  . aspirin EC 81 MG tablet Take 81 mg by mouth daily.    . flecainide (TAMBOCOR) 100 MG tablet TAKE (1) TABLET BY MOUTH TWICE DAILY. 180 tablet 0  . Multiple Vitamin (MULTIVITAMIN) tablet Take 1 tablet by mouth daily.       No current facility-administered medications for this visit.      Past Medical History:  Diagnosis Date  . Borderline hyperlipidemia   . Chest pain   . Dysrhythmia   . Exercise-induced tachycardia   . Ganglion    left wrist  . GERD (gastroesophageal reflux disease)   . Thrombocytopenia ([place])    borderline    ROS:   All systems reviewed and negative except as noted in the HPI.   Past Surgical History:  Procedure Laterality Date  . COLONOSCOPY N/A 04/22/2018   Procedure: COLONOSCOPY;  Surgeon: Daneil Dolin, MD;  [facility];  Service: Endoscopy;  Laterality: N/A;  8:30  . GANGLION CYST EXCISION     Left Wrist  . KNEE ARTHROSCOPY     Left  . TONSILLECTOMY       Family History  Problem Relation Age of Onset  . Aneurysm Father        Aortic  . Coronary artery disease Brother        PCI     Social History   Socioeconomic History  . Marital status: Married    Spouse name: Not on  file  . Number of children: Not on file  . Years of education: Not on file  . Highest education level: Not on file  Occupational History  . Occupation: Press photographer for [employer], [employer]  Social Needs  . Financial resource strain: Not on file  . Food insecurity:    Worry: Not on file    Inability: Not on file  . Transportation needs:    Medical: Not on file    Non-medical: Not on file  Tobacco Use  . Smoking status: Never Smoker  . Smokeless tobacco: Never Used  Substance and Sexual Activity  . Alcohol use: Yes    Alcohol/week: 0.0 standard drinks    Comment: Occasional wine  . Drug use: No  . Sexual activity: Not on file  Lifestyle  . Physical activity:    Days per week: Not on file    Minutes per session: Not on file  . Stress: Not on file  Relationships  . Social connections:    Talks on phone: Not on file    Gets together: Not on file    Attends religious service: Not on file    Active member of club or organization: Not on file    Attends meetings of clubs or organizations: Not on file  Relationship status: Not on file  . Intimate partner violence:    Fear of current or ex partner: Not on file    Emotionally abused: Not on file    Physically abused: Not on file    Forced sexual activity: Not on file  Other Topics Concern  . Not on file  Social History Narrative   Married, lives locally, 2 adult children   No regular exercise     BP 112/64   Pulse 65   Ht 5\' 11"  (1.803 m)   Wt 182 lb (82.6 kg)   BMI 25.38 kg/m   Physical Exam:  Well appearing NAD HEENT: Unremarkable Neck:  No JVD, no thyromegally Lymphatics:  No adenopathy Back:  No CVA tenderness Lungs:  Clear with no wheezes HEART:  Regular rate rhythm, no murmurs, no rubs, no clicks Abd:  soft, positive bowel sounds, no organomegally, no rebound, no guarding Ext:  2 plus pulses, no edema, no cyanosis, no clubbing Skin:  No rashes no nodules Neuro:  CN II through XII  intact, motor grossly intact  EKG - NSR with QRS of 124, IVCD   Assess/Plan: 1. PAF - his symptoms are well controlled. However he may be having side effects on the flecainide. See below. 2. Fatigue and weakness - he has had worsening. Unclear if this is due to flecainide or something else. I have asked him to stop the flecainide and return in 2 weeks for an ECG and to report as to how he is doing. We might have him undergo stress testing or start rhythmol.  I spent 25 minutes including 50% face to face time with the patient today.  Mikle Bosworth.D.

## 2018-05-25 DIAGNOSIS — M9901 Segmental and somatic dysfunction of cervical region: Secondary | ICD-10-CM | POA: Diagnosis not present

## 2018-05-25 DIAGNOSIS — M542 Cervicalgia: Secondary | ICD-10-CM | POA: Diagnosis not present

## 2018-06-04 ENCOUNTER — Ambulatory Visit (INDEPENDENT_AMBULATORY_CARE_PROVIDER_SITE_OTHER): Payer: Medicare Other | Admitting: *Deleted

## 2018-06-04 VITALS — BP 133/83 | HR 56 | Ht 71.0 in | Wt 183.0 lb

## 2018-06-04 DIAGNOSIS — I480 Paroxysmal atrial fibrillation: Secondary | ICD-10-CM | POA: Diagnosis not present

## 2018-06-04 NOTE — Progress Notes (Signed)
Pt reports that he has more energy. No complaints at this time.

## 2018-06-22 DIAGNOSIS — M9901 Segmental and somatic dysfunction of cervical region: Secondary | ICD-10-CM | POA: Diagnosis not present

## 2018-06-22 DIAGNOSIS — M542 Cervicalgia: Secondary | ICD-10-CM | POA: Diagnosis not present

## 2018-07-20 DIAGNOSIS — M9901 Segmental and somatic dysfunction of cervical region: Secondary | ICD-10-CM | POA: Diagnosis not present

## 2018-07-20 DIAGNOSIS — M542 Cervicalgia: Secondary | ICD-10-CM | POA: Diagnosis not present

## 2018-08-17 DIAGNOSIS — M542 Cervicalgia: Secondary | ICD-10-CM | POA: Diagnosis not present

## 2018-08-17 DIAGNOSIS — M9901 Segmental and somatic dysfunction of cervical region: Secondary | ICD-10-CM | POA: Diagnosis not present

## 2018-09-16 DIAGNOSIS — M542 Cervicalgia: Secondary | ICD-10-CM | POA: Diagnosis not present

## 2018-09-16 DIAGNOSIS — M9901 Segmental and somatic dysfunction of cervical region: Secondary | ICD-10-CM | POA: Diagnosis not present

## 2018-10-12 DIAGNOSIS — M542 Cervicalgia: Secondary | ICD-10-CM | POA: Diagnosis not present

## 2018-10-12 DIAGNOSIS — M9901 Segmental and somatic dysfunction of cervical region: Secondary | ICD-10-CM | POA: Diagnosis not present

## 2018-10-13 DIAGNOSIS — N529 Male erectile dysfunction, unspecified: Secondary | ICD-10-CM | POA: Diagnosis not present

## 2018-10-13 DIAGNOSIS — Z125 Encounter for screening for malignant neoplasm of prostate: Secondary | ICD-10-CM | POA: Diagnosis not present

## 2018-10-13 DIAGNOSIS — D696 Thrombocytopenia, unspecified: Secondary | ICD-10-CM | POA: Diagnosis not present

## 2018-10-13 DIAGNOSIS — R7301 Impaired fasting glucose: Secondary | ICD-10-CM | POA: Diagnosis not present

## 2018-10-13 DIAGNOSIS — R944 Abnormal results of kidney function studies: Secondary | ICD-10-CM | POA: Diagnosis not present

## 2018-10-15 DIAGNOSIS — D696 Thrombocytopenia, unspecified: Secondary | ICD-10-CM | POA: Diagnosis not present

## 2018-10-15 DIAGNOSIS — Z23 Encounter for immunization: Secondary | ICD-10-CM | POA: Diagnosis not present

## 2018-10-15 DIAGNOSIS — I480 Paroxysmal atrial fibrillation: Secondary | ICD-10-CM | POA: Diagnosis not present

## 2018-10-15 DIAGNOSIS — K219 Gastro-esophageal reflux disease without esophagitis: Secondary | ICD-10-CM | POA: Diagnosis not present

## 2018-10-15 DIAGNOSIS — K635 Polyp of colon: Secondary | ICD-10-CM | POA: Diagnosis not present

## 2018-10-15 DIAGNOSIS — K5900 Constipation, unspecified: Secondary | ICD-10-CM | POA: Diagnosis not present

## 2018-10-15 DIAGNOSIS — Z0000 Encounter for general adult medical examination without abnormal findings: Secondary | ICD-10-CM | POA: Diagnosis not present

## 2018-10-15 DIAGNOSIS — R7301 Impaired fasting glucose: Secondary | ICD-10-CM | POA: Diagnosis not present

## 2018-11-09 DIAGNOSIS — M9901 Segmental and somatic dysfunction of cervical region: Secondary | ICD-10-CM | POA: Diagnosis not present

## 2018-11-09 DIAGNOSIS — M542 Cervicalgia: Secondary | ICD-10-CM | POA: Diagnosis not present

## 2018-12-07 DIAGNOSIS — M9901 Segmental and somatic dysfunction of cervical region: Secondary | ICD-10-CM | POA: Diagnosis not present

## 2018-12-07 DIAGNOSIS — M542 Cervicalgia: Secondary | ICD-10-CM | POA: Diagnosis not present

## 2019-01-04 DIAGNOSIS — M9901 Segmental and somatic dysfunction of cervical region: Secondary | ICD-10-CM | POA: Diagnosis not present

## 2019-01-04 DIAGNOSIS — M542 Cervicalgia: Secondary | ICD-10-CM | POA: Diagnosis not present

## 2019-01-13 ENCOUNTER — Other Ambulatory Visit: Payer: Self-pay | Admitting: Internal Medicine

## 2019-01-13 NOTE — Telephone Encounter (Signed)
This is [place] pt 

## 2019-01-13 NOTE — Telephone Encounter (Signed)
Call placed to patient as last OV note (05/21/2018) stated patient was to stop the Flecainide.  He came back in 2 weeks for f/u nurse visit with EKG - note only mentioned that he had more energy & no other complaints at that time.    Today, patient states that he has been back on the Flecainide since October 2019.  Had AFib attack, so he put himself back on the medication.  He reports that AFib has been under control since.  He does mention that he still seems to have no energy outside and is light headed from time to time.    Please advise if okay for refills on this medication & when he needs follow up next.

## 2019-01-18 NOTE — Telephone Encounter (Signed)
Patient is calling back wanting to know if he can get a refill on his Flecainide  Please give pt a call.   Also, pt does not have a f/u scheduled w/ Dr. Lovena Le and no future recall placed.

## 2019-01-18 NOTE — Addendum Note (Signed)
Addended by: Julian Hy T on: 01/18/2019 02:18 PM   Modules accepted: Orders

## 2019-01-18 NOTE — Telephone Encounter (Signed)
[facility] to confirm that flecainide was deleted (per LOV flecainide was d/c'd) pharmacy confirmed this was deleted from pt profile

## 2019-01-19 DIAGNOSIS — M5412 Radiculopathy, cervical region: Secondary | ICD-10-CM | POA: Diagnosis not present

## 2019-01-19 DIAGNOSIS — M9901 Segmental and somatic dysfunction of cervical region: Secondary | ICD-10-CM | POA: Diagnosis not present

## 2019-01-20 DIAGNOSIS — M9901 Segmental and somatic dysfunction of cervical region: Secondary | ICD-10-CM | POA: Diagnosis not present

## 2019-01-20 DIAGNOSIS — M5412 Radiculopathy, cervical region: Secondary | ICD-10-CM | POA: Diagnosis not present

## 2019-01-21 DIAGNOSIS — M9901 Segmental and somatic dysfunction of cervical region: Secondary | ICD-10-CM | POA: Diagnosis not present

## 2019-01-21 DIAGNOSIS — M5412 Radiculopathy, cervical region: Secondary | ICD-10-CM | POA: Diagnosis not present

## 2019-01-22 ENCOUNTER — Telehealth: Payer: Self-pay | Admitting: Internal Medicine

## 2019-01-22 ENCOUNTER — Telehealth: Payer: Self-pay

## 2019-01-22 DIAGNOSIS — M5412 Radiculopathy, cervical region: Secondary | ICD-10-CM | POA: Diagnosis not present

## 2019-01-22 DIAGNOSIS — M9901 Segmental and somatic dysfunction of cervical region: Secondary | ICD-10-CM | POA: Diagnosis not present

## 2019-01-22 NOTE — Telephone Encounter (Signed)
Calling to check on RX's by Dr.Taylor/tg

## 2019-01-22 NOTE — Telephone Encounter (Signed)
Pt called to question his need take flecainide. He stated that he was taken off of it last year, byut recently found himself having palpitations more frequent, so he started taking it here and there. He is wondering if he can have a prescription for it, but only as an as needed basis. If not flecainide, another kind of medication he can take for his palpitations. Please advise.

## 2019-01-22 NOTE — Telephone Encounter (Signed)
Returned call. Forwarded to Dr. Lovena Le.

## 2019-01-25 DIAGNOSIS — M5412 Radiculopathy, cervical region: Secondary | ICD-10-CM | POA: Diagnosis not present

## 2019-01-25 DIAGNOSIS — M9901 Segmental and somatic dysfunction of cervical region: Secondary | ICD-10-CM | POA: Diagnosis not present

## 2019-01-25 NOTE — Telephone Encounter (Signed)
He may take the flecainide as needed. GT

## 2019-01-26 DIAGNOSIS — M9901 Segmental and somatic dysfunction of cervical region: Secondary | ICD-10-CM | POA: Diagnosis not present

## 2019-01-26 DIAGNOSIS — M5412 Radiculopathy, cervical region: Secondary | ICD-10-CM | POA: Diagnosis not present

## 2019-01-27 DIAGNOSIS — M9901 Segmental and somatic dysfunction of cervical region: Secondary | ICD-10-CM | POA: Diagnosis not present

## 2019-01-27 DIAGNOSIS — M5412 Radiculopathy, cervical region: Secondary | ICD-10-CM | POA: Diagnosis not present

## 2019-01-28 DIAGNOSIS — M9901 Segmental and somatic dysfunction of cervical region: Secondary | ICD-10-CM | POA: Diagnosis not present

## 2019-01-28 DIAGNOSIS — M5412 Radiculopathy, cervical region: Secondary | ICD-10-CM | POA: Diagnosis not present

## 2019-02-02 DIAGNOSIS — M9901 Segmental and somatic dysfunction of cervical region: Secondary | ICD-10-CM | POA: Diagnosis not present

## 2019-02-02 DIAGNOSIS — M5412 Radiculopathy, cervical region: Secondary | ICD-10-CM | POA: Diagnosis not present

## 2019-02-04 DIAGNOSIS — M9901 Segmental and somatic dysfunction of cervical region: Secondary | ICD-10-CM | POA: Diagnosis not present

## 2019-02-04 DIAGNOSIS — M5412 Radiculopathy, cervical region: Secondary | ICD-10-CM | POA: Diagnosis not present

## 2019-02-08 DIAGNOSIS — M9901 Segmental and somatic dysfunction of cervical region: Secondary | ICD-10-CM | POA: Diagnosis not present

## 2019-02-08 DIAGNOSIS — M5412 Radiculopathy, cervical region: Secondary | ICD-10-CM | POA: Diagnosis not present

## 2019-02-10 DIAGNOSIS — M5412 Radiculopathy, cervical region: Secondary | ICD-10-CM | POA: Diagnosis not present

## 2019-02-10 DIAGNOSIS — M9901 Segmental and somatic dysfunction of cervical region: Secondary | ICD-10-CM | POA: Diagnosis not present

## 2019-02-15 DIAGNOSIS — M5412 Radiculopathy, cervical region: Secondary | ICD-10-CM | POA: Diagnosis not present

## 2019-02-15 DIAGNOSIS — M9901 Segmental and somatic dysfunction of cervical region: Secondary | ICD-10-CM | POA: Diagnosis not present

## 2019-02-17 DIAGNOSIS — M5412 Radiculopathy, cervical region: Secondary | ICD-10-CM | POA: Diagnosis not present

## 2019-02-17 DIAGNOSIS — M9901 Segmental and somatic dysfunction of cervical region: Secondary | ICD-10-CM | POA: Diagnosis not present

## 2019-02-22 DIAGNOSIS — M5412 Radiculopathy, cervical region: Secondary | ICD-10-CM | POA: Diagnosis not present

## 2019-02-22 DIAGNOSIS — M9901 Segmental and somatic dysfunction of cervical region: Secondary | ICD-10-CM | POA: Diagnosis not present

## 2019-02-24 DIAGNOSIS — M9901 Segmental and somatic dysfunction of cervical region: Secondary | ICD-10-CM | POA: Diagnosis not present

## 2019-02-24 DIAGNOSIS — M5412 Radiculopathy, cervical region: Secondary | ICD-10-CM | POA: Diagnosis not present

## 2019-03-18 ENCOUNTER — Telehealth: Payer: Self-pay | Admitting: Internal Medicine

## 2019-03-18 ENCOUNTER — Other Ambulatory Visit: Payer: Self-pay

## 2019-03-18 MED ORDER — FLECAINIDE ACETATE 100 MG PO TABS: 100.0000 mg | ORAL_TABLET | Freq: Every day | ORAL | 3 refills | Status: DC | PRN

## 2019-03-18 NOTE — Telephone Encounter (Signed)
Pt called questioning if he could restart his flecainide. Looking back on previous phone note, Dr. Lovena Le states he can go back on it as needed. Sent in new RX.

## 2019-03-18 NOTE — Telephone Encounter (Signed)
Patient would like to discuss medications / tg

## 2019-03-22 DIAGNOSIS — M5412 Radiculopathy, cervical region: Secondary | ICD-10-CM | POA: Diagnosis not present

## 2019-03-22 DIAGNOSIS — M9901 Segmental and somatic dysfunction of cervical region: Secondary | ICD-10-CM | POA: Diagnosis not present

## 2019-04-09 ENCOUNTER — Other Ambulatory Visit: Payer: Self-pay

## 2019-04-19 DIAGNOSIS — M5412 Radiculopathy, cervical region: Secondary | ICD-10-CM | POA: Diagnosis not present

## 2019-04-19 DIAGNOSIS — M9901 Segmental and somatic dysfunction of cervical region: Secondary | ICD-10-CM | POA: Diagnosis not present

## 2019-05-25 ENCOUNTER — Ambulatory Visit (INDEPENDENT_AMBULATORY_CARE_PROVIDER_SITE_OTHER): Payer: Medicare Other | Admitting: Internal Medicine

## 2019-05-25 ENCOUNTER — Encounter: Payer: Self-pay | Admitting: Internal Medicine

## 2019-05-25 ENCOUNTER — Other Ambulatory Visit: Payer: Self-pay

## 2019-05-25 VITALS — BP 134/82 | HR 76 | Temp 96.6°F | Ht 71.0 in | Wt 181.0 lb

## 2019-05-25 DIAGNOSIS — I480 Paroxysmal atrial fibrillation: Secondary | ICD-10-CM

## 2019-05-25 NOTE — Patient Instructions (Signed)
Medication Instructions: Your physician recommends that you continue on your current medications as directed. Please refer to the Current Medication list given to you today.   Labwork: None today  Procedures/Testing: None today  Follow-Up: 1 year with Dr.Taylor  Any Additional Special Instructions Will Be Listed Below (If Applicable).     If you need a refill on your cardiac medications before your next appointment, please call your pharmacy.     Thank you for choosing [facility] !        

## 2019-05-25 NOTE — Progress Notes (Signed)
HPI [name] is returns today for ongoing evaluation of paroxysmal atrial fibrillation. He is a pleasant 73 year old man who still works. The patient has had paroxysms of atrial fibrillation for several years which have been well controlled with flecainide therapy. When I last saw the patient, he complained of some fatigue and dizziness, particularly with exertion which have been present for over a year. His palpitations are infrequent and he has had no chest pain, shortness of breath, or syncope. No peripheral edema.  No Known Allergies   Current Outpatient Medications  Medication Sig Dispense Refill  . aspirin EC 81 MG tablet Take 81 mg by mouth daily.    . flecainide (TAMBOCOR) 100 MG tablet Take 1 tablet (100 mg total) by mouth daily as needed. 90 tablet 3  . Multiple Vitamin (MULTIVITAMIN) tablet Take 1 tablet by mouth daily.       No current facility-administered medications for this visit.      Past Medical History:  Diagnosis Date  . Borderline hyperlipidemia   . Chest pain   . Dysrhythmia   . Exercise-induced tachycardia   . Ganglion    left wrist  . GERD (gastroesophageal reflux disease)   . Thrombocytopenia ([place])    borderline    ROS:   All systems reviewed and negative except as noted in the HPI.   Past Surgical History:  Procedure Laterality Date  . COLONOSCOPY N/A 04/22/2018   Procedure: COLONOSCOPY;  Surgeon: Daneil Dolin, MD;  [facility];  Service: Endoscopy;  Laterality: N/A;  8:30  . GANGLION CYST EXCISION     Left Wrist  . KNEE ARTHROSCOPY     Left  . TONSILLECTOMY       Family History  Problem Relation Age of Onset  . Aneurysm Father        Aortic  . Coronary artery disease Brother        PCI     Social History   Socioeconomic History  . Marital status: Married    Spouse name: Not on file  . Number of children: Not on file  . Years of education: Not on file  . Highest education level: Not on file   Occupational History  . Occupation: Press photographer for [employer], [employer]  Social Needs  . Financial resource strain: Not on file  . Food insecurity    Worry: Not on file    Inability: Not on file  . Transportation needs    Medical: Not on file    Non-medical: Not on file  Tobacco Use  . Smoking status: Never Smoker  . Smokeless tobacco: Never Used  Substance and Sexual Activity  . Alcohol use: Yes    Alcohol/week: 0.0 standard drinks    Comment: Occasional wine  . Drug use: No  . Sexual activity: Not on file  Lifestyle  . Physical activity    Days per week: Not on file    Minutes per session: Not on file  . Stress: Not on file  Relationships  . Social Herbalist on phone: Not on file    Gets together: Not on file    Attends religious service: Not on file    Active member of club or organization: Not on file    Attends meetings of clubs or organizations: Not on file    Relationship status: Not on file  . Intimate partner violence    Fear of current or ex partner: Not  on file    Emotionally abused: Not on file    Physically abused: Not on file    Forced sexual activity: Not on file  Other Topics Concern  . Not on file  Social History Narrative   Married, lives locally, 2 adult children   No regular exercise     BP 134/82   Pulse 76   Temp (!) 96.6 F (35.9 C) (Temporal)   Ht 5\' 11"  (1.803 m)   Wt 181 lb (82.1 kg)   SpO2 97%   BMI 25.24 kg/m   Physical Exam:  Well appearing 73 yo man, NAD HEENT: Unremarkable Neck:  6 cm JVD, no thyromegally Lymphatics:  No adenopathy Back:  No CVA tenderness Lungs:  Clear with no wheezes HEART:  Regular rate rhythm, no murmurs, no rubs, no clicks Abd:  soft, positive bowel sounds, no organomegally, no rebound, no guarding Ext:  2 plus pulses, no edema, no cyanosis, no clubbing Skin:  No rashes no nodules Neuro:  CN II through XII intact, motor grossly intact  EKG - nsr  Assess/Plan:  1. PAF - he is taking the flecainide 100 mg daily and will take an additional tablet as needed. We discussed uptitration but for now we will hold off. He will need to consider anti-coagulation when he turns 74.  2. Dyspnea - his symptoms are mild and have not progressed. He will undergo watchful waiting. I asked him to call if his symptoms worsened.  Mikle Bosworth.D.

## 2019-05-31 DIAGNOSIS — M5412 Radiculopathy, cervical region: Secondary | ICD-10-CM | POA: Diagnosis not present

## 2019-05-31 DIAGNOSIS — M9901 Segmental and somatic dysfunction of cervical region: Secondary | ICD-10-CM | POA: Diagnosis not present

## 2019-06-28 DIAGNOSIS — M5412 Radiculopathy, cervical region: Secondary | ICD-10-CM | POA: Diagnosis not present

## 2019-06-28 DIAGNOSIS — M9901 Segmental and somatic dysfunction of cervical region: Secondary | ICD-10-CM | POA: Diagnosis not present

## 2019-07-26 DIAGNOSIS — M9901 Segmental and somatic dysfunction of cervical region: Secondary | ICD-10-CM | POA: Diagnosis not present

## 2019-07-26 DIAGNOSIS — M5412 Radiculopathy, cervical region: Secondary | ICD-10-CM | POA: Diagnosis not present

## 2019-09-08 DIAGNOSIS — U071 COVID-19: Secondary | ICD-10-CM | POA: Diagnosis not present

## 2019-09-08 DIAGNOSIS — F509 Eating disorder, unspecified: Secondary | ICD-10-CM | POA: Diagnosis not present

## 2019-09-08 DIAGNOSIS — R5383 Other fatigue: Secondary | ICD-10-CM | POA: Diagnosis not present

## 2019-09-20 DIAGNOSIS — J1281 Pneumonia due to SARS-associated coronavirus: Secondary | ICD-10-CM | POA: Diagnosis not present

## 2019-09-27 DIAGNOSIS — K5900 Constipation, unspecified: Secondary | ICD-10-CM | POA: Diagnosis not present

## 2019-09-27 DIAGNOSIS — Z0001 Encounter for general adult medical examination with abnormal findings: Secondary | ICD-10-CM | POA: Diagnosis not present

## 2019-09-27 DIAGNOSIS — R944 Abnormal results of kidney function studies: Secondary | ICD-10-CM | POA: Diagnosis not present

## 2019-09-27 DIAGNOSIS — R000 Tachycardia, unspecified: Secondary | ICD-10-CM | POA: Diagnosis not present

## 2019-09-27 DIAGNOSIS — N529 Male erectile dysfunction, unspecified: Secondary | ICD-10-CM | POA: Diagnosis not present

## 2019-09-27 DIAGNOSIS — D696 Thrombocytopenia, unspecified: Secondary | ICD-10-CM | POA: Diagnosis not present

## 2019-09-27 DIAGNOSIS — K219 Gastro-esophageal reflux disease without esophagitis: Secondary | ICD-10-CM | POA: Diagnosis not present

## 2019-09-27 DIAGNOSIS — R7301 Impaired fasting glucose: Secondary | ICD-10-CM | POA: Diagnosis not present

## 2019-10-14 ENCOUNTER — Encounter: Payer: Self-pay | Admitting: Orthopedic Surgery

## 2019-10-14 DIAGNOSIS — Z0001 Encounter for general adult medical examination with abnormal findings: Secondary | ICD-10-CM | POA: Diagnosis not present

## 2019-10-14 DIAGNOSIS — R7301 Impaired fasting glucose: Secondary | ICD-10-CM | POA: Diagnosis not present

## 2019-10-14 DIAGNOSIS — R944 Abnormal results of kidney function studies: Secondary | ICD-10-CM | POA: Diagnosis not present

## 2019-10-21 DIAGNOSIS — K635 Polyp of colon: Secondary | ICD-10-CM | POA: Diagnosis not present

## 2019-10-21 DIAGNOSIS — Z0001 Encounter for general adult medical examination with abnormal findings: Secondary | ICD-10-CM | POA: Diagnosis not present

## 2019-10-21 DIAGNOSIS — K219 Gastro-esophageal reflux disease without esophagitis: Secondary | ICD-10-CM | POA: Diagnosis not present

## 2019-10-21 DIAGNOSIS — I480 Paroxysmal atrial fibrillation: Secondary | ICD-10-CM | POA: Diagnosis not present

## 2019-10-21 DIAGNOSIS — M71332 Other bursal cyst, left wrist: Secondary | ICD-10-CM | POA: Diagnosis not present

## 2019-10-21 DIAGNOSIS — R7301 Impaired fasting glucose: Secondary | ICD-10-CM | POA: Diagnosis not present

## 2019-10-21 DIAGNOSIS — K5900 Constipation, unspecified: Secondary | ICD-10-CM | POA: Diagnosis not present

## 2019-10-21 DIAGNOSIS — D696 Thrombocytopenia, unspecified: Secondary | ICD-10-CM | POA: Diagnosis not present

## 2019-11-09 ENCOUNTER — Ambulatory Visit: Payer: Medicare Other | Attending: Internal Medicine

## 2019-11-09 DIAGNOSIS — Z23 Encounter for immunization: Secondary | ICD-10-CM | POA: Insufficient documentation

## 2019-11-29 ENCOUNTER — Other Ambulatory Visit: Payer: Self-pay | Admitting: Internal Medicine

## 2019-12-07 ENCOUNTER — Ambulatory Visit: Payer: Medicare Other | Attending: Internal Medicine

## 2019-12-07 DIAGNOSIS — Z23 Encounter for immunization: Secondary | ICD-10-CM

## 2019-12-07 NOTE — Progress Notes (Signed)
   Covid-19 Vaccination Clinic  Name:  Greg Mann    MRN: [id number] DOB: Jan 27, 1946  12/07/2019  Greg Mann was observed post Covid-19 immunization for 15 minutes without incident. He was provided with Vaccine Information Sheet and instruction to access the V-Safe system.   Greg Mann was instructed to call 911 with any severe reactions post vaccine: Marland Kitchen Difficulty breathing  . Swelling of face and throat  . A fast heartbeat  . A bad rash all over body  . Dizziness and weakness   Immunizations Administered    Name Date Dose VIS Date Route   Moderna COVID-19 Vaccine 12/07/2019  9:55 AM 0.5 mL 08/10/2019 Intramuscular   Manufacturer: Moderna   Lot: [id number]   [id number]

## 2019-12-22 ENCOUNTER — Other Ambulatory Visit: Payer: Self-pay

## 2019-12-22 ENCOUNTER — Encounter (HOSPITAL_COMMUNITY): Payer: Self-pay

## 2019-12-22 ENCOUNTER — Emergency Department (HOSPITAL_COMMUNITY): Payer: Medicare Other

## 2019-12-22 ENCOUNTER — Emergency Department (HOSPITAL_COMMUNITY)
Admission: EM | Admit: 2019-12-22 | Discharge: 2019-12-22 | Disposition: A | Discharge status: Home / Self Care | Payer: Medicare Other | Attending: Emergency Medicine | Admitting: Emergency Medicine

## 2019-12-22 DIAGNOSIS — M79652 Pain in left thigh: Secondary | ICD-10-CM | POA: Diagnosis not present

## 2019-12-22 DIAGNOSIS — I4891 Unspecified atrial fibrillation: Secondary | ICD-10-CM | POA: Diagnosis not present

## 2019-12-22 DIAGNOSIS — Z79899 Other long term (current) drug therapy: Secondary | ICD-10-CM | POA: Insufficient documentation

## 2019-12-22 DIAGNOSIS — Z7982 Long term (current) use of aspirin: Secondary | ICD-10-CM | POA: Diagnosis not present

## 2019-12-22 DIAGNOSIS — R600 Localized edema: Secondary | ICD-10-CM | POA: Diagnosis not present

## 2019-12-22 DIAGNOSIS — M545 Low back pain: Secondary | ICD-10-CM | POA: Diagnosis not present

## 2019-12-22 MED ORDER — TRAMADOL HCL 50 MG PO TABS: 50.0000 mg | ORAL_TABLET | Freq: Four times a day (QID) | ORAL | 0 refills | Status: DC | PRN

## 2019-12-22 NOTE — ED Triage Notes (Signed)
Pt c/o left anterior thigh tenderness and burning x3 days. Pt concerned for blood clot.   Pain unchanged by palpation or walking. Denies hx of sx.

## 2019-12-22 NOTE — ED Notes (Signed)
ED Provider at bedside. 

## 2019-12-22 NOTE — Discharge Instructions (Addendum)
Pain sounds like nerve pain.  No evidence of blood clot in the left leg.  No obvious evidence of any infection.  Could be pinched nerve in the upper lumbar part of the back.  Take the tramadol as needed for pain relief make an appointment to follow-up with your primary care doctor as well as orthopedics.  Return for any new or worse symptoms.  No evidence of any blood clot no evidence of any arterial blood flow in problem as well.

## 2019-12-22 NOTE — ED Provider Notes (Signed)
[facility] EMERGENCY DEPARTMENT Provider Note   CSN: [id number] Arrival date & time: 12/22/19  1507     History Chief Complaint  Patient presents with  . Leg Pain    Greg Mann is a 74 y.o. male.  Patient with complaint of left anterior thigh tenderness and a burning sensation for about a week.  Patient was concerned about a blood clot.  No swelling to the ankles.  No history of blood clots.  Denies any back pain.  No redness or rash.  Patient states that is not made worse or better by walking.  No prior history of anything similar.  Past medical history significant for atrial fibrillation and thrombocytopenia.  Patient is on flecainide.  And aspirin.  Not on any formal blood thinner.        Past Medical History:  Diagnosis Date  . Borderline hyperlipidemia   . Chest pain   . Dysrhythmia   . Exercise-induced tachycardia   . Ganglion    left wrist  . GERD (gastroesophageal reflux disease)   . Thrombocytopenia ([place])    borderline    Patient Active Problem List   Diagnosis Date Noted  . Atrial flutter ([place]) 09/22/2013  . Paroxysmal atrial fibrillation ([place]) 04/23/2013  . Rapid palpitations 02/26/2013  . FATIGUE 10/24/2009  . THROMBOCYTOPENIA 10/11/2009  . Chest pain 10/11/2009    Past Surgical History:  Procedure Laterality Date  . COLONOSCOPY N/A 04/22/2018   Procedure: COLONOSCOPY;  Surgeon: Daneil Dolin, MD;  [facility];  Service: Endoscopy;  Laterality: N/A;  8:30  . GANGLION CYST EXCISION     Left Wrist  . KNEE ARTHROSCOPY     Left  . TONSILLECTOMY         Family History  Problem Relation Age of Onset  . Aneurysm Father        Aortic  . Coronary artery disease Brother        PCI    Social History   Tobacco Use  . Smoking status: Never Smoker  . Smokeless tobacco: Never Used  Substance Use Topics  . Alcohol use: Yes    Alcohol/week: 0.0 standard drinks    Comment: Occasional wine  . Drug use: No    Home  Medications Prior to Admission medications   Medication Sig Start Date End Date Taking? Authorizing Provider  aspirin EC 81 MG tablet Take 81 mg by mouth daily.   Yes [provider]  flecainide (TAMBOCOR) 100 MG tablet TAKE (1) TABLET BY MOUTH ONCE DAILY. Patient taking differently: Take 100 mg by mouth in the morning.  11/29/19  Yes Evans Lance, MD  Multiple Vitamin (MULTIVITAMIN) tablet Take 1 tablet by mouth daily.     Yes [provider]  traMADol (ULTRAM) 50 MG tablet Take 1 tablet (50 mg total) by mouth every 6 (six) hours as needed. 12/22/19   Fredia Sorrow, MD    Allergies    Patient has no known allergies.  Review of Systems   Review of Systems  Constitutional: Negative for chills and fever.  HENT: Negative for congestion, rhinorrhea and sore throat.   Eyes: Negative for visual disturbance.  Respiratory: Negative for cough and shortness of breath.   Cardiovascular: Negative for chest pain and leg swelling.  Gastrointestinal: Negative for abdominal pain, diarrhea, nausea and vomiting.  Genitourinary: Negative for dysuria.  Musculoskeletal: Negative for back pain and neck pain.  Skin: Negative for rash.  Neurological: Negative for dizziness, light-headedness and headaches.  Hematological: Does not bruise/bleed easily.  Psychiatric/Behavioral: Negative for confusion.    Physical Exam Updated Vital Signs BP (!) 110/48   Pulse 60   Temp 97.9 F (36.6 C) (Oral)   Resp 16   Ht 1.803 m (5\' 11" )   Wt 77.1 kg   SpO2 100%   BMI 23.71 kg/m   Physical Exam Vitals and nursing note reviewed.  Constitutional:      Appearance: Normal appearance. He is well-developed.  HENT:     Head: Normocephalic and atraumatic.  Eyes:     Conjunctiva/sclera: Conjunctivae normal.     Pupils: Pupils are equal, round, and reactive to light.  Cardiovascular:     Rate and Rhythm: Normal rate and regular rhythm.     Heart sounds: No murmur.  Pulmonary:     Effort:  Pulmonary effort is normal. No respiratory distress.     Breath sounds: Normal breath sounds.  Abdominal:     Palpations: Abdomen is soft.     Tenderness: There is no abdominal tenderness.  Musculoskeletal:        General: No swelling, tenderness, deformity or signs of injury. Normal range of motion.     Cervical back: Normal range of motion and neck supple.     Comments: Left anterior thigh without any redness or tenderness to palpation.  Patient feels as if there is a fullness but cannot be appreciated.  No swelling at the knee.  No swelling at the ankle.  Dorsalis pedis pulse is 1-2+.  Good cap refill.  Sensation intact to the distal foot.  Right lower extremity without any abnormalities.  Skin:    General: Skin is warm and dry.     Capillary Refill: Capillary refill takes less than 2 seconds.  Neurological:     General: No focal deficit present.     Mental Status: He is alert and oriented to person, place, and time.     Cranial Nerves: No cranial nerve deficit.     Sensory: No sensory deficit.     Motor: No weakness.     ED Results / Procedures / Treatments   Labs (all labs ordered are listed, but only abnormal results are displayed) Labs Reviewed - No data to display  EKG None  Radiology DG Lumbar Spine Complete  Result Date: 12/22/2019 CLINICAL DATA:  Low back pain and left leg pain, initial encounter EXAM: LUMBAR SPINE - COMPLETE 4+ VIEW COMPARISON:  None. FINDINGS: Five lumbar type vertebral bodies are well visualized. Vertebral body height is well maintained. No pars defects are seen. Degenerative osteophytic changes are seen. Multilevel disc space narrowing from L2-L5 is noted. No soft tissue abnormality is seen. IMPRESSION: Multilevel degenerative change without acute abnormality. Electronically Signed   By: Inez Catalina M.D.   On: 12/22/2019 17:26   US Venous Img Lower  Left (DVT Study)  Result Date: 12/22/2019 CLINICAL DATA:  Pain x2 weeks, edema EXAM: LEFT LOWER  EXTREMITY VENOUS DOPPLER ULTRASOUND TECHNIQUE: Gray-scale sonography with compression, as well as color and duplex ultrasound, were performed to evaluate the deep venous system(s) from the level of the common femoral vein through the popliteal and proximal calf veins. COMPARISON:  09/16/2016 FINDINGS: VENOUS Normal compressibility of the common femoral, superficial femoral, and popliteal veins, as well as the visualized calf veins. Visualized portions of profunda femoral vein and great saphenous vein unremarkable. No filling defects to suggest DVT on grayscale or color Doppler imaging. Doppler waveforms show normal direction of venous flow, normal respiratory phasicity  and response to augmentation. Limited views of the contralateral common femoral vein are unremarkable. OTHER None. Limitations: none IMPRESSION: No femoropopliteal DVT nor evidence of DVT within the visualized calf veins. If clinical symptoms are inconsistent or if there are persistent or worsening symptoms, further imaging (possibly involving the iliac veins) may be warranted. Electronically Signed   By: Lucrezia Europe M.D.   On: 12/22/2019 16:26    Procedures Procedures (including critical care time)  Medications Ordered in ED Medications - No data to display  ED Course  I have reviewed the triage vital signs and the nursing notes.  Pertinent labs & imaging results that were available during my care of the patient were reviewed by me and considered in my medical decision making (see chart for details).    MDM Rules/Calculators/A&P                      Symptoms being burning in nature are suggestive of nerve pain.  Doppler study done no evidence of any deep vein thrombosis.  There appears to be no arterial insufficiency to the leg.  Certainly nothing consistent with claudication because it is not made worse by walking.  X-rays of the lumbar back do show a lot of degenerative changes.  There has been no fall or trauma.  Will treat with a  course of tramadol follow-up with primary care doctor and referral to orthopedics.  In retrospect probably should have gotten an x-ray of the left thigh.  But no obvious deformity.  Does have orthopedic follow-up.  Patient wants to be discharged.     Final Clinical Impression(s) / ED Diagnoses Final diagnoses:  Acute thigh pain, left    Rx / DC Orders ED Discharge Orders         Ordered    traMADol (ULTRAM) 50 MG tablet  Every 6 hours PRN     12/22/19 1801           Fredia Sorrow, MD 12/22/19 1817

## 2019-12-28 DIAGNOSIS — M79605 Pain in left leg: Secondary | ICD-10-CM | POA: Diagnosis not present

## 2020-01-18 ENCOUNTER — Other Ambulatory Visit: Payer: Self-pay | Admitting: Internal Medicine

## 2020-01-18 ENCOUNTER — Other Ambulatory Visit (HOSPITAL_COMMUNITY): Payer: Self-pay | Admitting: Internal Medicine

## 2020-01-18 DIAGNOSIS — M545 Low back pain, unspecified: Secondary | ICD-10-CM

## 2020-02-04 ENCOUNTER — Other Ambulatory Visit: Payer: Self-pay

## 2020-02-04 ENCOUNTER — Ambulatory Visit (HOSPITAL_COMMUNITY)
Admission: RE | Admit: 2020-02-04 | Discharge: 2020-02-04 | Disposition: A | Discharge status: Home / Self Care | Payer: Medicare Other | Source: Ambulatory Visit | Attending: Internal Medicine | Admitting: Internal Medicine

## 2020-02-04 DIAGNOSIS — M545 Low back pain, unspecified: Secondary | ICD-10-CM

## 2020-02-25 ENCOUNTER — Other Ambulatory Visit: Payer: Self-pay | Admitting: Internal Medicine

## 2020-02-28 ENCOUNTER — Ambulatory Visit (INDEPENDENT_AMBULATORY_CARE_PROVIDER_SITE_OTHER): Payer: Medicare Other | Admitting: Orthopedic Surgery

## 2020-02-28 ENCOUNTER — Encounter: Payer: Self-pay | Admitting: Orthopedic Surgery

## 2020-02-28 ENCOUNTER — Other Ambulatory Visit: Payer: Self-pay

## 2020-02-28 VITALS — BP 141/79 | HR 67 | Ht 71.0 in | Wt 179.0 lb

## 2020-02-28 DIAGNOSIS — G5792 Unspecified mononeuropathy of left lower limb: Secondary | ICD-10-CM

## 2020-02-28 MED ORDER — GABAPENTIN 100 MG PO CAPS: 100.0000 mg | ORAL_CAPSULE | Freq: Three times a day (TID) | ORAL | 1 refills | Status: DC

## 2020-02-28 NOTE — Addendum Note (Signed)
Addended byCandice Camp on: 02/28/2020 09:08 AM   Modules accepted: Orders

## 2020-02-28 NOTE — Progress Notes (Signed)
NEW PROBLEM//OFFICE VISIT  Chief Complaint  Patient presents with   Back Pain    has pain in left thigh, approx 6 weeks had xray MRI lumbar spine    74 yo male 6-8 weeks left high pain , feels like something is crawling under the skin, no trauma , intermittent back pain non radiating occasional stiffness resolves with activity. Tried meloxicam, no improvement.  Pain located lateral thigh hip to knee      Review of Systems  All other systems reviewed and are negative.    Past Medical History:  Diagnosis Date   Borderline hyperlipidemia    Chest pain    Dysrhythmia    Exercise-induced tachycardia    Ganglion    left wrist   GERD (gastroesophageal reflux disease)    Thrombocytopenia (HCC)    borderline    Past Surgical History:  Procedure Laterality Date   COLONOSCOPY N/A 04/22/2018   Procedure: COLONOSCOPY;  Surgeon: Daneil Dolin, MD;  [facility];  Service: Endoscopy;  Laterality: N/A;  8:30   GANGLION CYST EXCISION     Left Wrist   KNEE ARTHROSCOPY     Left   TONSILLECTOMY      Family History  Problem Relation Age of Onset   Aneurysm Father        Aortic   Coronary artery disease Brother        PCI   Social History   Tobacco Use   Smoking status: Never Smoker   Smokeless tobacco: Never Used  Vaping Use   Vaping Use: Never used  Substance Use Topics   Alcohol use: Yes    Alcohol/week: 0.0 standard drinks    Comment: Occasional wine   Drug use: No    No Known Allergies  Current Meds  Medication Sig   aspirin EC 81 MG tablet Take 81 mg by mouth daily.   flecainide (TAMBOCOR) 100 MG tablet Take 1 tablet (100 mg total) by mouth in the morning.   Multiple Vitamin (MULTIVITAMIN) tablet Take 1 tablet by mouth daily.     [DISCONTINUED] traMADol (ULTRAM) 50 MG tablet Take 1 tablet (50 mg total) by mouth every 6 (six) hours as needed.    BP (!) 141/79    Pulse 67    Ht 5\' 11"  (1.803 m)    Wt 179 lb (81.2 kg)    BMI  24.97 kg/m   Physical Exam Vitals and nursing note reviewed.  Constitutional:      Appearance: Normal appearance.  Musculoskeletal:     Lumbar back: Negative right straight leg raise test and negative left straight leg raise test.  Neurological:     Mental Status: He is alert and oriented to person, place, and time.  Psychiatric:        Mood and Affect: Mood normal.     Right Knee Exam  Right knee exam is normal.   Left Knee Exam  Left knee exam is normal.   Right Hip Exam  Right hip exam is normal.   Muscle Strength  The patient has normal right hip strength.  Other  Erythema: absent Scars: absent Sensation: normal Pulse: present   Left Hip Exam   Tenderness  Left hip tenderness location: lateral thigh   Range of Motion  The patient has normal left hip ROM.  Muscle Strength  The patient has normal left hip strength.   Other  Erythema: absent Scars: absent Sensation: normal Pulse: present   Back Exam   Tenderness  The patient is experiencing no tenderness.   Tests  Straight leg raise right: negative Straight leg raise left: negative  Other  Gait: normal         MEDICAL DECISION MAKING  A.  Encounter Diagnosis  Name Primary?   Neuropathy of left lower extremity Yes    B. DATA ANALYSED:  IMAGING: Independent interpretation of images: Lumbar spine films x3 show loss of lumbar lordosis multilevel osteophytes around the lumbar disks with slight joint space narrowing  His MRI shows multilevel disc disease mild protruding discs no neural impingement or spinal stenosis  Orders: Physical therapy ordered  Outside records reviewed: Dr. Juel Burrow notes indicate he give him some meloxicam after the ER visit  C. MANAGEMENT   Unclear if this is related to lumbar disc disease or an isolated lateral nerve injury or impingement does not appear to be meralgia paresthetica as the pain is more lateral  Recommend gabapentin 100 mg 3 times daily  for 4 weeks and physical therapy with a follow-up schedule IV weeks.  Meds ordered this encounter  Medications   gabapentin (NEURONTIN) 100 MG capsule    Sig: Take 1 capsule (100 mg total) by mouth 3 (three) times daily.    Dispense:  42 capsule    Refill:  1      Arther Abbott, MD  02/28/2020 8:50 AM

## 2020-03-15 ENCOUNTER — Other Ambulatory Visit: Payer: Self-pay

## 2020-03-15 ENCOUNTER — Encounter (HOSPITAL_COMMUNITY): Payer: Self-pay | Admitting: Physical Therapy

## 2020-03-15 ENCOUNTER — Ambulatory Visit (HOSPITAL_COMMUNITY): Payer: Medicare Other | Attending: Orthopedic Surgery | Admitting: Physical Therapy

## 2020-03-15 DIAGNOSIS — R29898 Other symptoms and signs involving the musculoskeletal system: Secondary | ICD-10-CM | POA: Diagnosis not present

## 2020-03-15 DIAGNOSIS — R293 Abnormal posture: Secondary | ICD-10-CM | POA: Insufficient documentation

## 2020-03-15 DIAGNOSIS — M6281 Muscle weakness (generalized): Secondary | ICD-10-CM | POA: Diagnosis not present

## 2020-03-15 DIAGNOSIS — R2689 Other abnormalities of gait and mobility: Secondary | ICD-10-CM

## 2020-03-15 DIAGNOSIS — M545 Low back pain, unspecified: Secondary | ICD-10-CM

## 2020-03-15 NOTE — Patient Instructions (Signed)
Access Code: [email or web address] URL: [email or web address] Date: 03/15/2020 Prepared by: Mitzi Hansen Hersel Mcmeen  Exercises Seated Flexion Stretch - 5 x daily - 7 x weekly - 2 sets - 10 reps

## 2020-03-15 NOTE — Therapy (Signed)
[facility] [place], [place], [address] Phone: [phone]   Fax:  [phone]  Physical Therapy Evaluation  Patient Details  Name: Greg Mann MRN: [id number] Date of Birth: 11-18-1945 Referring Provider (PT): Arther Abbott MD   Encounter Date: 03/15/2020   PT End of Session - 03/15/20 1206    Visit Number 1    Number of Visits 12    Date for PT Re-Evaluation 04/26/20    Authorization Type Primary: Medicare Secondary BCBS supplement (follow med guidlines, no auth)    Progress Note Due on Visit 10    PT Start Time 1121    PT Stop Time 1201    PT Time Calculation (min) 40 min    Activity Tolerance Patient tolerated treatment well    Behavior During Therapy [facility] for tasks assessed/performed           Past Medical History:  Diagnosis Date  . Borderline hyperlipidemia   . Chest pain   . Dysrhythmia   . Exercise-induced tachycardia   . Ganglion    left wrist  . GERD (gastroesophageal reflux disease)   . Thrombocytopenia ([place])    borderline    Past Surgical History:  Procedure Laterality Date  . COLONOSCOPY N/A 04/22/2018   Procedure: COLONOSCOPY;  Surgeon: Daneil Dolin, MD;  [facility];  Service: Endoscopy;  Laterality: N/A;  8:30  . GANGLION CYST EXCISION     Left Wrist  . KNEE ARTHROSCOPY     Left  . TONSILLECTOMY      There were no vitals filed for this visit.    Subjective Assessment - 03/15/20 1122    Subjective Patient is a 74 y.o. male who presents to physical therapy with c/o neuropathy in LLE. He states burning sensation in L thigh that feels like someone clawing inside his thigh. He states insidious onset of symptoms several months ago. Symptoms increase with activity, walking, standing. Symptoms decrease with sitting. Symptoms are constant but change in intensity. He has low back pain when he gets up in the morning but doesn't bother him throughout the day. Patient states his  main goal is eliminate pain.    Pertinent History Low back pain    Limitations Walking;Standing;House hold activities    How long can you walk comfortably? 15 minutes    Patient Stated Goals eliminate pain    Currently in Pain? Yes    Pain Score 1    worst 7-8/10   Pain Location Leg    Pain Orientation Left    Pain Descriptors / Indicators Burning              [facility] PT Assessment - 03/15/20 0001      Assessment   Medical Diagnosis Neuropathy of LLE/ Lumbar    Referring Provider (PT) Arther Abbott MD    Onset Date/Surgical Date 11/14/19    Next MD Visit Next week    Prior Therapy for knee surgery      Precautions   Precautions None      Restrictions   Weight Bearing Restrictions No      Balance Screen   Has the patient fallen in the past 6 months No    Has the patient had a decrease in activity level because of a fear of falling?  No    Is the patient reluctant to leave their home because of a fear of falling?  No      Prior Function  Level of Independence Independent    Vocation Full time employment    Comptroller   Overall Cognitive Status Within Functional Limits for tasks assessed      Observation/Other Assessments   Observations Ambulate without AD    Focus on Therapeutic Outcomes (FOTO)  not completed      Sensation   Light Touch Appears Intact      Posture/Postural Control   Posture/Postural Control Postural limitations    Postural Limitations Rounded Shoulders;Forward head;Decreased lumbar lordosis      ROM / Strength   AROM / PROM / Strength AROM;Strength      AROM   Overall AROM Comments tender in back with AROM no change in leg    AROM Assessment Site Lumbar    Lumbar Flexion 25% limited no change in symptoms    Lumbar Extension 75% limited no change in symptoms    Lumbar - Right Side Bend 50% limited no change in symptoms    Lumbar - Left Side Bend 50% limited no change in symptoms    Lumbar -  Right Rotation 25% limited no change in symptoms    Lumbar - Left Rotation 25% limited no change in symptoms      Strength   Strength Assessment Site Hip;Knee;Ankle    Right/Left Hip Right;Left    Right Hip Flexion 4/5    Left Hip Flexion 4/5    Right/Left Knee Right;Left    Right Knee Flexion 4+/5    Right Knee Extension 4+/5    Left Knee Flexion 4+/5    Left Knee Extension 4+/5    Right/Left Ankle Right;Left    Right Ankle Dorsiflexion 5/5    Left Ankle Dorsiflexion 5/5      Palpation   Spinal mobility grossly hypomoble thoracic and lumbar spine with greatest tenderness at L3-L5    Palpation comment overactive lumbar and thoracic paraspinals tender in Lower lumbar spine, no tenderness in in bilateral glutes, piriformis, hamstrings      Special Tests   Other special tests Femoral nerve tension test: positive LLE, negative RLE      Ambulation/Gait   Ambulation/Gait Yes    Ambulation/Gait Assistance 7: Independent    Ambulation Distance (Feet) 425 Feet    Assistive device None    Gait Pattern Trunk flexed    Ambulation Surface Level;Indoor    Gait Comments 2 MWT                      Objective measurements completed on examination: See above findings.       [facility] Adult PT Treatment/Exercise - 03/15/20 0001      Exercises   Exercises Lumbar      Lumbar Exercises: Stretches   Prone on Elbows Stretch 5 reps;30 seconds    Other Lumbar Stretch Exercise seated lumbar flexion 2x10                  PT Education - 03/15/20 1122    Education Details Patient educated on exam findings, POC, scope of PT, low back pathology, initial HEP, dosing of exercises    Person(s) Educated Patient    Methods Explanation;Demonstration;Handout    Comprehension Verbalized understanding;Returned demonstration            PT Short Term Goals - 03/15/20 1219      PT SHORT TERM GOAL #1   Title Patient will be independent with HEP in order to improve functional  outcomes.    Time 3    Period Weeks    Status New    Target Date 04/05/20      PT SHORT TERM GOAL #2   Title Patient will report at least 25% improvement in symptoms for improved quality of life.    Time 3    Period Weeks    Status New    Target Date 04/05/20             PT Long Term Goals - 03/15/20 1220      PT LONG TERM GOAL #1   Title Patient will report at least 75% improvement in symptoms for improved quality of life.    Time 6    Period Weeks    Status New    Target Date 04/26/20      PT LONG TERM GOAL #2   Title Patient will be able to ambulate for at least 30 minutes with pain no greater than 1/10 in order to demonstrate improved ability to ambulate in the community.    Time 6    Period Weeks    Status New    Target Date 04/26/20      PT LONG TERM GOAL #3   Title Patient will report centralized symptoms no greater than 1/10 with all ADL in order to improve all functional mobility.    Time 6    Period Weeks    Status New    Target Date 04/26/20                  Plan - 03/15/20 1208    Clinical Impression Statement Patient is a 74 y.o. male who presents to physical therapy with c/o neuropathy in LLE and LBP. He presents with pain limited deficits in lumbar and LE strength, ROM, endurance, postural impairments, spinal mobility, gait, neural tension, and functional mobility with ADL. Patient symptoms provoked with prone positioning today as well as femoral nerve tension test. Patient has very hypomobile thoracic and lumbar spine and tends to hold trunk in flexion with seated and with standing/gait. Patient experiences increase in symptoms with prone on elbows which decreases with returning to prone position. He has very restricted lumbar extension mobility. Repeated POE had no change in symptoms. Trialed repeated flexion with no current symptoms in seated and added to HEP to assess response. Educated patient on dosing. He is having to modify and restrict ADL  as indicated by subjective information and objective measures which is affecting overall participation. Patient will benefit from skilled physical therapy in order to improve function and reduce impairment.    Personal Factors and Comorbidities Age;Comorbidity 2;Time since onset of injury/illness/exacerbation;Profession;Past/Current Experience    Comorbidities LBP, A fib    Examination-Activity Limitations Bed Mobility;Locomotion Level;Stand;Lift    Examination-Participation Restrictions Cleaning;Yard Work;Volunteer;Shop    Stability/Clinical Decision Making Evolving/Moderate complexity    Clinical Decision Making Moderate    Rehab Potential Good    PT Frequency 2x / week    PT Duration 6 weeks    PT Treatment/Interventions ADLs/Self Care Home Management;Aquatic Therapy;Biofeedback;Cryotherapy;Electrical Stimulation;Iontophoresis 4mg /ml Dexamethasone;Moist Heat;Traction;Ultrasound;Fluidtherapy;Contrast Bath;DME Instruction;Gait training;Stair training;Functional mobility training;Therapeutic activities;Therapeutic exercise;Balance training;Neuromuscular re-education;Patient/family education;Orthotic Fit/Training;Manual techniques;Manual lymph drainage;Compression bandaging;Passive range of motion;Dry needling;Energy conservation;Splinting;Taping;Vasopneumatic Device;Spinal Manipulations;Joint Manipulations    PT Next Visit Plan assess response to repeated flexion and continue or discontinue based on response, possibly SKTC/DKTC, possibly manual therapy for mobility and pain with mobs/stm, trial femoral nerve glides, hip and core strengtheing, postural strengthening    PT Home Exercise Plan  7/7 repeated flexion in seated    Consulted and Agree with Plan of Care Patient           Patient will benefit from skilled therapeutic intervention in order to improve the following deficits and impairments:  Abnormal gait, Decreased activity tolerance, Decreased mobility, Decreased endurance, Difficulty  walking, Decreased range of motion, Hypomobility, Postural dysfunction, Improper body mechanics, Pain, Increased fascial restricitons, Increased muscle spasms, Impaired flexibility  Visit Diagnosis: Low back pain, unspecified back pain laterality, unspecified chronicity, unspecified whether sciatica present  Muscle weakness (generalized)  Other abnormalities of gait and mobility  Abnormal posture  Other symptoms and signs involving the musculoskeletal system     Problem List Patient Active Problem List   Diagnosis Date Noted  . Atrial flutter ([place]) 09/22/2013  . Paroxysmal atrial fibrillation ([place]) 04/23/2013  . Rapid palpitations 02/26/2013  . FATIGUE 10/24/2009  . THROMBOCYTOPENIA 10/11/2009  . Chest pain 10/11/2009   12:23 PM, 03/15/20 Mearl Latin PT, DPT Physical Therapist at [facility] [place], [place], [address] Phone: [phone]   Fax:  [phone]  Name: Greg Mann MRN: [id number] Date of Birth: [birth year]

## 2020-03-17 ENCOUNTER — Ambulatory Visit (HOSPITAL_COMMUNITY): Payer: Medicare Other | Admitting: Physical Therapy

## 2020-03-17 ENCOUNTER — Other Ambulatory Visit: Payer: Self-pay

## 2020-03-17 DIAGNOSIS — R29898 Other symptoms and signs involving the musculoskeletal system: Secondary | ICD-10-CM | POA: Diagnosis not present

## 2020-03-17 DIAGNOSIS — M6281 Muscle weakness (generalized): Secondary | ICD-10-CM

## 2020-03-17 DIAGNOSIS — R293 Abnormal posture: Secondary | ICD-10-CM | POA: Diagnosis not present

## 2020-03-17 DIAGNOSIS — M545 Low back pain, unspecified: Secondary | ICD-10-CM

## 2020-03-17 DIAGNOSIS — R2689 Other abnormalities of gait and mobility: Secondary | ICD-10-CM | POA: Diagnosis not present

## 2020-03-17 NOTE — Therapy (Signed)
[place], [place], [address] Phone: [phone]   Fax:  [phone]  Physical Therapy Treatment  Patient Details  Name: Greg Mann MRN: [id number] Date of Birth: [birth year] Referring Provider (PT): Greg Abbott MD   Encounter Date: 03/17/2020   PT End of Session - 03/17/20 0848    Visit Number 2    Number of Visits 12    Date for PT Re-Evaluation 04/26/20    Authorization Type Primary: Medicare Secondary BCBS supplement (follow med guidlines, no auth)    Progress Note Due on Visit 10    PT Start Time 0830    PT Stop Time 0910    PT Time Calculation (min) 40 min    Activity Tolerance Patient tolerated treatment well    Behavior During Therapy [facility] for tasks assessed/performed           Past Medical History:  Diagnosis Date   Borderline hyperlipidemia    Chest pain    Dysrhythmia    Exercise-induced tachycardia    Ganglion    left wrist   GERD (gastroesophageal reflux disease)    Thrombocytopenia ([place])    borderline    Past Surgical History:  Procedure Laterality Date   COLONOSCOPY N/A 04/22/2018   Procedure: COLONOSCOPY;  Surgeon: Greg Dolin, MD;  [facility];  Service: Endoscopy;  Laterality: N/A;  8:30   GANGLION CYST EXCISION     Left Wrist   KNEE ARTHROSCOPY     Left   TONSILLECTOMY      There were no vitals filed for this visit.   Subjective Assessment - 03/17/20 0830    Subjective Greg Mann states that his pain is mostly in the morning when he wakes up and then it decreases. HIs pain this morning was an 8/10, he has been completintg the exercise given to him.    Pertinent History Low back pain    Limitations Walking;Standing;House hold activities    How long can you walk comfortably? 15 minutes    Patient Stated Goals eliminate pain    Currently in Pain? Yes    Pain Score 1     Pain Location Leg    Pain Orientation Left    Pain Descriptors /  Indicators Burning    Pain Type Chronic pain    Pain Radiating Towards knee    Pain Onset More than a month ago    Pain Frequency Intermittent    Aggravating Factors  sleeping    Pain Relieving Factors moving                             [facility] Adult PT Treatment/Exercise - 03/17/20 0001      Exercises   Exercises Lumbar      Lumbar Exercises: Stretches   Active Hamstring Stretch Right;Left;3 reps;30 seconds    Single Knee to Chest Stretch Right;Left;3 reps;30 seconds    Double Knee to Chest Stretch 3 reps;30 seconds    Quad Stretch Right;Left;3 reps;30 seconds      Lumbar Exercises: Supine   Other Supine Lumbar Exercises decompression exercises                   PT Education - 03/17/20 0848    Education Details HEP            PT Short Term Goals - 03/17/20 0855      PT SHORT TERM GOAL #  1   Title Patient will be independent with HEP in order to improve functional outcomes.    Time 3    Period Weeks    Status On-going    Target Date 04/05/20      PT SHORT TERM GOAL #2   Title Patient will report at least 25% improvement in symptoms for improved quality of life.    Time 3    Period Weeks    Status On-going    Target Date 04/05/20             PT Long Term Goals - 03/17/20 0855      PT LONG TERM GOAL #1   Title Patient will report at least 75% improvement in symptoms for improved quality of life.    Time 6    Period Weeks    Status On-going      PT LONG TERM GOAL #2   Title Patient will be able to ambulate for at least 30 minutes with pain no greater than 1/10 in order to demonstrate improved ability to ambulate in the community.    Time 6    Period Weeks    Status On-going      PT LONG TERM GOAL #3   Title Patient will report centralized symptoms no greater than 1/10 with all ADL in order to improve all functional mobility.    Time 6    Period Weeks    Status On-going                 Plan - 03/17/20 0848     Clinical Impression Statement Evaluation and goals reviewed with PT.  PT introduced to deccompression, postural and flexion based exercises with verbal cuing needed for proper technique.  PT tolerated all exercises well.    Personal Factors and Comorbidities Age;Comorbidity 2;Time since onset of injury/illness/exacerbation;Profession;Past/Current Experience    Comorbidities LBP, A fib    Examination-Activity Limitations Bed Mobility;Locomotion Level;Stand;Lift    Examination-Participation Restrictions Cleaning;Yard Work;Volunteer;Shop    Stability/Clinical Decision Making Evolving/Moderate complexity    Rehab Potential Good    PT Frequency 2x / week    PT Duration 6 weeks    PT Treatment/Interventions ADLs/Self Care Home Management;Aquatic Therapy;Biofeedback;Cryotherapy;Electrical Stimulation;Iontophoresis 4mg /ml Dexamethasone;Moist Heat;Traction;Ultrasound;Fluidtherapy;Contrast Bath;DME Instruction;Gait training;Stair training;Functional mobility training;Therapeutic activities;Therapeutic exercise;Balance training;Neuromuscular re-education;Patient/family education;Orthotic Fit/Training;Manual techniques;Manual lymph drainage;Compression bandaging;Passive range of motion;Dry needling;Energy conservation;Splinting;Taping;Vasopneumatic Device;Spinal Manipulations;Joint Manipulations    PT Next Visit Plan possibly manual therapy for mobility and pain with mobs/stm, hip and core strengtheing, postural strengthening    PT Home Exercise Plan 7/7 repeated flexion in seated; 03/17/2020:  decompression, sitting tall with scapular retraction, knee to chest, double knee to chest, and quad stretch    Consulted and Agree with Plan of Care Patient           Patient will benefit from skilled therapeutic intervention in order to improve the following deficits and impairments:  Abnormal gait, Decreased activity tolerance, Decreased mobility, Decreased endurance, Difficulty walking, Decreased range of motion,  Hypomobility, Postural dysfunction, Improper body mechanics, Pain, Increased fascial restricitons, Increased muscle spasms, Impaired flexibility  Visit Diagnosis: Low back pain, unspecified back pain laterality, unspecified chronicity, unspecified whether sciatica present  Muscle weakness (generalized)  Other abnormalities of gait and mobility  Abnormal posture     Problem List Patient Active Problem List   Diagnosis Date Noted   Atrial flutter ([place]) 09/22/2013   Paroxysmal atrial fibrillation ([place]) 04/23/2013   Rapid palpitations 02/26/2013   FATIGUE 10/24/2009   THROMBOCYTOPENIA 10/11/2009  Chest pain 10/11/2009    Rayetta Humphrey, PT CLT [phone] 03/17/2020, 9:14 AM  [facility] [address] [place], [place], [address] Phone: [phone]   Fax:  [phone]  Name: Greg Mann MRN: [id number] Date of Birth: [birth year]

## 2020-03-22 ENCOUNTER — Ambulatory Visit (HOSPITAL_COMMUNITY): Payer: Medicare Other | Admitting: Physical Therapy

## 2020-03-22 ENCOUNTER — Other Ambulatory Visit: Payer: Self-pay

## 2020-03-22 DIAGNOSIS — R293 Abnormal posture: Secondary | ICD-10-CM

## 2020-03-22 DIAGNOSIS — M545 Low back pain, unspecified: Secondary | ICD-10-CM

## 2020-03-22 DIAGNOSIS — M6281 Muscle weakness (generalized): Secondary | ICD-10-CM | POA: Diagnosis not present

## 2020-03-22 DIAGNOSIS — R29898 Other symptoms and signs involving the musculoskeletal system: Secondary | ICD-10-CM | POA: Diagnosis not present

## 2020-03-22 DIAGNOSIS — R2689 Other abnormalities of gait and mobility: Secondary | ICD-10-CM

## 2020-03-22 NOTE — Therapy (Signed)
[place], [place], [address] Phone: [phone]   Fax:  [phone]  Physical Therapy Treatment  Patient Details  Name: Greg Mann MRN: [id number] Date of Birth: [birth year] Referring Provider (PT): Arther Abbott MD   Encounter Date: 03/22/2020   PT End of Session - 03/22/20 1550    Visit Number 3    Number of Visits 12    Date for PT Re-Evaluation 04/26/20    Authorization Type Primary: Medicare Secondary BCBS supplement (follow med guidlines, no auth)    Progress Note Due on Visit 10    PT Start Time 1452    PT Stop Time 1540    PT Time Calculation (min) 48 min    Activity Tolerance Patient tolerated treatment well    Behavior During Therapy [facility] for tasks assessed/performed           Past Medical History:  Diagnosis Date  . Borderline hyperlipidemia   . Chest pain   . Dysrhythmia   . Exercise-induced tachycardia   . Ganglion    left wrist  . GERD (gastroesophageal reflux disease)   . Thrombocytopenia ([place])    borderline    Past Surgical History:  Procedure Laterality Date  . COLONOSCOPY N/A 04/22/2018   Procedure: COLONOSCOPY;  Surgeon: Daneil Dolin, MD;  [facility];  Service: Endoscopy;  Laterality: N/A;  8:30  . GANGLION CYST EXCISION     Left Wrist  . KNEE ARTHROSCOPY     Left  . TONSILLECTOMY      There were no vitals filed for this visit.   Subjective Assessment - 03/22/20 1506    Subjective pt states he is still hurting worst in the morning at 4-5/10 and decreases as day goes on.    Currently 2-3/10 central lower back.    Currently in Pain? Yes    Pain Location Back    Pain Orientation Lower;Mid    Pain Descriptors / Indicators Burning                             [facility] Adult PT Treatment/Exercise - 03/22/20 0001      Lumbar Exercises: Stretches   Active Hamstring Stretch Right;Left;3 reps;30 seconds    Single Knee to Chest Stretch  Right;Left;3 reps;30 seconds    Double Knee to Chest Stretch 3 reps;30 seconds    Other Lumbar Stretch Exercise scap retraction (freeze and squeeze) 10X5"       Lumbar Exercises: Standing   Other Standing Lumbar Exercises hip excursions 10 reps with manual asisst      Lumbar Exercises: Seated   Other Seated Lumbar Exercises thoracic excursions with UE movement 5 reps each      Lumbar Exercises: Supine   Clam 10 reps    Bridge 10 reps    Straight Leg Raise 10 reps    Other Supine Lumbar Exercises decompression exercises       Manual Therapy   Manual Therapy Soft tissue mobilization    Manual therapy comments completed in prone at end of session    Soft tissue mobilization to bil lumbar paraspinals                    PT Short Term Goals - 03/17/20 0855      PT SHORT TERM GOAL #1   Title Patient will be independent with HEP in order to improve functional outcomes.  Time 3    Period Weeks    Status On-going    Target Date 04/05/20      PT SHORT TERM GOAL #2   Title Patient will report at least 25% improvement in symptoms for improved quality of life.    Time 3    Period Weeks    Status On-going    Target Date 04/05/20             PT Long Term Goals - 03/17/20 0855      PT LONG TERM GOAL #1   Title Patient will report at least 75% improvement in symptoms for improved quality of life.    Time 6    Period Weeks    Status On-going      PT LONG TERM GOAL #2   Title Patient will be able to ambulate for at least 30 minutes with pain no greater than 1/10 in order to demonstrate improved ability to ambulate in the community.    Time 6    Period Weeks    Status On-going      PT LONG TERM GOAL #3   Title Patient will report centralized symptoms no greater than 1/10 with all ADL in order to improve all functional mobility.    Time 6    Period Weeks    Status On-going                 Plan - 03/22/20 1551    Clinical Impression Statement Coninued  with established therex.  Pt unable to recall decompression exercises without verbal cues.  Began thoracic and lumbar mobility exercises.  pt with extreme tightness especially with rotation and lateral flexion.  Began core stab with cues in supine and progressed postural exercises in sitting.  pt with noted forward bent posturing with cues needed to correct.  Began manual this session with no real tightness palpated along lumbar paraspinals.    Personal Factors and Comorbidities Age;Comorbidity 2;Time since onset of injury/illness/exacerbation;Profession;Past/Current Experience    Comorbidities LBP, A fib    Examination-Activity Limitations Bed Mobility;Locomotion Level;Stand;Lift    Examination-Participation Restrictions Cleaning;Yard Work;Volunteer;Shop    Stability/Clinical Decision Making Evolving/Moderate complexity    Rehab Potential Good    PT Frequency 2x / week    PT Duration 6 weeks    PT Treatment/Interventions ADLs/Self Care Home Management;Aquatic Therapy;Biofeedback;Cryotherapy;Electrical Stimulation;Iontophoresis 4mg /ml Dexamethasone;Moist Heat;Traction;Ultrasound;Fluidtherapy;Contrast Bath;DME Instruction;Gait training;Stair training;Functional mobility training;Therapeutic activities;Therapeutic exercise;Balance training;Neuromuscular re-education;Patient/family education;Orthotic Fit/Training;Manual techniques;Manual lymph drainage;Compression bandaging;Passive range of motion;Dry needling;Energy conservation;Splinting;Taping;Vasopneumatic Device;Spinal Manipulations;Joint Manipulations    PT Next Visit Plan Continue to progress hip mobility and postural/core strengthening.    PT Home Exercise Plan 7/7 repeated flexion in seated; 03/17/2020:  decompression, sitting tall with scapular retraction, knee to chest, double knee to chest, and quad stretch    Consulted and Agree with Plan of Care Patient           Patient will benefit from skilled therapeutic intervention in order to  improve the following deficits and impairments:  Abnormal gait, Decreased activity tolerance, Decreased mobility, Decreased endurance, Difficulty walking, Decreased range of motion, Hypomobility, Postural dysfunction, Improper body mechanics, Pain, Increased fascial restricitons, Increased muscle spasms, Impaired flexibility  Visit Diagnosis: Low back pain, unspecified back pain laterality, unspecified chronicity, unspecified whether sciatica present  Muscle weakness (generalized)  Other abnormalities of gait and mobility  Abnormal posture     Problem List Patient Active Problem List   Diagnosis Date Noted  . Atrial flutter ([place]) 09/22/2013  . Paroxysmal atrial  fibrillation ([place]) 04/23/2013  . Rapid palpitations 02/26/2013  . FATIGUE 10/24/2009  . THROMBOCYTOPENIA 10/11/2009  . Chest pain 10/11/2009   Teena Irani, PTA/CLT [phone]  Teena Irani 03/22/2020, 4:01 PM  [place], [place], [address] Phone: [phone]   Fax:  [phone]  Name: Greg Mann MRN: [id number] Date of Birth: 01-17-1946

## 2020-03-24 ENCOUNTER — Other Ambulatory Visit: Payer: Self-pay

## 2020-03-24 ENCOUNTER — Ambulatory Visit (HOSPITAL_COMMUNITY): Payer: Medicare Other

## 2020-03-24 ENCOUNTER — Encounter (HOSPITAL_COMMUNITY): Payer: Self-pay

## 2020-03-24 DIAGNOSIS — R293 Abnormal posture: Secondary | ICD-10-CM

## 2020-03-24 DIAGNOSIS — R2689 Other abnormalities of gait and mobility: Secondary | ICD-10-CM | POA: Diagnosis not present

## 2020-03-24 DIAGNOSIS — M545 Low back pain, unspecified: Secondary | ICD-10-CM

## 2020-03-24 DIAGNOSIS — R29898 Other symptoms and signs involving the musculoskeletal system: Secondary | ICD-10-CM

## 2020-03-24 DIAGNOSIS — M6281 Muscle weakness (generalized): Secondary | ICD-10-CM | POA: Diagnosis not present

## 2020-03-24 NOTE — Therapy (Signed)
[place], [place], [address] Phone: [phone]   Fax:  [phone]  Physical Therapy Treatment  Patient Details  Name: Greg Mann MRN: [id number] Date of Birth: [birth year] Referring Provider (PT): Arther Abbott MD   Encounter Date: 03/24/2020   PT End of Session - 03/24/20 0837    Visit Number 4    Number of Visits 12    Date for PT Re-Evaluation 04/26/20    Authorization Type Primary: Medicare Secondary BCBS supplement (follow med guidlines, no auth)    Progress Note Due on Visit 10    PT Start Time 0831    PT Stop Time 0917    PT Time Calculation (min) 46 min    Activity Tolerance Patient tolerated treatment well    Behavior During Therapy [facility] for tasks assessed/performed           Past Medical History:  Diagnosis Date  . Borderline hyperlipidemia   . Chest pain   . Dysrhythmia   . Exercise-induced tachycardia   . Ganglion    left wrist  . GERD (gastroesophageal reflux disease)   . Thrombocytopenia ([place])    borderline    Past Surgical History:  Procedure Laterality Date  . COLONOSCOPY N/A 04/22/2018   Procedure: COLONOSCOPY;  Surgeon: Daneil Dolin, MD;  [facility];  Service: Endoscopy;  Laterality: N/A;  8:30  . GANGLION CYST EXCISION     Left Wrist  . KNEE ARTHROSCOPY     Left  . TONSILLECTOMY      There were no vitals filed for this visit.   Subjective Assessment - 03/24/20 0834    Subjective Pt stated he thinks he might have slept wrong last night, increased pain this morning.  Currently 3-4 LBP, radicular symptoms ending lateral knee.    Pertinent History Low back pain    Patient Stated Goals eliminate pain    Currently in Pain? Yes    Pain Score 4     Pain Location Back    Pain Orientation Lower    Pain Descriptors / Indicators Tender    Pain Type Chronic pain    Pain Radiating Towards knee    Pain Onset More than a month ago    Pain Frequency  Intermittent    Aggravating Factors  sleeping    Pain Relieving Factors moving              [facility] PT Assessment - 03/24/20 0001      Assessment   Medical Diagnosis Neuropathy of LLE/ Lumbar    Referring Provider (PT) Arther Abbott MD    Onset Date/Surgical Date 11/14/19    Next MD Visit Monday, July 19th    Prior Therapy for knee surgery                         [facility] Adult PT Treatment/Exercise - 03/24/20 0001      Posture/Postural Control   Posture/Postural Control Postural limitations    Postural Limitations Rounded Shoulders;Forward head;Decreased lumbar lordosis      Exercises   Exercises Lumbar      Lumbar Exercises: Stretches   Lower Trunk Rotation 5 reps;10 seconds    Standing Extension 10 reps    Standing Extension Limitations 3D hip excursion      Lumbar Exercises: Standing   Other Standing Lumbar Exercises hip excursions 10 reps with manual asisst      Lumbar Exercises: Seated  Other Seated Lumbar Exercises thoracic excursions with UE movement (cueing to reduce lumbar movement and increase thoracic mobility)    Other Seated Lumbar Exercises Wback 10x      Lumbar Exercises: Supine   Bridge 10 reps    Bridge Limitations 10" holds    Other Supine Lumbar Exercises decompression wiht RTB 10x 3" holds x 4                    PT Short Term Goals - 03/17/20 0855      PT SHORT TERM GOAL #1   Title Patient will be independent with HEP in order to improve functional outcomes.    Time 3    Period Weeks    Status On-going    Target Date 04/05/20      PT SHORT TERM GOAL #2   Title Patient will report at least 25% improvement in symptoms for improved quality of life.    Time 3    Period Weeks    Status On-going    Target Date 04/05/20             PT Long Term Goals - 03/17/20 0855      PT LONG TERM GOAL #1   Title Patient will report at least 75% improvement in symptoms for improved quality of life.    Time 6    Period  Weeks    Status On-going      PT LONG TERM GOAL #2   Title Patient will be able to ambulate for at least 30 minutes with pain no greater than 1/10 in order to demonstrate improved ability to ambulate in the community.    Time 6    Period Weeks    Status On-going      PT LONG TERM GOAL #3   Title Patient will report centralized symptoms no greater than 1/10 with all ADL in order to improve all functional mobility.    Time 6    Period Weeks    Status On-going                 Plan - 03/24/20 3710    Clinical Impression Statement Continued with spinal mobility exercises with exteme tightness noted with lateral flexion and rotation.  Added LTR  to improve lumbar mobility.  Educated importance of good seated posture, continues wiht seated posture strengthening exercises and added theraband with decompressoin exercises, min cueing for stability with task.  No reports of increased pain through session.    Personal Factors and Comorbidities Age;Comorbidity 2;Time since onset of injury/illness/exacerbation;Profession;Past/Current Experience    Comorbidities LBP, A fib    Examination-Activity Limitations Bed Mobility;Locomotion Level;Stand;Lift    Examination-Participation Restrictions Cleaning;Yard Work;Volunteer;Shop    Stability/Clinical Decision Making Evolving/Moderate complexity    Clinical Decision Making Moderate    Rehab Potential Good    PT Frequency 2x / week    PT Duration 6 weeks    PT Treatment/Interventions ADLs/Self Care Home Management;Aquatic Therapy;Biofeedback;Cryotherapy;Electrical Stimulation;Iontophoresis 4mg /ml Dexamethasone;Moist Heat;Traction;Ultrasound;Fluidtherapy;Contrast Bath;DME Instruction;Gait training;Stair training;Functional mobility training;Therapeutic activities;Therapeutic exercise;Balance training;Neuromuscular re-education;Patient/family education;Orthotic Fit/Training;Manual techniques;Manual lymph drainage;Compression bandaging;Passive range of  motion;Dry needling;Energy conservation;Splinting;Taping;Vasopneumatic Device;Spinal Manipulations;Joint Manipulations    PT Next Visit Plan Continue to progress hip mobility and postural/core strengthening.    PT Home Exercise Plan 7/7 repeated flexion in seated; 03/17/2020:  decompression, sitting tall with scapular retraction, knee to chest, double knee to chest, and quad stretch           Patient will benefit from skilled therapeutic  intervention in order to improve the following deficits and impairments:  Abnormal gait, Decreased activity tolerance, Decreased mobility, Decreased endurance, Difficulty walking, Decreased range of motion, Hypomobility, Postural dysfunction, Improper body mechanics, Pain, Increased fascial restricitons, Increased muscle spasms, Impaired flexibility  Visit Diagnosis: Low back pain, unspecified back pain laterality, unspecified chronicity, unspecified whether sciatica present  Muscle weakness (generalized)  Other abnormalities of gait and mobility  Abnormal posture  Other symptoms and signs involving the musculoskeletal system     Problem List Patient Active Problem List   Diagnosis Date Noted  . Atrial flutter ([place]) 09/22/2013  . Paroxysmal atrial fibrillation ([place]) 04/23/2013  . Rapid palpitations 02/26/2013  . FATIGUE 10/24/2009  . THROMBOCYTOPENIA 10/11/2009  . Chest pain 10/11/2009   Ihor Austin, LPTA/CLT; CBIS [phone]  Aldona Lento 03/24/2020, 9:33 AM  [facility] [address] [place], [place], [address] Phone: [phone]   Fax:  [phone]  Name: Greg Mann MRN: [id number] Date of Birth: 06-12-1946

## 2020-03-27 ENCOUNTER — Other Ambulatory Visit: Payer: Self-pay

## 2020-03-27 ENCOUNTER — Encounter: Payer: Self-pay | Admitting: Orthopedic Surgery

## 2020-03-27 ENCOUNTER — Ambulatory Visit (INDEPENDENT_AMBULATORY_CARE_PROVIDER_SITE_OTHER): Payer: Medicare Other | Admitting: Orthopedic Surgery

## 2020-03-27 VITALS — BP 152/80 | HR 73 | Ht 71.0 in | Wt 181.4 lb

## 2020-03-27 DIAGNOSIS — G5792 Unspecified mononeuropathy of left lower limb: Secondary | ICD-10-CM | POA: Diagnosis not present

## 2020-03-27 DIAGNOSIS — M5137 Other intervertebral disc degeneration, lumbosacral region: Secondary | ICD-10-CM

## 2020-03-27 NOTE — Progress Notes (Signed)
Chief Complaint  Patient presents with  . Leg Pain    L/burning sensation comes and goes/PT exercises seems to help at times  . Back Pain    hurts at times   74 year old male has had his MRI has multilevel degenerative disc disease see report below  He has made some improvement physical therapy exercises but still has intermittent numbness from his hip to his knee and L5 root  I went over his MRI with him again  We have decided to take his gabapentin at night since it is making him have some slight dizziness continue with his exercises and follow-up in 6 months   Encounter Diagnoses  Name Primary?  . Neuropathy of left lower extremity Yes  . DDD (degenerative disc disease), lumbosacral

## 2020-03-28 ENCOUNTER — Ambulatory Visit (HOSPITAL_COMMUNITY): Payer: Medicare Other

## 2020-03-28 DIAGNOSIS — M545 Low back pain, unspecified: Secondary | ICD-10-CM

## 2020-03-28 DIAGNOSIS — R2689 Other abnormalities of gait and mobility: Secondary | ICD-10-CM | POA: Diagnosis not present

## 2020-03-28 DIAGNOSIS — R293 Abnormal posture: Secondary | ICD-10-CM

## 2020-03-28 DIAGNOSIS — R29898 Other symptoms and signs involving the musculoskeletal system: Secondary | ICD-10-CM

## 2020-03-28 DIAGNOSIS — M6281 Muscle weakness (generalized): Secondary | ICD-10-CM | POA: Diagnosis not present

## 2020-03-28 NOTE — Patient Instructions (Signed)
side-lying clock left/right Counter clockWise/ClockWise for spinal rotation x6 each direction  Stomach lying 2 minutes Stomach lying on elbows 2 minutes Stomach press ups x8 Stomach lying 2 minutes  Log roll into and out of bed\  Use pillows under knees for lying on back  Use pillows in chest and between knee when side-lying

## 2020-03-28 NOTE — Therapy (Signed)
[place], [place], [address] Phone: [phone]   Fax:  [phone]  Physical Therapy Treatment  Patient Details  Name: Greg Mann MRN: [id number] Date of Birth: 1945/11/21 Referring Provider (PT): Arther Abbott MD   Encounter Date: 03/28/2020   PT End of Session - 03/28/20 1059    Visit Number 5    Number of Visits 12    Date for PT Re-Evaluation 04/26/20    Authorization Type Primary: Medicare Secondary BCBS supplement (follow med guidlines, no auth)    Progress Note Due on Visit 10    PT Start Time 0921    PT Stop Time 1003    PT Time Calculation (min) 42 min    Activity Tolerance Patient tolerated treatment well    Behavior During Therapy [facility] for tasks assessed/performed           Past Medical History:  Diagnosis Date  . Borderline hyperlipidemia   . Chest pain   . Dysrhythmia   . Exercise-induced tachycardia   . Ganglion    left wrist  . GERD (gastroesophageal reflux disease)   . Thrombocytopenia ([place])    borderline    Past Surgical History:  Procedure Laterality Date  . COLONOSCOPY N/A 04/22/2018   Procedure: COLONOSCOPY;  Surgeon: Daneil Dolin, MD;  [facility];  Service: Endoscopy;  Laterality: N/A;  8:30  . GANGLION CYST EXCISION     Left Wrist  . KNEE ARTHROSCOPY     Left  . TONSILLECTOMY      There were no vitals filed for this visit.   Subjective Assessment - 03/28/20 1057    Subjective Pt states he had leg pain when got up for the bathroom in the middle of the night but woke up without any left thigh pain this morning. Patient states he will be gone for 1 1/2 weeks in August so will have to move some of his appointments.    Pertinent History Low back pain    Patient Stated Goals eliminate pain    Currently in Pain? Yes    Pain Score 2     Pain Location Leg    Pain Orientation Left;Lateral;Anterior;Proximal    Pain Onset More than a month ago                [facility] Adult PT Treatment/Exercise - 03/28/20 0001      Posture/Postural Control   Posture/Postural Control Postural limitations    Postural Limitations Rounded Shoulders;Forward head;Decreased lumbar lordosis;Increased thoracic kyphosis      Lumbar Exercises: Stretches   Standing Extension 10 reps    Prone on Elbows Stretch 60 seconds;2 reps    Prone on Elbows Stretch Limitations w/ PA central and unilateral joint mobs Grade III-IV    Press Ups 10 reps;5 seconds    Press Ups Limitations --    Other Lumbar Stretch Exercise side-lying clock bilaterally CW/CCW for spinal rotation x6 each direction    Other Lumbar Stretch Exercise prone lying 2 minutes w/w/ PA central and unilateral joint mobs Grade III-IV      Lumbar Exercises: Supine   Other Supine Lumbar Exercises supine iliopsoas stretch bilateral 1 x 30"   aggravated left thigh radic and c/o his LBP             PT Education - 03/28/20 0955    Education Details Discussed technique and purpose throughout session. Added to HEP.    Person(s) Educated Patient  Methods Explanation;Demonstration;Handout    Comprehension Verbalized understanding;Need further instruction            PT Short Term Goals - 03/28/20 1106      PT SHORT TERM GOAL #1   Title Patient will be independent with HEP in order to improve functional outcomes.    Time 3    Period Weeks    Status On-going    Target Date 04/05/20      PT SHORT TERM GOAL #2   Title Patient will report at least 25% improvement in symptoms for improved quality of life.    Time 3    Period Weeks    Status On-going    Target Date 04/05/20             PT Long Term Goals - 03/28/20 1106      PT LONG TERM GOAL #1   Title Patient will report at least 75% improvement in symptoms for improved quality of life.    Time 6    Period Weeks    Status On-going      PT LONG TERM GOAL #2   Title Patient will be able to ambulate for at least 30 minutes with pain no  greater than 1/10 in order to demonstrate improved ability to ambulate in the community.    Time 6    Period Weeks    Status On-going      PT LONG TERM GOAL #3   Title Patient will report centralized symptoms no greater than 1/10 with all ADL in order to improve all functional mobility.    Time 6    Period Weeks    Status On-going              Plan - 03/28/20 1104    Clinical Impression Statement Continued with spinal mobility exercises with extreme tightness noted with lumbar spine extension and general lateral flexion and rotation of the spine. Added McKenzie exercises 1-3 in prone with manual central and unilateral Pas joint mobilizations to encourage segmental extension and rotation. Added side-lying spinal rotation exercises bilaterally to encourage segmental rotation, side-bending and extension, as well as anterior chest opening. Educated on log roll for proper bed mobility, as well as, use of pillows in supine and side-lying to reduce torque on spinal in sleeping positions. Patient reported soreness but no pain at end of session and was visibly standing more erect with less forward head/rounded shoulders posture. Continue with current plan. Review new HEP items next session. Progress as able.    Personal Factors and Comorbidities Age;Comorbidity 2;Time since onset of injury/illness/exacerbation;Profession;Past/Current Experience    Comorbidities LBP, A fib    Examination-Activity Limitations Bed Mobility;Locomotion Level;Stand;Lift    Examination-Participation Restrictions Cleaning;Yard Work;Volunteer;Shop    Stability/Clinical Decision Making Evolving/Moderate complexity    Rehab Potential Good    PT Frequency 2x / week    PT Duration 6 weeks    PT Treatment/Interventions ADLs/Self Care Home Management;Aquatic Therapy;Biofeedback;Cryotherapy;Electrical Stimulation;Iontophoresis 4mg /ml Dexamethasone;Moist Heat;Traction;Ultrasound;Fluidtherapy;Contrast Bath;DME Instruction;Gait  training;Stair training;Functional mobility training;Therapeutic activities;Therapeutic exercise;Balance training;Neuromuscular re-education;Patient/family education;Orthotic Fit/Training;Manual techniques;Manual lymph drainage;Compression bandaging;Passive range of motion;Dry needling;Energy conservation;Splinting;Taping;Vasopneumatic Device;Spinal Manipulations;Joint Manipulations    PT Next Visit Plan Continue to progress hip/spine mobility and postural/core strengthening. Review HEP from 7/20.    PT Home Exercise Plan 7/7 repeated flexion in seated; 03/17/2020:  decompression, sitting tall with scapular retraction, knee to chest, double knee to chest, and quad stretch; 03/28/20 - sidelying "clock bilat for rotation, SB, and ext, McKenzie 1-3 prone exs, log  roll, pillows use for sleeping           Patient will benefit from skilled therapeutic intervention in order to improve the following deficits and impairments:  Abnormal gait, Decreased activity tolerance, Decreased mobility, Decreased endurance, Difficulty walking, Decreased range of motion, Hypomobility, Postural dysfunction, Improper body mechanics, Pain, Increased fascial restricitons, Increased muscle spasms, Impaired flexibility  Visit Diagnosis: Low back pain, unspecified back pain laterality, unspecified chronicity, unspecified whether sciatica present  Muscle weakness (generalized)  Other abnormalities of gait and mobility  Abnormal posture  Other symptoms and signs involving the musculoskeletal system     Problem List Patient Active Problem List   Diagnosis Date Noted  . Atrial flutter ([place]) 09/22/2013  . Paroxysmal atrial fibrillation ([place]) 04/23/2013  . Rapid palpitations 02/26/2013  . FATIGUE 10/24/2009  . THROMBOCYTOPENIA 10/11/2009  . Chest pain 10/11/2009    Floria Raveling. Hartnett-Rands, MS, PT [facility] #[id number] 03/28/2020, 11:07 AM  [facility] [address] [place], [place], [address] Phone: [phone]   Fax:  [phone]  Name: Greg Mann MRN: [id number] Date of Birth: 1946/08/02

## 2020-03-30 ENCOUNTER — Ambulatory Visit (HOSPITAL_COMMUNITY): Payer: Medicare Other

## 2020-03-30 ENCOUNTER — Other Ambulatory Visit: Payer: Self-pay

## 2020-03-30 DIAGNOSIS — M6281 Muscle weakness (generalized): Secondary | ICD-10-CM

## 2020-03-30 DIAGNOSIS — R2689 Other abnormalities of gait and mobility: Secondary | ICD-10-CM

## 2020-03-30 DIAGNOSIS — M545 Low back pain, unspecified: Secondary | ICD-10-CM

## 2020-03-30 DIAGNOSIS — R293 Abnormal posture: Secondary | ICD-10-CM | POA: Diagnosis not present

## 2020-03-30 DIAGNOSIS — R29898 Other symptoms and signs involving the musculoskeletal system: Secondary | ICD-10-CM

## 2020-03-30 NOTE — Therapy (Signed)
[place], [place], [address] Phone: [phone]   Fax:  [phone]  Physical Therapy Treatment  Patient Details  Name: Greg Mann MRN: [id number] Date of Birth: 10-08-1945 Referring Provider (PT): Arther Abbott MD   Encounter Date: 03/30/2020   PT End of Session - 03/30/20 0900    Visit Number 6    Number of Visits 12    Date for PT Re-Evaluation 04/26/20    Authorization Type Primary: Medicare Secondary BCBS supplement (follow med guidlines, no auth)    Progress Note Due on Visit 10    PT Start Time 0838    PT Stop Time 0917    PT Time Calculation (min) 39 min    Activity Tolerance Patient tolerated treatment well    Behavior During Therapy [facility] for tasks assessed/performed           Past Medical History:  Diagnosis Date  . Borderline hyperlipidemia   . Chest pain   . Dysrhythmia   . Exercise-induced tachycardia   . Ganglion    left wrist  . GERD (gastroesophageal reflux disease)   . Thrombocytopenia ([place])    borderline    Past Surgical History:  Procedure Laterality Date  . COLONOSCOPY N/A 04/22/2018   Procedure: COLONOSCOPY;  Surgeon: Daneil Dolin, MD;  [facility];  Service: Endoscopy;  Laterality: N/A;  8:30  . GANGLION CYST EXCISION     Left Wrist  . KNEE ARTHROSCOPY     Left  . TONSILLECTOMY      There were no vitals filed for this visit.   Subjective Assessment - 03/30/20 0857    Subjective Patient has been trying to perform new HEP and thinks he is doing them correctly. The standing extensions when getting up from his desk has been helping his back pain at work.    Pertinent History Low back pain    Patient Stated Goals eliminate pain    Currently in Pain? Yes    Pain Score 2     Pain Location Back    Pain Orientation Medial;Posterior    Pain Descriptors / Indicators Aching    Pain Onset More than a month ago               [facility] Adult PT  Treatment/Exercise - 03/30/20 0001      Lumbar Exercises: Stretches   Standing Extension 10 reps    Press Ups 10 reps;5 seconds   post manual with prone and POE   Other Lumbar Stretch Exercise side-lying clock bilaterally CW/CCW for spinal rotation x6 each direction      Lumbar Exercises: Standing   Other Standing Lumbar Exercises vector stance bilateral 3" x 5 w/ HHA, esp standing on RLE      Lumbar Exercises: Prone   Single Arm Raise Weights (lbs) --   insufficient shoulder motion to perform in prone   Straight Leg Raise 10 reps;2 seconds      Lumbar Exercises: Quadruped   Single Arm Raise Right;Left;10 reps;2 seconds      Manual Therapy   Manual Therapy Joint mobilization    Manual therapy comments completed in prone at end of session    Joint Mobilization w/ PA central and unilateral joint mobs Grade III-IV   10 oscillations per segment; prone and POE            PT Education - 03/30/20 0859    Education Details Discussed technique and purpose throughout session.  Person(s) Educated Patient    Methods Explanation;Demonstration    Comprehension Verbalized understanding;Need further instruction            PT Short Term Goals - 03/30/20 0901      PT SHORT TERM GOAL #1   Title Patient will be independent with HEP in order to improve functional outcomes.    Time 3    Period Weeks    Status On-going    Target Date 04/05/20      PT SHORT TERM GOAL #2   Title Patient will report at least 25% improvement in symptoms for improved quality of life.    Baseline 03/30/20 - patient reports 10-15% improvement at this time.    Time 3    Period Weeks    Status On-going    Target Date 04/05/20             PT Long Term Goals - 03/30/20 0902      PT LONG TERM GOAL #1   Title Patient will report at least 75% improvement in symptoms for improved quality of life.    Time 6    Period Weeks    Status On-going      PT LONG TERM GOAL #2   Title Patient will be able to  ambulate for at least 30 minutes with pain no greater than 1/10 in order to demonstrate improved ability to ambulate in the community.    Time 6    Period Weeks    Status On-going      PT LONG TERM GOAL #3   Title Patient will report centralized symptoms no greater than 1/10 with all ADL in order to improve all functional mobility.    Time 6    Period Weeks    Status On-going             Plan - 03/30/20 0900    Clinical Impression Statement Patient tolerated treatment well today. Performed segmental manual joint mobilizations in prone with decreased mobility noted throughout lumbar spine. Upon initial positioning, patient reported right radicular symptom onset. This resolved prior to ending joint mobilizations in the prone position and did not return at any time during treatment session. Mobility, strength and balance exercises performed. Reviewed new HEP materials from last session with corrections as needed. Patient reported soreness but no pain at end of session and was visibly standing more erect with less forward head/rounded shoulders posture. Continue with current plan. Progress spinal mobility, core/LE strengthening and balance exercises as able.    Personal Factors and Comorbidities Age;Comorbidity 2;Time since onset of injury/illness/exacerbation;Profession;Past/Current Experience    Comorbidities LBP, A fib    Examination-Activity Limitations Bed Mobility;Locomotion Level;Stand;Lift    Examination-Participation Restrictions Cleaning;Yard Work;Volunteer;Shop    Stability/Clinical Decision Making Evolving/Moderate complexity    Rehab Potential Good    PT Frequency 2x / week    PT Duration 6 weeks    PT Treatment/Interventions ADLs/Self Care Home Management;Aquatic Therapy;Biofeedback;Cryotherapy;Electrical Stimulation;Iontophoresis 4mg /ml Dexamethasone;Moist Heat;Traction;Ultrasound;Fluidtherapy;Contrast Bath;DME Instruction;Gait training;Stair training;Functional mobility  training;Therapeutic activities;Therapeutic exercise;Balance training;Neuromuscular re-education;Patient/family education;Orthotic Fit/Training;Manual techniques;Manual lymph drainage;Compression bandaging;Passive range of motion;Dry needling;Energy conservation;Splinting;Taping;Vasopneumatic Device;Spinal Manipulations;Joint Manipulations    PT Next Visit Plan Continue to progress hip/spine mobility and postural/core strengthening. Theraband scap/core stabilization exercises.    PT Home Exercise Plan 7/7 repeated flexion in seated; 03/17/2020:  decompression, sitting tall with scapular retraction, knee to chest, double knee to chest, and quad stretch; 03/28/20 - sidelying "clock bilat for rotation, SB, and ext, McKenzie 1-3 prone exs, log roll, pillows  use for sleeping           Patient will benefit from skilled therapeutic intervention in order to improve the following deficits and impairments:  Abnormal gait, Decreased activity tolerance, Decreased mobility, Decreased endurance, Difficulty walking, Decreased range of motion, Hypomobility, Postural dysfunction, Improper body mechanics, Pain, Increased fascial restricitons, Increased muscle spasms, Impaired flexibility  Visit Diagnosis: Low back pain, unspecified back pain laterality, unspecified chronicity, unspecified whether sciatica present  Muscle weakness (generalized)  Other abnormalities of gait and mobility  Abnormal posture  Other symptoms and signs involving the musculoskeletal system     Problem List Patient Active Problem List   Diagnosis Date Noted  . Atrial flutter ([place]) 09/22/2013  . Paroxysmal atrial fibrillation ([place]) 04/23/2013  . Rapid palpitations 02/26/2013  . FATIGUE 10/24/2009  . THROMBOCYTOPENIA 10/11/2009  . Chest pain 10/11/2009    Floria Raveling. Hartnett-Rands, MS, PT Per [place] #[id number] 03/30/2020, 10:48 AM  [place], [place], [address] Phone: [phone]   Fax:  [phone]  Name: Greg Mann MRN: [id number] Date of Birth: 1946/05/07

## 2020-04-04 ENCOUNTER — Ambulatory Visit (HOSPITAL_COMMUNITY): Payer: Medicare Other | Admitting: Physical Therapy

## 2020-04-04 ENCOUNTER — Other Ambulatory Visit: Payer: Self-pay

## 2020-04-04 DIAGNOSIS — M545 Low back pain, unspecified: Secondary | ICD-10-CM

## 2020-04-04 DIAGNOSIS — R29898 Other symptoms and signs involving the musculoskeletal system: Secondary | ICD-10-CM | POA: Diagnosis not present

## 2020-04-04 DIAGNOSIS — M6281 Muscle weakness (generalized): Secondary | ICD-10-CM

## 2020-04-04 DIAGNOSIS — R293 Abnormal posture: Secondary | ICD-10-CM | POA: Diagnosis not present

## 2020-04-04 DIAGNOSIS — R2689 Other abnormalities of gait and mobility: Secondary | ICD-10-CM | POA: Diagnosis not present

## 2020-04-04 NOTE — Therapy (Signed)
[place], [place], [address] Phone: [phone]   Fax:  [phone]  Physical Therapy Treatment  Patient Details  Name: Greg Mann MRN: [id number] Date of Birth: 08-14-1946 Referring Provider (PT): Arther Abbott MD   Encounter Date: 04/04/2020   PT End of Session - 04/04/20 0911    Visit Number 7    Number of Visits 12    Date for PT Re-Evaluation 04/26/20    Authorization Type Primary: Medicare Secondary BCBS supplement (follow med guidlines, no auth)    Progress Note Due on Visit 10    PT Start Time 0830    PT Stop Time 0911    PT Time Calculation (min) 41 min    Activity Tolerance Patient tolerated treatment well    Behavior During Therapy [facility] for tasks assessed/performed           Past Medical History:  Diagnosis Date  . Borderline hyperlipidemia   . Chest pain   . Dysrhythmia   . Exercise-induced tachycardia   . Ganglion    left wrist  . GERD (gastroesophageal reflux disease)   . Thrombocytopenia ([place])    borderline    Past Surgical History:  Procedure Laterality Date  . COLONOSCOPY N/A 04/22/2018   Procedure: COLONOSCOPY;  Surgeon: Daneil Dolin, MD;  [facility];  Service: Endoscopy;  Laterality: N/A;  8:30  . GANGLION CYST EXCISION     Left Wrist  . KNEE ARTHROSCOPY     Left  . TONSILLECTOMY      There were no vitals filed for this visit.   Subjective Assessment - 04/04/20 0831    Subjective PT states that his leg pain goes in spurts; he is having some soreness in his hips and back    Pertinent History Low back pain    Limitations Walking;Standing;House hold activities    How long can you walk comfortably? 15 minutes    Patient Stated Goals eliminate pain    Currently in Pain? Yes    Pain Score 3     Pain Location Back    Pain Orientation Lower    Pain Descriptors / Indicators Sore    Pain Type Chronic pain    Pain Radiating Towards hip    Pain Onset  More than a month ago    Pain Frequency Intermittent    Aggravating Factors  not sure    Pain Relieving Factors not sure                             [facility] Adult PT Treatment/Exercise - 04/04/20 0001      Exercises   Exercises Lumbar      Lumbar Exercises: Stretches   Standing Extension --    Standing Extension Limitations 3D hip excursion    Press Ups 10 reps;5 seconds   post manual with prone and POE   Other Lumbar Stretch Exercise side-lying clock bilaterally CW/CCW for spinal rotation x6 each direction      Lumbar Exercises: Standing   Scapular Retraction Strengthening;Both;10 reps;Theraband    Theraband Level (Scapular Retraction) Level 3 (Green)    Row Strengthening;Both;10 reps    Shoulder Extension 10 reps;Theraband    Theraband Level (Shoulder Extension) Level 3 (Green)    Other Standing Lumbar Exercises vector stance bilateral 3" x 5 w/ HHA, esp standing on RLE    Other Standing Lumbar Exercises wall arch x 10/ wall  lift off x 10       Lumbar Exercises: Seated   Sit to Stand 10 reps      Lumbar Exercises: Sidelying   Other Sidelying Lumbar Exercises clock stretch       Lumbar Exercises: Prone   Single Arm Raise Weights (lbs) --    Straight Leg Raise --      Lumbar Exercises: Quadruped   Single Arm Raise --      Manual Therapy   Manual Therapy --    Manual therapy comments --    Joint Mobilization --                    PT Short Term Goals - 03/30/20 0901      PT SHORT TERM GOAL #1   Title Patient will be independent with HEP in order to improve functional outcomes.    Time 3    Period Weeks    Status On-going    Target Date 04/05/20      PT SHORT TERM GOAL #2   Title Patient will report at least 25% improvement in symptoms for improved quality of life.    Baseline 03/30/20 - patient reports 10-15% improvement at this time.    Time 3    Period Weeks    Status On-going    Target Date 04/05/20             PT Long  Term Goals - 03/30/20 0902      PT LONG TERM GOAL #1   Title Patient will report at least 75% improvement in symptoms for improved quality of life.    Time 6    Period Weeks    Status On-going      PT LONG TERM GOAL #2   Title Patient will be able to ambulate for at least 30 minutes with pain no greater than 1/10 in order to demonstrate improved ability to ambulate in the community.    Time 6    Period Weeks    Status On-going      PT LONG TERM GOAL #3   Title Patient will report centralized symptoms no greater than 1/10 with all ADL in order to improve all functional mobility.    Time 6    Period Weeks    Status On-going                 Plan - 04/04/20 0911    Clinical Impression Statement Added T band exercises for postrual strengthening, sit to stand for proper technique and wall arch for stretchnig.  Pt completes exercises with min assist needed for proper technique.  Improved radicular sx, pt will continue to benefit from skilled PT    Personal Factors and Comorbidities Age;Comorbidity 2;Time since onset of injury/illness/exacerbation;Profession;Past/Current Experience    Comorbidities LBP, A fib    Examination-Activity Limitations Bed Mobility;Locomotion Level;Stand;Lift    Examination-Participation Restrictions Cleaning;Yard Work;Volunteer;Shop    Stability/Clinical Decision Making Evolving/Moderate complexity    Rehab Potential Good    PT Frequency 2x / week    PT Duration 6 weeks    PT Treatment/Interventions ADLs/Self Care Home Management;Aquatic Therapy;Biofeedback;Cryotherapy;Electrical Stimulation;Iontophoresis 4mg /ml Dexamethasone;Moist Heat;Traction;Ultrasound;Fluidtherapy;Contrast Bath;DME Instruction;Gait training;Stair training;Functional mobility training;Therapeutic activities;Therapeutic exercise;Balance training;Neuromuscular re-education;Patient/family education;Orthotic Fit/Training;Manual techniques;Manual lymph drainage;Compression bandaging;Passive  range of motion;Dry needling;Energy conservation;Splinting;Taping;Vasopneumatic Device;Spinal Manipulations;Joint Manipulations    PT Next Visit Plan Continue to progress hip/spine mobility and postural/core strengthening.    PT Home Exercise Plan 7/7 repeated flexion in seated; 03/17/2020:  decompression, sitting tall  with scapular retraction, knee to chest, double knee to chest, and quad stretch; 03/28/20 - sidelying "clock bilat for rotation, SB, and ext, McKenzie 1-3 prone exs, log roll, pillows use for sleeping           Patient will benefit from skilled therapeutic intervention in order to improve the following deficits and impairments:  Abnormal gait, Decreased activity tolerance, Decreased mobility, Decreased endurance, Difficulty walking, Decreased range of motion, Hypomobility, Postural dysfunction, Improper body mechanics, Pain, Increased fascial restricitons, Increased muscle spasms, Impaired flexibility  Visit Diagnosis: Low back pain, unspecified back pain laterality, unspecified chronicity, unspecified whether sciatica present  Muscle weakness (generalized)  Other abnormalities of gait and mobility  Abnormal posture     Problem List Patient Active Problem List   Diagnosis Date Noted  . Atrial flutter ([place]) 09/22/2013  . Paroxysmal atrial fibrillation ([place]) 04/23/2013  . Rapid palpitations 02/26/2013  . FATIGUE 10/24/2009  . THROMBOCYTOPENIA 10/11/2009  . Chest pain 10/11/2009    Rayetta Humphrey, PT CLT [id number] 04/04/2020, 9:16 AM  [place] [address] [place], [place], [address] Phone: [phone]   Fax:  [phone]  Name: Greg Mann MRN: [id number] Date of Birth: 11-12-1945

## 2020-04-06 ENCOUNTER — Ambulatory Visit (HOSPITAL_COMMUNITY): Payer: Medicare Other | Admitting: Physical Therapy

## 2020-04-06 ENCOUNTER — Other Ambulatory Visit: Payer: Self-pay

## 2020-04-06 DIAGNOSIS — R2689 Other abnormalities of gait and mobility: Secondary | ICD-10-CM | POA: Diagnosis not present

## 2020-04-06 DIAGNOSIS — R29898 Other symptoms and signs involving the musculoskeletal system: Secondary | ICD-10-CM

## 2020-04-06 DIAGNOSIS — R293 Abnormal posture: Secondary | ICD-10-CM

## 2020-04-06 DIAGNOSIS — M545 Low back pain, unspecified: Secondary | ICD-10-CM

## 2020-04-06 DIAGNOSIS — M6281 Muscle weakness (generalized): Secondary | ICD-10-CM | POA: Diagnosis not present

## 2020-04-06 NOTE — Therapy (Signed)
[place], [place], [address] Phone: [phone]   Fax:  [phone]  Physical Therapy Treatment  Patient Details  Name: Greg Mann MRN: [id number] Date of Birth: 1945-11-03 Referring Provider (PT): Arther Abbott MD   Encounter Date: 04/06/2020   PT End of Session - 04/06/20 0849    Visit Number 8    Number of Visits 12    Date for PT Re-Evaluation 04/26/20    Authorization Type Primary: Medicare Secondary BCBS supplement (follow med guidlines, no auth)    Progress Note Due on Visit 10    PT Start Time 0830    PT Stop Time 0910    PT Time Calculation (min) 40 min    Activity Tolerance Patient tolerated treatment well    Behavior During Therapy [facility] for tasks assessed/performed           Past Medical History:  Diagnosis Date  . Borderline hyperlipidemia   . Chest pain   . Dysrhythmia   . Exercise-induced tachycardia   . Ganglion    left wrist  . GERD (gastroesophageal reflux disease)   . Thrombocytopenia ([place])    borderline    Past Surgical History:  Procedure Laterality Date  . COLONOSCOPY N/A 04/22/2018   Procedure: COLONOSCOPY;  Surgeon: Daneil Dolin, MD;  [facility];  Service: Endoscopy;  Laterality: N/A;  8:30  . GANGLION CYST EXCISION     Left Wrist  . KNEE ARTHROSCOPY     Left  . TONSILLECTOMY      There were no vitals filed for this visit.   Subjective Assessment - 04/06/20 0843    Subjective Pt states he had pain when he woke up but did his exercises and is feeling better now.    Pertinent History Low back pain    Limitations Walking;Standing;House hold activities    How long can you walk comfortably? 15 minutes    Patient Stated Goals eliminate pain    Currently in Pain? No/denies    Pain Score 0-No pain    Pain Onset More than a month ago                             [facility] Adult PT Treatment/Exercise - 04/06/20 0001      Exercises    Exercises Lumbar      Lumbar Exercises: Stretches   Active Hamstring Stretch Right;Left;3 reps;30 seconds      Lumbar Exercises: Standing   Scapular Retraction Strengthening;Both;10 reps;Theraband    Theraband Level (Scapular Retraction) Level 3 (Green)    Row Strengthening;Both;10 reps    Theraband Level (Row) Level 3 (Green)    Shoulder Extension 10 reps;Theraband    Theraband Level (Shoulder Extension) Level 3 (Green)    Other Standing Lumbar Exercises vector stance bilateral 3 x 5" w/ HHA, esp standing on RLE    Other Standing Lumbar Exercises wall arch x 10/ wall lift off x 10       Lumbar Exercises: Supine   Other Supine Lumbar Exercises decompression wiht greeen 5x 3" holds x 4      Lumbar Exercises: Prone   Opposite Arm/Leg Raise Right arm/Left leg;Left arm/Right leg;5 reps                    PT Short Term Goals - 03/30/20 0901      PT SHORT TERM GOAL #1   Title Patient  will be independent with HEP in order to improve functional outcomes.    Time 3    Period Weeks    Status On-going    Target Date 04/05/20      PT SHORT TERM GOAL #2   Title Patient will report at least 25% improvement in symptoms for improved quality of life.    Baseline 03/30/20 - patient reports 10-15% improvement at this time.    Time 3    Period Weeks    Status On-going    Target Date 04/05/20             PT Long Term Goals - 03/30/20 0902      PT LONG TERM GOAL #1   Title Patient will report at least 75% improvement in symptoms for improved quality of life.    Time 6    Period Weeks    Status On-going      PT LONG TERM GOAL #2   Title Patient will be able to ambulate for at least 30 minutes with pain no greater than 1/10 in order to demonstrate improved ability to ambulate in the community.    Time 6    Period Weeks    Status On-going      PT LONG TERM GOAL #3   Title Patient will report centralized symptoms no greater than 1/10 with all ADL in order to improve all  functional mobility.    Time 6    Period Weeks    Status On-going                 Plan - 04/06/20 4132    Clinical Impression Statement Reviewed and gave postural tband exercise for HEP.  Pt will still need to continue in department to ensure proper form.  Pt given tband and tband decompression sheet to improve posture as well.    Personal Factors and Comorbidities Age;Comorbidity 2;Time since onset of injury/illness/exacerbation;Profession;Past/Current Experience    Comorbidities LBP, A fib    Examination-Activity Limitations Bed Mobility;Locomotion Level;Stand;Lift    Examination-Participation Restrictions Cleaning;Yard Work;Volunteer;Shop    Stability/Clinical Decision Making Evolving/Moderate complexity    Rehab Potential Good    PT Frequency 2x / week    PT Duration 6 weeks    PT Treatment/Interventions ADLs/Self Care Home Management;Aquatic Therapy;Biofeedback;Cryotherapy;Electrical Stimulation;Iontophoresis 4mg /ml Dexamethasone;Moist Heat;Traction;Ultrasound;Fluidtherapy;Contrast Bath;DME Instruction;Gait training;Stair training;Functional mobility training;Therapeutic activities;Therapeutic exercise;Balance training;Neuromuscular re-education;Patient/family education;Orthotic Fit/Training;Manual techniques;Manual lymph drainage;Compression bandaging;Passive range of motion;Dry needling;Energy conservation;Splinting;Taping;Vasopneumatic Device;Spinal Manipulations;Joint Manipulations    PT Next Visit Plan Continue to progress hip/spine mobility and postural/core strengthening.    PT Home Exercise Plan 7/7 repeated flexion in seated; 03/17/2020:  decompression, sitting tall with scapular retraction, knee to chest, double knee to chest, and quad stretch; 03/28/20 - sidelying "clock bilat for rotation, SB, and ext, McKenzie 1-3 prone exs, log roll, pillows use for sleeping7/29: tband postural and decompression exercises.           Patient will benefit from skilled therapeutic  intervention in order to improve the following deficits and impairments:  Abnormal gait, Decreased activity tolerance, Decreased mobility, Decreased endurance, Difficulty walking, Decreased range of motion, Hypomobility, Postural dysfunction, Improper body mechanics, Pain, Increased fascial restricitons, Increased muscle spasms, Impaired flexibility  Visit Diagnosis: Low back pain, unspecified back pain laterality, unspecified chronicity, unspecified whether sciatica present  Muscle weakness (generalized)  Other abnormalities of gait and mobility  Abnormal posture  Other symptoms and signs involving the musculoskeletal system     Problem List Patient Active Problem List  Diagnosis Date Noted  . Atrial flutter ([place]) 09/22/2013  . Paroxysmal atrial fibrillation ([place]) 04/23/2013  . Rapid palpitations 02/26/2013  . FATIGUE 10/24/2009  . THROMBOCYTOPENIA 10/11/2009  . Chest pain 10/11/2009   Rayetta Humphrey, PT CLT [phone] 04/06/2020, 9:21 AM  [place] [address] [place], [place], [address] Phone: [phone]   Fax:  [phone]  Name: Greg Mann MRN: [id number] Date of Birth: 11-29-1945

## 2020-04-11 ENCOUNTER — Encounter (HOSPITAL_COMMUNITY): Payer: Self-pay

## 2020-04-11 ENCOUNTER — Ambulatory Visit (HOSPITAL_COMMUNITY): Payer: Medicare Other | Attending: Orthopedic Surgery

## 2020-04-11 ENCOUNTER — Other Ambulatory Visit: Payer: Self-pay

## 2020-04-11 DIAGNOSIS — R29898 Other symptoms and signs involving the musculoskeletal system: Secondary | ICD-10-CM | POA: Diagnosis not present

## 2020-04-11 DIAGNOSIS — R2689 Other abnormalities of gait and mobility: Secondary | ICD-10-CM | POA: Diagnosis not present

## 2020-04-11 DIAGNOSIS — M545 Low back pain, unspecified: Secondary | ICD-10-CM

## 2020-04-11 DIAGNOSIS — M6281 Muscle weakness (generalized): Secondary | ICD-10-CM | POA: Diagnosis not present

## 2020-04-11 DIAGNOSIS — R293 Abnormal posture: Secondary | ICD-10-CM | POA: Diagnosis not present

## 2020-04-11 NOTE — Therapy (Signed)
[place], [place], [address] Phone: [phone]   Fax:  [phone]  Physical Therapy Treatment  Patient Details  Name: Greg Mann MRN: [id number] Date of Birth: 1945-11-25 Referring Provider (PT): Arther Abbott MD   Encounter Date: 04/11/2020   PT End of Session - 04/11/20 0839    Visit Number 9    Number of Visits 12    Date for PT Re-Evaluation 04/26/20    Authorization Type Primary: Medicare Secondary BCBS supplement (follow med guidlines, no auth)    Progress Note Due on Visit 10    PT Start Time 0834    PT Stop Time 0912    PT Time Calculation (min) 38 min    Activity Tolerance Patient tolerated treatment well    Behavior During Therapy [facility] for tasks assessed/performed           Past Medical History:  Diagnosis Date  . Borderline hyperlipidemia   . Chest pain   . Dysrhythmia   . Exercise-induced tachycardia   . Ganglion    left wrist  . GERD (gastroesophageal reflux disease)   . Thrombocytopenia ([place])    borderline    Past Surgical History:  Procedure Laterality Date  . COLONOSCOPY N/A 04/22/2018   Procedure: COLONOSCOPY;  Surgeon: Daneil Dolin, MD;  [facility];  Service: Endoscopy;  Laterality: N/A;  8:30  . GANGLION CYST EXCISION     Left Wrist  . KNEE ARTHROSCOPY     Left  . TONSILLECTOMY      There were no vitals filed for this visit.   Subjective Assessment - 04/11/20 0838    Subjective Pt reports he had some pain and stiffness this morning, reports he did his exercises and feels better upon entrance to therapy.  Pt brought theraband with him for session..    Pertinent History Low back pain    Patient Stated Goals eliminate pain    Currently in Pain? No/denies                             [facility] Adult PT Treatment/Exercise - 04/11/20 0001      Posture/Postural Control   Posture/Postural Control Postural limitations    Postural  Limitations Rounded Shoulders;Forward head;Decreased lumbar lordosis;Increased thoracic kyphosis      Exercises   Exercises Lumbar      Lumbar Exercises: Standing   Heel Raises 10 reps    Heel Raises Limitations paired with wall arch, 3" lift off    Functional Squats 10 reps    Functional Squats Limitations standing infront of mat, cueing for mechanics    Scapular Retraction Strengthening;15 reps;Theraband    Theraband Level (Scapular Retraction) Level 3 (Green)    Scapular Retraction Limitations reviewed form with HEP    Row Strengthening;15 reps;Theraband    Theraband Level (Row) Level 3 (Green)    Row Limitations reviewed form with HEP    Shoulder Extension Strengthening;15 reps;Theraband    Theraband Level (Shoulder Extension) Level 3 (Green)    Shoulder Extension Limitations reviewed form with HEP    Other Standing Lumbar Exercises vector stance bilateral 5 x 5" w/ HHA, esp standing on RLE    Other Standing Lumbar Exercises paloff GTB 4x 10      Lumbar Exercises: Prone   Opposite Arm/Leg Raise 10 reps;Right arm/Left leg;Left arm/Right leg;3 seconds    Other Prone Lumbar Exercises fire hydrant  PT Short Term Goals - 03/30/20 0901      PT SHORT TERM GOAL #1   Title Patient will be independent with HEP in order to improve functional outcomes.    Time 3    Period Weeks    Status On-going    Target Date 04/05/20      PT SHORT TERM GOAL #2   Title Patient will report at least 25% improvement in symptoms for improved quality of life.    Baseline 03/30/20 - patient reports 10-15% improvement at this time.    Time 3    Period Weeks    Status On-going    Target Date 04/05/20             PT Long Term Goals - 03/30/20 0902      PT LONG TERM GOAL #1   Title Patient will report at least 75% improvement in symptoms for improved quality of life.    Time 6    Period Weeks    Status On-going      PT LONG TERM GOAL #2   Title Patient will be  able to ambulate for at least 30 minutes with pain no greater than 1/10 in order to demonstrate improved ability to ambulate in the community.    Time 6    Period Weeks    Status On-going      PT LONG TERM GOAL #3   Title Patient will report centralized symptoms no greater than 1/10 with all ADL in order to improve all functional mobility.    Time 6    Period Weeks    Status On-going                 Plan - 04/11/20 0947    Clinical Impression Statement Session focus with postural and core strengthening.  Reviewed form for proper mechanics with theraband with cueing required, continue in dept to ensure proper form.  Added gluteal and core strengthening exercises with cueing for posture awarness and core activaction to improve stability with task.  No reports of pain through session.    Personal Factors and Comorbidities Age;Comorbidity 2;Time since onset of injury/illness/exacerbation;Profession;Past/Current Experience    Comorbidities LBP, A fib    Examination-Activity Limitations Bed Mobility;Locomotion Level;Stand;Lift    Examination-Participation Restrictions Cleaning;Yard Work;Volunteer;Shop    Stability/Clinical Decision Making Evolving/Moderate complexity    Clinical Decision Making Moderate    Rehab Potential Good    PT Frequency 2x / week    PT Duration 6 weeks    PT Treatment/Interventions ADLs/Self Care Home Management;Aquatic Therapy;Biofeedback;Cryotherapy;Electrical Stimulation;Iontophoresis 4mg /ml Dexamethasone;Moist Heat;Traction;Ultrasound;Fluidtherapy;Contrast Bath;DME Instruction;Gait training;Stair training;Functional mobility training;Therapeutic activities;Therapeutic exercise;Balance training;Neuromuscular re-education;Patient/family education;Orthotic Fit/Training;Manual techniques;Manual lymph drainage;Compression bandaging;Passive range of motion;Dry needling;Energy conservation;Splinting;Taping;Vasopneumatic Device;Spinal Manipulations;Joint Manipulations     PT Next Visit Plan Continue to progress hip/spine mobility and postural/core strengthening.    PT Home Exercise Plan 7/7 repeated flexion in seated; 03/17/2020:  decompression, sitting tall with scapular retraction, knee to chest, double knee to chest, and quad stretch; 03/28/20 - sidelying "clock bilat for rotation, SB, and ext, McKenzie 1-3 prone exs, log roll, pillows use for sleeping7/29: tband postural and decompression exercises.           Patient will benefit from skilled therapeutic intervention in order to improve the following deficits and impairments:  Abnormal gait, Decreased activity tolerance, Decreased mobility, Decreased endurance, Difficulty walking, Decreased range of motion, Hypomobility, Postural dysfunction, Improper body mechanics, Pain, Increased fascial restricitons, Increased muscle spasms, Impaired flexibility  Visit Diagnosis: Low back  pain, unspecified back pain laterality, unspecified chronicity, unspecified whether sciatica present  Muscle weakness (generalized)  Other abnormalities of gait and mobility  Abnormal posture  Other symptoms and signs involving the musculoskeletal system     Problem List Patient Active Problem List   Diagnosis Date Noted  . Atrial flutter ([place]) 09/22/2013  . Paroxysmal atrial fibrillation ([place]) 04/23/2013  . Rapid palpitations 02/26/2013  . FATIGUE 10/24/2009  . THROMBOCYTOPENIA 10/11/2009  . Chest pain 10/11/2009   Ihor Austin, LPTA/CLT; CBIS [phone]  Aldona Lento 04/11/2020, 9:17 AM  [facility] [address] [place], [place], [address] Phone: [phone]   Fax:  [phone]  Name: Greg Mann MRN: [id number] Date of Birth: 11/08/1945

## 2020-04-13 ENCOUNTER — Other Ambulatory Visit: Payer: Self-pay

## 2020-04-13 ENCOUNTER — Ambulatory Visit (HOSPITAL_COMMUNITY): Payer: Medicare Other

## 2020-04-13 ENCOUNTER — Encounter (HOSPITAL_COMMUNITY): Payer: Self-pay

## 2020-04-13 DIAGNOSIS — M545 Low back pain, unspecified: Secondary | ICD-10-CM

## 2020-04-13 DIAGNOSIS — M6281 Muscle weakness (generalized): Secondary | ICD-10-CM

## 2020-04-13 DIAGNOSIS — R29898 Other symptoms and signs involving the musculoskeletal system: Secondary | ICD-10-CM

## 2020-04-13 DIAGNOSIS — R293 Abnormal posture: Secondary | ICD-10-CM | POA: Diagnosis not present

## 2020-04-13 DIAGNOSIS — R2689 Other abnormalities of gait and mobility: Secondary | ICD-10-CM | POA: Diagnosis not present

## 2020-04-13 NOTE — Patient Instructions (Signed)
On Elbows (Prone)    Rise up on elbows as high as possible, keeping hips on floor. Hold 2 minutes. Repeat 2 times per set.  http://orth.exer.us/92   Copyright  VHI. All rights reserved.   Scapular Retraction: Elbow Flexion (Standing)    With elbows bent to 90, pinch shoulder blades together and rotate arms out, keeping elbows bent. Repeat 10 times per set. Do 2 sets per session.  http://orth.exer.us/948   Copyright  VHI. All rights reserved.

## 2020-04-13 NOTE — Therapy (Addendum)
[place], [place], [address] Phone: [phone]   Fax:  [phone]  Physical Therapy Treatment/Progress Note  Patient Details  Name: Greg Mann MRN: [id number] Date of Birth: [birth year] Referring Provider (PT): Arther Abbott MD   Encounter Date: 04/13/2020   Progress Note   Reporting Period 03/15/20 to 04/13/20   See note below for Objective Data and Assessment of Progress/Goals    PT End of Session - 04/13/20 0910    Visit Number 10    Number of Visits 12    Date for PT Re-Evaluation 04/26/20    Authorization Type Primary: Medicare Secondary BCBS supplement (follow med guidlines, no auth)    Progress Note Due on Visit 20    PT Start Time 0834    PT Stop Time 0912    PT Time Calculation (min) 38 min    Activity Tolerance Patient tolerated treatment well    Behavior During Therapy [facility] for tasks assessed/performed           Past Medical History:  Diagnosis Date  . Borderline hyperlipidemia   . Chest pain   . Dysrhythmia   . Exercise-induced tachycardia   . Ganglion    left wrist  . GERD (gastroesophageal reflux disease)   . Thrombocytopenia ([place])    borderline    Past Surgical History:  Procedure Laterality Date  . COLONOSCOPY N/A 04/22/2018   Procedure: COLONOSCOPY;  Surgeon: Daneil Dolin, MD;  [facility];  Service: Endoscopy;  Laterality: N/A;  8:30  . GANGLION CYST EXCISION     Left Wrist  . KNEE ARTHROSCOPY     Left  . TONSILLECTOMY      There were no vitals filed for this visit.   Subjective Assessment - 04/13/20 0837    Subjective Pt reports he woke up with no pain this morning, surprised himself.  Continues to be compliant with HEP daily.  Plans to leave for [place] next Monday for 2 weeks to watch grandkids.    Pertinent History Low back pain    How long can you sit comfortably? unlimited    How long can you stand comfortably? Able to stand comfortably for  various amounts of time, does c/o increased Lt thigh pain following walking for 10-15 minutes.    How long can you walk comfortably? 15 minutes, does report symptoms have reduced    Patient Stated Goals eliminate pain    Currently in Pain? No/denies              [facility] PT Assessment - 04/13/20 0001      Assessment   Medical Diagnosis Neuropathy of LLE/ Lumbar    Referring Provider (PT) Arther Abbott MD    Onset Date/Surgical Date 11/14/19    Next MD Visit January 2022    Prior Therapy for knee surgery      Observation/Other Assessments   Focus on Therapeutic Outcomes (FOTO)  not completed      AROM   Lumbar Flexion 10% limited no pain just a stretch in posterior LE   was 25%   Lumbar Extension 50% limited feels stretch in lower back   was 75%   Lumbar - Right Side Bend 25% limited feels stretch in back   was 50%   Lumbar - Left Side Bend 25% limited feels stretch in back   was 50%   Lumbar - Right Rotation 15% limited feels stretch in lower back no pain   was  25%   Lumbar - Left Rotation 15% limited feels stretch in lower back no pain   was 25%     Strength   Strength Assessment Site Hip;Knee;Ankle    Right Hip Flexion 4+/5   was 4/5   Right Hip Extension 4/5    Right Hip ABduction 4+/5    Left Hip Flexion 4+/5   was 4/5   Left Hip Extension 4/5    Left Hip ABduction 4+/5    Right Knee Flexion 4+/5   was 4+   Left Knee Flexion 4+/5      Ambulation/Gait   Ambulation Distance (Feet) 452 Feet   was 425   Assistive device None    Gait Pattern Trunk flexed    Ambulation Surface Level;Indoor    Gait Comments 2 MWT                         [facility] Adult PT Treatment/Exercise - 04/13/20 0001      Posture/Postural Control   Posture/Postural Control Postural limitations    Postural Limitations Rounded Shoulders;Forward head;Decreased lumbar lordosis;Increased thoracic kyphosis      Exercises   Exercises Lumbar      Lumbar Exercises: Stretches   Prone on  Elbows Stretch Limitations 2 minutes    Press Ups --      Lumbar Exercises: Standing   Heel Raises 15 reps    Heel Raises Limitations paired with wall arch, 3" lift off                    PT Short Term Goals - 04/13/20 0841      PT SHORT TERM GOAL #1   Title Patient will be independent with HEP in order to improve functional outcomes.    Baseline 04/13/20:  Reports compliance wiht HEP daily with advanced exercises    Status Achieved      PT SHORT TERM GOAL #2   Title Patient will report at least 25% improvement in symptoms for improved quality of life.    Baseline 04/13/20:  Reports improvements by 50%.  03/30/20 - patient reports 10-15% improvement at this time.    Status Achieved             PT Long Term Goals - 04/13/20 0843      PT LONG TERM GOAL #1   Title Patient will report at least 75% improvement in symptoms for improved quality of life.    Baseline 04/13/20:  Reports improvements by 50%.  03/30/20 - patient reports 10-15% improvement at this time.    Status On-going      PT LONG TERM GOAL #2   Title Patient will be able to ambulate for at least 30 minutes with pain no greater than 1/10 in order to demonstrate improved ability to ambulate in the community.    Baseline 04/13/20:  Reports ability to walk for 15 minutes wiht reports of burning in Lt thigh pain scale 3-4/10.    Status On-going      PT LONG TERM GOAL #3   Title Patient will report centralized symptoms no greater than 1/10 with all ADL in order to improve all functional mobility.    Baseline 04/13/20:  Reports burning sensation to anterior Lt thigh ending at knee, reports no reduction.    Status On-going                 Plan - 04/13/20 0941    Clinical Impression Statement Reviewed  goals per 10th visit.  Pt is progressing well with reports of improvements by 50% subjectively.  Reports reduction in radicular Lt thigh burning frequency though does continues to have the same anterior thigh burning  sensation.  Upon findings pt continues to have lumbar mobility limitations and strength deficits.  Pt continues to present wiht forward trunk flexion stance and during gait, improvements noted following cueing to improve awareness of posture.  Pt plans to watch grandchildren in [place] for the next 2 weeks, would like to return to therapy upon return.  Reviewed importance of HEP complaince wiht additional exercises added to address postural deficits.    Personal Factors and Comorbidities Age;Comorbidity 2;Time since onset of injury/illness/exacerbation;Profession;Past/Current Experience    Comorbidities LBP, A fib    Examination-Activity Limitations Bed Mobility;Locomotion Level;Stand;Lift    Examination-Participation Restrictions Cleaning;Yard Work;Volunteer;Shop    Stability/Clinical Decision Making Evolving/Moderate complexity    Clinical Decision Making Moderate    Rehab Potential Good    PT Frequency 2x / week    PT Duration 6 weeks    PT Treatment/Interventions ADLs/Self Care Home Management;Aquatic Therapy;Biofeedback;Cryotherapy;Electrical Stimulation;Iontophoresis 58m/ml Dexamethasone;Moist Heat;Traction;Ultrasound;Fluidtherapy;Contrast Bath;DME Instruction;Gait training;Stair training;Functional mobility training;Therapeutic activities;Therapeutic exercise;Balance training;Neuromuscular re-education;Patient/family education;Orthotic Fit/Training;Manual techniques;Manual lymph drainage;Compression bandaging;Passive range of motion;Dry needling;Energy conservation;Splinting;Taping;Vasopneumatic Device;Spinal Manipulations;Joint Manipulations    PT Next Visit Plan Pt out of town for 2 weeks.  Continue to progress hip/spine mobility and postural/core strengthening.    PT Home Exercise Plan 7/7 repeated flexion in seated; 03/17/2020:  decompression, sitting tall with scapular retraction, knee to chest, double knee to chest, and quad stretch; 03/28/20 - sidelying "clock bilat for rotation, SB, and ext,  Mc[place]enzie 1-3 prone exs, log roll, pillows use for sleeping7/29: tband postural and decompression exercises.; 04/13/20: wback and prone/POE           Patient will benefit from skilled therapeutic intervention in order to improve the following deficits and impairments:  Abnormal gait, Decreased activity tolerance, Decreased mobility, Decreased endurance, Difficulty walking, Decreased range of motion, Hypomobility, Postural dysfunction, Improper body mechanics, Pain, Increased fascial restricitons, Increased muscle spasms, Impaired flexibility  Visit Diagnosis: Low back pain, unspecified back pain laterality, unspecified chronicity, unspecified whether sciatica present  Muscle weakness (generalized)  Other abnormalities of gait and mobility  Abnormal posture  Other symptoms and signs involving the musculoskeletal system     Problem List Patient Active Problem List   Diagnosis Date Noted  . Atrial flutter ([place] 09/22/2013  . Paroxysmal [place] ([place] 04/23/2013  . Rapid palpitations 02/26/2013  . FATIGUE 10/24/2009  . THROMBOCYTOPENIA 10/11/2009  . Chest pain 10/11/2009   Assessment I have read and agree with the above information. Patient has met 2/2 short term goals with ability to complete HEP and improvement in symptoms. Patient has met 0/3 long term goals due to continued symptoms with gait and ADL limiting function. Patient has made good progress toward remaining goals. Patient has shown improvement in ROM and strength but continues to be limited by pain. Patient will continue to benefit from skilled physical therapy in order to reduce impairment and improve function. 2:19 PM, 04/14/20 AMearl LatinPT, DPT Physical Therapist at [facility] LPTA/CLT; CBIS [phone] CAldona Lento8/01/2020, 1:08 PM  [place] [address]Phone:  [phone]  Fax:  [phone] Name: Greg FONTANEZMRN: [id number] of Birth: 621-Mar-1947

## 2020-04-18 ENCOUNTER — Encounter (HOSPITAL_COMMUNITY): Payer: Medicare Other

## 2020-04-20 ENCOUNTER — Encounter (HOSPITAL_COMMUNITY): Payer: Medicare Other

## 2020-04-26 ENCOUNTER — Encounter (HOSPITAL_COMMUNITY): Payer: Medicare Other | Admitting: Physical Therapy

## 2020-04-28 ENCOUNTER — Telehealth (HOSPITAL_COMMUNITY): Payer: Self-pay | Admitting: Physical Therapy

## 2020-04-28 ENCOUNTER — Encounter (HOSPITAL_COMMUNITY): Payer: Medicare Other | Admitting: Physical Therapy

## 2020-04-28 NOTE — Telephone Encounter (Signed)
pt cancelled appt on 8/24 because his wife has surgery this day

## 2020-05-02 ENCOUNTER — Encounter (HOSPITAL_COMMUNITY): Payer: Medicare Other | Admitting: Physical Therapy

## 2020-05-04 ENCOUNTER — Other Ambulatory Visit: Payer: Self-pay

## 2020-05-04 ENCOUNTER — Ambulatory Visit (HOSPITAL_COMMUNITY): Payer: Medicare Other | Admitting: Physical Therapy

## 2020-05-04 ENCOUNTER — Encounter (HOSPITAL_COMMUNITY): Payer: Self-pay | Admitting: Physical Therapy

## 2020-05-04 DIAGNOSIS — R29898 Other symptoms and signs involving the musculoskeletal system: Secondary | ICD-10-CM | POA: Diagnosis not present

## 2020-05-04 DIAGNOSIS — M6281 Muscle weakness (generalized): Secondary | ICD-10-CM

## 2020-05-04 DIAGNOSIS — R293 Abnormal posture: Secondary | ICD-10-CM

## 2020-05-04 DIAGNOSIS — R2689 Other abnormalities of gait and mobility: Secondary | ICD-10-CM | POA: Diagnosis not present

## 2020-05-04 DIAGNOSIS — M545 Low back pain, unspecified: Secondary | ICD-10-CM

## 2020-05-04 NOTE — Therapy (Signed)
[place], [place], [address] Phone: [phone]   Fax:  [phone]  Physical Therapy Treatment and RECERT  Patient Details  Name: Greg Mann MRN: [id number] Date of Birth: [birth year] Referring Provider (PT): Arther Abbott MD   Encounter Date: 05/04/2020   PT End of Session - 05/04/20 0829    Visit Number 11    Number of Visits 18    Date for PT Re-Evaluation 05/25/20    Authorization Type Primary: Medicare Secondary BCBS supplement (follow med guidlines, no auth)    Progress Note Due on Visit 20    PT Start Time 0830    PT Stop Time 0913    PT Time Calculation (min) 43 min    Activity Tolerance Patient tolerated treatment well    Behavior During Therapy [facility] for tasks assessed/performed           Past Medical History:  Diagnosis Date  . Borderline hyperlipidemia   . Chest pain   . Dysrhythmia   . Exercise-induced tachycardia   . Ganglion    left wrist  . GERD (gastroesophageal reflux disease)   . Thrombocytopenia ([place])    borderline    Past Surgical History:  Procedure Laterality Date  . COLONOSCOPY N/A 04/22/2018   Procedure: COLONOSCOPY;  Surgeon: Daneil Dolin, MD;  [facility];  Service: Endoscopy;  Laterality: N/A;  8:30  . GANGLION CYST EXCISION     Left Wrist  . KNEE ARTHROSCOPY     Left  . TONSILLECTOMY      There were no vitals filed for this visit.   Subjective Assessment - 05/04/20 0842    Subjective 75-85% better since the start of  PT. States he still has discomfort in his lower back. States the pain in his leg still happens but not as frequent as they used. States that bending forward and standing up tall feels good. States sits at work a lot and does not have back support.    Pertinent History Low back pain    How long can you sit comfortably? unlimited    How long can you stand comfortably? Able to stand comfortably for various amounts of time, does c/o  increased Lt thigh pain following walking for 10-15 minutes.    How long can you walk comfortably? 15 minutes, does report symptoms have reduced    Patient Stated Goals eliminate pain    Currently in Pain? Yes    Pain Score 2     Pain Location Back    Pain Orientation Lower    Pain Descriptors / Indicators Sore    Pain Type Chronic pain              [facility] PT Assessment - 05/04/20 0001      Assessment   Medical Diagnosis Neuropathy of LLE/ Lumbar    Referring Provider (PT) Arther Abbott MD    Onset Date/Surgical Date 11/14/19    Next MD Visit January 2022      AROM   Lumbar Flexion 50% limited - pulling     Lumbar Extension 75% limited - pulling    Lumbar - Right Side Bend 25% limited pulling    Lumbar - Left Side Bend 50% limited slight discomfort    Lumbar - Right Rotation 25% limited pulling    Lumbar - Left Rotation 25% limited pulling.  [place] Adult PT Treatment/Exercise - 05/04/20 0001      Lumbar Exercises: Stretches   Single Knee to Chest Stretch 3 reps;Right;Left;30 seconds    Double Knee to Chest Stretch 3 reps;30 seconds    Quad Stretch Left;Right;3 reps;60 seconds   thomas stretch     Lumbar Exercises: Standing   Other Standing Lumbar Exercises lumbar extension at wall x15 5"holds      Lumbar Exercises: Seated   Other Seated Lumbar Exercises lumbar flexion x15 5" holds      Lumbar Exercises: Sidelying   Other Sidelying Lumbar Exercises book stretch x15 5" holds B       Lumbar Exercises: Prone   Other Prone Lumbar Exercises POE x10 5"holds                  PT Education - 05/04/20 0858    Education Details educated patient in use of lumbar support in seated position to reduce strain on back while sitting at work. updated/reviewed HEP, on freq of exercises adn focusign on ROM throughout the day.    Person(s) Educated Patient    Methods Explanation    Comprehension Verbalized understanding             PT Short Term Goals - 04/13/20 0841      PT SHORT TERM GOAL #1   Title Patient will be independent with HEP in order to improve functional outcomes.    Baseline 04/13/20:  Reports compliance wiht HEP daily with advanced exercises    Status Achieved      PT SHORT TERM GOAL #2   Title Patient will report at least 25% improvement in symptoms for improved quality of life.    Baseline 04/13/20:  Reports improvements by 50%.  03/30/20 - patient reports 10-15% improvement at this time.    Status Achieved             PT Long Term Goals - 05/04/20 0930      PT LONG TERM GOAL #1   Title Patient will report at least 75% improvement in symptoms for improved quality of life.    Baseline 8/25 75-85% better    Status Achieved      PT LONG TERM GOAL #2   Title Patient will be able to ambulate for at least 30 minutes with pain no greater than 1/10 in order to demonstrate improved ability to ambulate in the community.    Baseline 8/26  Reports ability to walk for 15 minutes    Status On-going      PT LONG TERM GOAL #3   Title Patient will report centralized symptoms no greater than 1/10 with all ADL in order to improve all functional mobility.    Baseline 8/26 Reports burning sensation to anterior Lt thigh ending at knee, reports no reduction.    Status On-going                 Plan - 05/04/20 0830    Clinical Impression Statement Session focused on review of HEP and goals on this date as he has not been seen for 3 weeks as he has been out of town. Reviewed ROM and answered all questions. Consolidated HEP and instructed patient to bring in exercises he feels are not as helpful to continue to work on. Extending POC additional 3 weeks from this date at 2x/week to continue to work on ROM, strength and reduction of symptoms. Minor soreness noted in lumbar spine end of session.    Personal  Factors and Comorbidities Age;Comorbidity 2;Time since onset of  injury/illness/exacerbation;Profession;Past/Current Experience    Comorbidities LBP, A fib    Examination-Activity Limitations Bed Mobility;Locomotion Level;Stand;Lift    Examination-Participation Restrictions Cleaning;Yard Work;Volunteer;Shop    Stability/Clinical Decision Making Evolving/Moderate complexity    Rehab Potential Good    PT Frequency 2x / week    PT Duration 3 weeks    PT Treatment/Interventions ADLs/Self Care Home Management;Aquatic Therapy;Biofeedback;Cryotherapy;Electrical Stimulation;Iontophoresis 4mg /ml Dexamethasone;Moist Heat;Traction;Ultrasound;Fluidtherapy;Contrast Bath;DME Instruction;Gait training;Stair training;Functional mobility training;Therapeutic activities;Therapeutic exercise;Balance training;Neuromuscular re-education;Patient/family education;Orthotic Fit/Training;Manual techniques;Manual lymph drainage;Compression bandaging;Passive range of motion;Dry needling;Energy conservation;Splinting;Taping;Vasopneumatic Device;Spinal Manipulations;Joint Manipulations    PT Next Visit Plan Continue to progress hip/spine mobility and postural/core strengthening.    PT Home Exercise Plan repeated flexion in seated, sitting tall with scapular retraction, knee to chest, double knee to chest, and quad stretch (Thomas stretch), s/l book stretch, standing extension, lumbar roll in sitting, POE           Patient will benefit from skilled therapeutic intervention in order to improve the following deficits and impairments:  Abnormal gait, Decreased activity tolerance, Decreased mobility, Decreased endurance, Difficulty walking, Decreased range of motion, Hypomobility, Postural dysfunction, Improper body mechanics, Pain, Increased fascial restricitons, Increased muscle spasms, Impaired flexibility  Visit Diagnosis: Low back pain, unspecified back pain laterality, unspecified chronicity, unspecified whether sciatica present  Muscle weakness (generalized)  Other abnormalities of  gait and mobility  Abnormal posture     Problem List Patient Active Problem List   Diagnosis Date Noted  . Atrial flutter ([place]) 09/22/2013  . Paroxysmal atrial fibrillation ([place]) 04/23/2013  . Rapid palpitations 02/26/2013  . FATIGUE 10/24/2009  . THROMBOCYTOPENIA 10/11/2009  . Chest pain 10/11/2009    9:38 AM, 05/04/20 Jerene Pitch, DPT Physical Therapy with [facility]  [phone] office   [place] [address] [place], [place], [address] Phone: [phone]   Fax:  [phone]  Name: Greg Mann MRN: [id number] Date of Birth: [birth year]

## 2020-05-09 ENCOUNTER — Encounter (HOSPITAL_COMMUNITY): Payer: Self-pay

## 2020-05-09 ENCOUNTER — Ambulatory Visit (HOSPITAL_COMMUNITY): Payer: Medicare Other

## 2020-05-09 ENCOUNTER — Other Ambulatory Visit: Payer: Self-pay

## 2020-05-09 DIAGNOSIS — R2689 Other abnormalities of gait and mobility: Secondary | ICD-10-CM

## 2020-05-09 DIAGNOSIS — R293 Abnormal posture: Secondary | ICD-10-CM | POA: Diagnosis not present

## 2020-05-09 DIAGNOSIS — M6281 Muscle weakness (generalized): Secondary | ICD-10-CM

## 2020-05-09 DIAGNOSIS — R29898 Other symptoms and signs involving the musculoskeletal system: Secondary | ICD-10-CM | POA: Diagnosis not present

## 2020-05-09 DIAGNOSIS — M545 Low back pain, unspecified: Secondary | ICD-10-CM

## 2020-05-09 NOTE — Therapy (Signed)
[place], [place], [address] Phone: [phone]   Fax:  [phone]  Physical Therapy Treatment  Patient Details  Name: Greg Mann MRN: [id number] Date of Birth: July 26, 1946 Referring Provider (PT): Arther Abbott MD   Encounter Date: 05/09/2020   PT End of Session - 05/09/20 0858    Visit Number 12    Number of Visits 18    Date for PT Re-Evaluation 05/25/20    Authorization Type Primary: Medicare Secondary BCBS supplement (follow med guidlines, no auth)    Progress Note Due on Visit 20    PT Start Time 0835    PT Stop Time 0915    PT Time Calculation (min) 40 min    Activity Tolerance Patient tolerated treatment well    Behavior During Therapy [facility] for tasks assessed/performed           Past Medical History:  Diagnosis Date  . Borderline hyperlipidemia   . Chest pain   . Dysrhythmia   . Exercise-induced tachycardia   . Ganglion    left wrist  . GERD (gastroesophageal reflux disease)   . Thrombocytopenia ([place])    borderline    Past Surgical History:  Procedure Laterality Date  . COLONOSCOPY N/A 04/22/2018   Procedure: COLONOSCOPY;  Surgeon: Daneil Dolin, MD;  [facility];  Service: Endoscopy;  Laterality: N/A;  8:30  . GANGLION CYST EXCISION     Left Wrist  . KNEE ARTHROSCOPY     Left  . TONSILLECTOMY      There were no vitals filed for this visit.   Subjective Assessment - 05/09/20 0840    Subjective Pt stated he woke up this morning and realized he doesn't have any pain today wiht a smile.  Continues to feel a pull.  Has reviewed the exercises he finds the most helpful, forgot paperwork.    Pertinent History Low back pain    Patient Stated Goals eliminate pain    Currently in Pain? No/denies                             [facility] Adult PT Treatment/Exercise - 05/09/20 0001      Posture/Postural Control   Posture/Postural Control Postural limitations     Postural Limitations Rounded Shoulders;Forward head;Decreased lumbar lordosis;Increased thoracic kyphosis      Exercises   Exercises Lumbar      Lumbar Exercises: Stretches   Active Hamstring Stretch Right;Left;2 reps;30 seconds    Active Hamstring Stretch Limitations supine hands behind knee    Single Knee to Chest Stretch Right;Left;30 seconds;2 reps    Double Knee to Chest Stretch 2 reps;30 seconds    Lower Trunk Rotation 5 reps;10 seconds    Prone on Elbows Stretch Limitations 2 minutes    Press Ups 10 reps;5 seconds    Press Ups Limitations limited range      Lumbar Exercises: Standing   Heel Raises 15 reps    Heel Raises Limitations paired with wall arch, 3" lift off    Functional Squats 10 reps    Functional Squats Limitations 3D hip excursion 10x (standing infront of chair for mechanics)    Other Standing Lumbar Exercises lumbar extension at wall x15 5"holds      Lumbar Exercises: Sidelying   Other Sidelying Lumbar Exercises book stretch x15 5" holds B       Lumbar Exercises: Quadruped   [place]/Old Horse 5  reps    [place]/Old Horse Limitations cueing for form                    PT Short Term Goals - 04/13/20 0841      PT SHORT TERM GOAL #1   Title Patient will be independent with HEP in order to improve functional outcomes.    Baseline 04/13/20:  Reports compliance wiht HEP daily with advanced exercises    Status Achieved      PT SHORT TERM GOAL #2   Title Patient will report at least 25% improvement in symptoms for improved quality of life.    Baseline 04/13/20:  Reports improvements by 50%.  03/30/20 - patient reports 10-15% improvement at this time.    Status Achieved             PT Long Term Goals - 05/04/20 0930      PT LONG TERM GOAL #1   Title Patient will report at least 75% improvement in symptoms for improved quality of life.    Baseline 8/25 75-85% better    Status Achieved      PT LONG TERM GOAL #2   Title Patient will be able to  ambulate for at least 30 minutes with pain no greater than 1/10 in order to demonstrate improved ability to ambulate in the community.    Baseline 8/26  Reports ability to walk for 15 minutes    Status On-going      PT LONG TERM GOAL #3   Title Patient will report centralized symptoms no greater than 1/10 with all ADL in order to improve all functional mobility.    Baseline 8/26 Reports burning sensation to anterior Lt thigh ending at knee, reports no reduction.    Status On-going                 Plan - 05/09/20 0900    Clinical Impression Statement Session focus with spinal mobility to improve ROM.  Added cat/cow for mobility.  Pt required some verbal and tactile cueing for proper form and mechanics.  No reports of pain through session.    Personal Factors and Comorbidities Age;Comorbidity 2;Time since onset of injury/illness/exacerbation;Profession;Past/Current Experience    Comorbidities LBP, A fib    Examination-Activity Limitations Bed Mobility;Locomotion Level;Stand;Lift    Examination-Participation Restrictions Cleaning;Yard Work;Volunteer;Shop    Stability/Clinical Decision Making Evolving/Moderate complexity    Clinical Decision Making Moderate    Rehab Potential Good    PT Frequency 2x / week    PT Duration 3 weeks    PT Treatment/Interventions ADLs/Self Care Home Management;Aquatic Therapy;Biofeedback;Cryotherapy;Electrical Stimulation;Iontophoresis 4mg /ml Dexamethasone;Moist Heat;Traction;Ultrasound;Fluidtherapy;Contrast Bath;DME Instruction;Gait training;Stair training;Functional mobility training;Therapeutic activities;Therapeutic exercise;Balance training;Neuromuscular re-education;Patient/family education;Orthotic Fit/Training;Manual techniques;Manual lymph drainage;Compression bandaging;Passive range of motion;Dry needling;Energy conservation;Splinting;Taping;Vasopneumatic Device;Spinal Manipulations;Joint Manipulations    PT Next Visit Plan Continue to progress  hip/spine mobility and postural/core strengthening.    PT Home Exercise Plan repeated flexion in seated, sitting tall with scapular retraction, knee to chest, double knee to chest, and quad stretch (Thomas stretch), s/l book stretch, standing extension, lumbar roll in sitting, POE           Patient will benefit from skilled therapeutic intervention in order to improve the following deficits and impairments:  Abnormal gait, Decreased activity tolerance, Decreased mobility, Decreased endurance, Difficulty walking, Decreased range of motion, Hypomobility, Postural dysfunction, Improper body mechanics, Pain, Increased fascial restricitons, Increased muscle spasms, Impaired flexibility  Visit Diagnosis: Low back pain, unspecified back pain laterality, unspecified chronicity, unspecified whether sciatica  present  Muscle weakness (generalized)  Other abnormalities of gait and mobility  Abnormal posture     Problem List Patient Active Problem List   Diagnosis Date Noted  . Atrial flutter ([place]) 09/22/2013  . Paroxysmal atrial fibrillation ([place]) 04/23/2013  . Rapid palpitations 02/26/2013  . FATIGUE 10/24/2009  . THROMBOCYTOPENIA 10/11/2009  . Chest pain 10/11/2009   Ihor Austin, LPTA/CLT; CBIS [phone]  Aldona Lento 05/09/2020, [place] [address] [place], [place], [address] Phone: [phone]   Fax:  [phone]  Name: AURON TADROS MRN: [id number] Date of Birth: 1946-01-28

## 2020-05-11 ENCOUNTER — Encounter (HOSPITAL_COMMUNITY): Payer: Self-pay | Admitting: Physical Therapy

## 2020-05-11 ENCOUNTER — Ambulatory Visit (HOSPITAL_COMMUNITY): Payer: Medicare Other | Attending: Orthopedic Surgery | Admitting: Physical Therapy

## 2020-05-11 ENCOUNTER — Other Ambulatory Visit: Payer: Self-pay

## 2020-05-11 DIAGNOSIS — R2689 Other abnormalities of gait and mobility: Secondary | ICD-10-CM | POA: Diagnosis not present

## 2020-05-11 DIAGNOSIS — R293 Abnormal posture: Secondary | ICD-10-CM | POA: Diagnosis not present

## 2020-05-11 DIAGNOSIS — M545 Low back pain, unspecified: Secondary | ICD-10-CM

## 2020-05-11 DIAGNOSIS — R29898 Other symptoms and signs involving the musculoskeletal system: Secondary | ICD-10-CM | POA: Insufficient documentation

## 2020-05-11 DIAGNOSIS — M6281 Muscle weakness (generalized): Secondary | ICD-10-CM | POA: Insufficient documentation

## 2020-05-11 NOTE — Therapy (Signed)
[place], [place], [address] Phone: [phone]   Fax:  [phone]  Physical Therapy Treatment  Patient Details  Name: Greg Mann MRN: [id number] Date of Birth: [birth year] Referring Provider (PT): Arther Abbott MD   Encounter Date: 05/11/2020   PT End of Session - 05/11/20 0902    Visit Number 13    Number of Visits 18    Date for PT Re-Evaluation 05/25/20    Authorization Type Primary: Medicare Secondary BCBS supplement (follow med guidlines, no auth)    Progress Note Due on Visit 20    PT Start Time 0830    PT Stop Time 0910    PT Time Calculation (min) 40 min    Activity Tolerance Patient tolerated treatment well    Behavior During Therapy [facility] for tasks assessed/performed           Past Medical History:  Diagnosis Date  . Borderline hyperlipidemia   . Chest pain   . Dysrhythmia   . Exercise-induced tachycardia   . Ganglion    left wrist  . GERD (gastroesophageal reflux disease)   . Thrombocytopenia ([place])    borderline    Past Surgical History:  Procedure Laterality Date  . COLONOSCOPY N/A 04/22/2018   Procedure: COLONOSCOPY;  Surgeon: Daneil Dolin, MD;  [facility];  Service: Endoscopy;  Laterality: N/A;  8:30  . GANGLION CYST EXCISION     Left Wrist  . KNEE ARTHROSCOPY     Left  . TONSILLECTOMY      There were no vitals filed for this visit.   Subjective Assessment - 05/11/20 0833    Subjective PT states that he is painfree this morning    Pertinent History Low back pain    Limitations Walking;Standing;House hold activities    How long can you sit comfortably? unlimited    How long can you stand comfortably? Able to stand comfortably for various amounts of time, does c/o increased Lt thigh pain following walking for 10-15 minutes.    How long can you walk comfortably? 15 minutes, does report symptoms have reduced    Patient Stated Goals eliminate pain     Currently in Pain? No/denies                    [facility] Adult PT Treatment/Exercise - 05/11/20 0001      Exercises   Exercises Lumbar      Lumbar Exercises: Stretches   Prone on Elbows Stretch Limitations 2 minutes    Press Ups 10 reps;5 seconds    Quad Stretch Right;Left;2 reps;30 seconds      Lumbar Exercises: Standing   Other Standing Lumbar Exercises wall arch x 15     Other Standing Lumbar Exercises 3 d exersions       Lumbar Exercises: Seated   Sit to Stand 10 reps    Other Seated Lumbar Exercises thoracic excursions       Lumbar Exercises: Prone   Straight Leg Raise 10 reps    Opposite Arm/Leg Raise Limitations to weak to obtain good form                     PT Short Term Goals - 04/13/20 0841      PT SHORT TERM GOAL #1   Title Patient will be independent with HEP in order to improve functional outcomes.    Baseline 04/13/20:  Reports compliance wiht HEP daily with advanced  exercises    Status Achieved      PT SHORT TERM GOAL #2   Title Patient will report at least 25% improvement in symptoms for improved quality of life.    Baseline 04/13/20:  Reports improvements by 50%.  03/30/20 - patient reports 10-15% improvement at this time.    Status Achieved             PT Long Term Goals - 05/04/20 0930      PT LONG TERM GOAL #1   Title Patient will report at least 75% improvement in symptoms for improved quality of life.    Baseline 8/25 75-85% better    Status Achieved      PT LONG TERM GOAL #2   Title Patient will be able to ambulate for at least 30 minutes with pain no greater than 1/10 in order to demonstrate improved ability to ambulate in the community.    Baseline 8/26  Reports ability to walk for 15 minutes    Status On-going      PT LONG TERM GOAL #3   Title Patient will report centralized symptoms no greater than 1/10 with all ADL in order to improve all functional mobility.    Baseline 8/26 Reports burning sensation to anterior Lt  thigh ending at knee, reports no reduction.    Status On-going                 Plan - 05/11/20 0902    Clinical Impression Statement Continued to focus on spinal mobility as well as gluteal strength.  Added excursions and hamstring stretch for HEP .  Unable to complete press ups or opposite arm leg due to weakness in UE>    Personal Factors and Comorbidities Age;Comorbidity 2;Time since onset of injury/illness/exacerbation;Profession;Past/Current Experience    Comorbidities LBP, A fib    Examination-Activity Limitations Bed Mobility;Locomotion Level;Stand;Lift    Examination-Participation Restrictions Cleaning;Yard Work;Volunteer;Shop    Stability/Clinical Decision Making Evolving/Moderate complexity    Rehab Potential Good    PT Frequency 2x / week    PT Duration 3 weeks    PT Treatment/Interventions ADLs/Self Care Home Management;Aquatic Therapy;Biofeedback;Cryotherapy;Electrical Stimulation;Iontophoresis 4mg /ml Dexamethasone;Moist Heat;Traction;Ultrasound;Fluidtherapy;Contrast Bath;DME Instruction;Gait training;Stair training;Functional mobility training;Therapeutic activities;Therapeutic exercise;Balance training;Neuromuscular re-education;Patient/family education;Orthotic Fit/Training;Manual techniques;Manual lymph drainage;Compression bandaging;Passive range of motion;Dry needling;Energy conservation;Splinting;Taping;Vasopneumatic Device;Spinal Manipulations;Joint Manipulations    PT Next Visit Plan Continue to progress hip/spine mobility and postural/core strengthening.    PT Home Exercise Plan repeated flexion in seated, sitting tall with scapular retraction, knee to chest, double knee to chest, and quad stretch (Thomas stretch), s/l book stretch, standing extension, lumbar roll in sitting, POE; 9/2 hip and thoracic excursions. hamstring stretch           Patient will benefit from skilled therapeutic intervention in order to improve the following deficits and impairments:   Abnormal gait, Decreased activity tolerance, Decreased mobility, Decreased endurance, Difficulty walking, Decreased range of motion, Hypomobility, Postural dysfunction, Improper body mechanics, Pain, Increased fascial restricitons, Increased muscle spasms, Impaired flexibility  Visit Diagnosis: Low back pain, unspecified back pain laterality, unspecified chronicity, unspecified whether sciatica present  Muscle weakness (generalized)  Other abnormalities of gait and mobility  Abnormal posture     Problem List Patient Active Problem List   Diagnosis Date Noted  . Atrial flutter ([place]) 09/22/2013  . Paroxysmal atrial fibrillation ([place]) 04/23/2013  . Rapid palpitations 02/26/2013  . FATIGUE 10/24/2009  . THROMBOCYTOPENIA 10/11/2009  . Chest pain 10/11/2009    Rayetta Humphrey, PT CLT [phone] 05/11/2020, 9:17 AM  [place], [place], [address] Phone: [phone]   Fax:  [phone]  Name: ALTUS ZAINO MRN: [id number] Date of Birth: 01-Mar-1946

## 2020-05-16 ENCOUNTER — Other Ambulatory Visit: Payer: Self-pay

## 2020-05-16 ENCOUNTER — Ambulatory Visit (HOSPITAL_COMMUNITY): Payer: Medicare Other

## 2020-05-16 ENCOUNTER — Encounter (HOSPITAL_COMMUNITY): Payer: Self-pay

## 2020-05-16 DIAGNOSIS — R2689 Other abnormalities of gait and mobility: Secondary | ICD-10-CM | POA: Diagnosis not present

## 2020-05-16 DIAGNOSIS — R29898 Other symptoms and signs involving the musculoskeletal system: Secondary | ICD-10-CM | POA: Diagnosis not present

## 2020-05-16 DIAGNOSIS — M545 Low back pain, unspecified: Secondary | ICD-10-CM

## 2020-05-16 DIAGNOSIS — M6281 Muscle weakness (generalized): Secondary | ICD-10-CM

## 2020-05-16 DIAGNOSIS — R293 Abnormal posture: Secondary | ICD-10-CM | POA: Diagnosis not present

## 2020-05-16 NOTE — Therapy (Signed)
[facility] [place], [place], [address] Phone: [phone]   Fax:  [phone]  Physical Therapy Treatment  Patient Details  Name: Greg Mann MRN: [id number] Date of Birth: 1946/02/27 Referring Provider (PT): Arther Abbott MD   Encounter Date: 05/16/2020   PT End of Session - 05/16/20 1544    Visit Number 14    Number of Visits 18    Date for PT Re-Evaluation 05/25/20    Authorization Type Primary: Medicare Secondary BCBS supplement (follow med guidlines, no auth)    Progress Note Due on Visit 20    PT Start Time 1539   late for apt   PT Stop Time 1613    PT Time Calculation (min) 34 min    Activity Tolerance Patient tolerated treatment well    Behavior During Therapy [facility] for tasks assessed/performed           Past Medical History:  Diagnosis Date  . Borderline hyperlipidemia   . Chest pain   . Dysrhythmia   . Exercise-induced tachycardia   . Ganglion    left wrist  . GERD (gastroesophageal reflux disease)   . Thrombocytopenia ([place])    borderline    Past Surgical History:  Procedure Laterality Date  . COLONOSCOPY N/A 04/22/2018   Procedure: COLONOSCOPY;  Surgeon: Daneil Dolin, MD;  [facility];  Service: Endoscopy;  Laterality: N/A;  8:30  . GANGLION CYST EXCISION     Left Wrist  . KNEE ARTHROSCOPY     Left  . TONSILLECTOMY      There were no vitals filed for this visit.   Subjective Assessment - 05/16/20 1543    Subjective Pt reports he had some pain this morning, mainly tenderness in center of lower back.    Pertinent History Low back pain    Patient Stated Goals eliminate pain    Currently in Pain? Yes    Pain Score 5     Pain Location Back    Pain Orientation Lower    Pain Descriptors / Indicators Tender    Pain Type Chronic pain    Pain Onset More than a month ago    Pain Frequency Intermittent    Aggravating Factors  not sure    Pain Relieving Factors not sure                              [facility] Adult PT Treatment/Exercise - 05/16/20 0001      Exercises   Exercises Lumbar      Lumbar Exercises: Stretches   Prone on Elbows Stretch Limitations 2 minutes    Press Ups 10 reps;5 seconds      Lumbar Exercises: Standing   Heel Raises 15 reps    Heel Raises Limitations paired with wall arch, 3" lift off    Functional Squats 10 reps    Functional Squats Limitations 3D hip excursion 10x (standing infront of chair for mechanics)    Other Standing Lumbar Exercises wall pushups 10x2 sets      Lumbar Exercises: Seated   Other Seated Lumbar Exercises thoracic rotation 5x       Lumbar Exercises: Prone   Straight Leg Raise 10 reps    Opposite Arm/Leg Raise 10 reps;Right arm/Left leg;Left arm/Right leg;3 seconds    Opposite Arm/Leg Raise Limitations improved mechanics  PT Short Term Goals - 04/13/20 0841      PT SHORT TERM GOAL #1   Title Patient will be independent with HEP in order to improve functional outcomes.    Baseline 04/13/20:  Reports compliance wiht HEP daily with advanced exercises    Status Achieved      PT SHORT TERM GOAL #2   Title Patient will report at least 25% improvement in symptoms for improved quality of life.    Baseline 04/13/20:  Reports improvements by 50%.  03/30/20 - patient reports 10-15% improvement at this time.    Status Achieved             PT Long Term Goals - 05/04/20 0930      PT LONG TERM GOAL #1   Title Patient will report at least 75% improvement in symptoms for improved quality of life.    Baseline 8/25 75-85% better    Status Achieved      PT LONG TERM GOAL #2   Title Patient will be able to ambulate for at least 30 minutes with pain no greater than 1/10 in order to demonstrate improved ability to ambulate in the community.    Baseline 8/26  Reports ability to walk for 15 minutes    Status On-going      PT LONG TERM GOAL #3   Title Patient will report  centralized symptoms no greater than 1/10 with all ADL in order to improve all functional mobility.    Baseline 8/26 Reports burning sensation to anterior Lt thigh ending at knee, reports no reduction.    Status On-going                 Plan - 05/16/20 1555    Clinical Impression Statement Added wall pushups for postural and UE strengthening.  Reviewed mechanics wiht 3D hip excursion with cueing to improve hip mobility and reduce shoulder movements.  Pt reports increased radicular symptoms following squats, educated importance of proper mechanics for maximal benefits.  Symptoms resolved following prone extension based exercises.    Personal Factors and Comorbidities Age;Comorbidity 2;Time since onset of injury/illness/exacerbation;Profession;Past/Current Experience    Comorbidities LBP, A fib    Examination-Activity Limitations Bed Mobility;Locomotion Level;Stand;Lift    Examination-Participation Restrictions Cleaning;Yard Work;Volunteer;Shop    Stability/Clinical Decision Making Evolving/Moderate complexity    Clinical Decision Making Moderate    Rehab Potential Good    PT Frequency 2x / week    PT Duration 3 weeks    PT Treatment/Interventions ADLs/Self Care Home Management;Aquatic Therapy;Biofeedback;Cryotherapy;Electrical Stimulation;Iontophoresis 4mg /ml Dexamethasone;Moist Heat;Traction;Ultrasound;Fluidtherapy;Contrast Bath;DME Instruction;Gait training;Stair training;Functional mobility training;Therapeutic activities;Therapeutic exercise;Balance training;Neuromuscular re-education;Patient/family education;Orthotic Fit/Training;Manual techniques;Manual lymph drainage;Compression bandaging;Passive range of motion;Dry needling;Energy conservation;Splinting;Taping;Vasopneumatic Device;Spinal Manipulations;Joint Manipulations    PT Next Visit Plan Continue to progress hip/spine mobility and postural/core strengthening.    PT Home Exercise Plan repeated flexion in seated, sitting tall with  scapular retraction, knee to chest, double knee to chest, and quad stretch (Thomas stretch), s/l book stretch, standing extension, lumbar roll in sitting, POE; 9/2 hip and thoracic excursions. hamstring stretch           Patient will benefit from skilled therapeutic intervention in order to improve the following deficits and impairments:  Abnormal gait, Decreased activity tolerance, Decreased mobility, Decreased endurance, Difficulty walking, Decreased range of motion, Hypomobility, Postural dysfunction, Improper body mechanics, Pain, Increased fascial restricitons, Increased muscle spasms, Impaired flexibility  Visit Diagnosis: Low back pain, unspecified back pain laterality, unspecified chronicity, unspecified whether sciatica present  Muscle weakness (generalized)  Other abnormalities of gait and mobility  Abnormal posture     Problem List Patient Active Problem List   Diagnosis Date Noted  . Atrial flutter ([place]) 09/22/2013  . Paroxysmal atrial fibrillation ([place]) 04/23/2013  . Rapid palpitations 02/26/2013  . FATIGUE 10/24/2009  . THROMBOCYTOPENIA 10/11/2009  . Chest pain 10/11/2009   Ihor Austin, LPTA/CLT; CBIS [phone]   Aldona Lento 05/16/2020, 4:20 PM  [facility] [address] [place], [place], [address] Phone: [phone]   Fax:  [phone]  Name: Greg Mann MRN: [id number] Date of Birth: 1946/01/18

## 2020-05-18 ENCOUNTER — Ambulatory Visit (HOSPITAL_COMMUNITY): Payer: Medicare Other | Admitting: Physical Therapy

## 2020-05-18 ENCOUNTER — Other Ambulatory Visit: Payer: Self-pay

## 2020-05-18 DIAGNOSIS — R2689 Other abnormalities of gait and mobility: Secondary | ICD-10-CM | POA: Diagnosis not present

## 2020-05-18 DIAGNOSIS — M545 Low back pain, unspecified: Secondary | ICD-10-CM

## 2020-05-18 DIAGNOSIS — R293 Abnormal posture: Secondary | ICD-10-CM | POA: Diagnosis not present

## 2020-05-18 DIAGNOSIS — M6281 Muscle weakness (generalized): Secondary | ICD-10-CM

## 2020-05-18 DIAGNOSIS — R29898 Other symptoms and signs involving the musculoskeletal system: Secondary | ICD-10-CM

## 2020-05-18 NOTE — Patient Instructions (Signed)
Piriformis Stretch, Sitting    Sit, one ankle on opposite knee, same-side hand on crossed knee. Push down on knee, keeping spine straight. Lean torso forward, with flat back, until tension is felt in hamstrings and gluteals of crossed-leg side. Hold _30__ seconds. Repeat _3__ times per session. Do _2__ sessions per day.  Copyright  VHI. All rights reserved.   

## 2020-05-18 NOTE — Therapy (Signed)
[facility] [place], [place], [address] Phone: [phone]   Fax:  [phone]  Physical Therapy Treatment  Patient Details  Name: Greg Mann MRN: [id number] Date of Birth: [birth year] Referring Provider (PT): Arther Abbott MD   Encounter Date: 05/18/2020   PT End of Session - 05/18/20 1629    Visit Number 15    Number of Visits 18    Date for PT Re-Evaluation 05/25/20    Authorization Type Primary: Medicare Secondary BCBS supplement (follow med guidlines, no auth)    Progress Note Due on Visit 20    PT Start Time 1450    PT Stop Time 1536    PT Time Calculation (min) 46 min    Activity Tolerance Patient tolerated treatment well    Behavior During Therapy [facility] for tasks assessed/performed           Past Medical History:  Diagnosis Date  . Borderline hyperlipidemia   . Chest pain   . Dysrhythmia   . Exercise-induced tachycardia   . Ganglion    left wrist  . GERD (gastroesophageal reflux disease)   . Thrombocytopenia ([place])    borderline    Past Surgical History:  Procedure Laterality Date  . COLONOSCOPY N/A 04/22/2018   Procedure: COLONOSCOPY;  Surgeon: Daneil Dolin, MD;  [facility];  Service: Endoscopy;  Laterality: N/A;  8:30  . GANGLION CYST EXCISION     Left Wrist  . KNEE ARTHROSCOPY     Left  . TONSILLECTOMY      There were no vitals filed for this visit.   Subjective Assessment - 05/18/20 1455    Subjective pt states he's having more discomfort than pain and states the morning is usually worse than other times.    Currently in Pain? No/denies                             [facility] Adult PT Treatment/Exercise - 05/18/20 0001      Lumbar Exercises: Stretches   Piriformis Stretch Right;Left;2 reps;30 seconds    Piriformis Stretch Limitations seated      Lumbar Exercises: Standing   Heel Raises 15 reps    Functional Squats 10 reps    Functional Squats  Limitations 3D hip    Forward Lunge 10 reps    Forward Lunge Limitations onto 6" box no UE for core stab    Other Standing Lumbar Exercises wall pushups 10x2 sets      Lumbar Exercises: Supine   Bridge 15 reps    Straight Leg Raise 15 reps      Lumbar Exercises: Prone   Single Arm Raise 10 reps    Straight Leg Raise 10 reps    Opposite Arm/Leg Raise Limitations too difficult                    PT Short Term Goals - 04/13/20 0841      PT SHORT TERM GOAL #1   Title Patient will be independent with HEP in order to improve functional outcomes.    Baseline 04/13/20:  Reports compliance wiht HEP daily with advanced exercises    Status Achieved      PT SHORT TERM GOAL #2   Title Patient will report at least 25% improvement in symptoms for improved quality of life.    Baseline 04/13/20:  Reports improvements by 50%.  03/30/20 - patient reports 10-15% improvement  at this time.    Status Achieved             PT Long Term Goals - 05/04/20 0930      PT LONG TERM GOAL #1   Title Patient will report at least 75% improvement in symptoms for improved quality of life.    Baseline 8/25 75-85% better    Status Achieved      PT LONG TERM GOAL #2   Title Patient will be able to ambulate for at least 30 minutes with pain no greater than 1/10 in order to demonstrate improved ability to ambulate in the community.    Baseline 8/26  Reports ability to walk for 15 minutes    Status On-going      PT LONG TERM GOAL #3   Title Patient will report centralized symptoms no greater than 1/10 with all ADL in order to improve all functional mobility.    Baseline 8/26 Reports burning sensation to anterior Lt thigh ending at knee, reports no reduction.    Status On-going                 Plan - 05/18/20 1630    Clinical Impression Statement continued with established therex with cues for posture and proper mechanics.  Pt with noted relief completing extension based therex.  Added piriformis  stretch this session in seated with  good results; added this to HEP.  Pt continued to be painfree at EOS and is pleased with how much therapy has helped.    Personal Factors and Comorbidities Age;Comorbidity 2;Time since onset of injury/illness/exacerbation;Profession;Past/Current Experience    Comorbidities LBP, A fib    Examination-Activity Limitations Bed Mobility;Locomotion Level;Stand;Lift    Examination-Participation Restrictions Cleaning;Yard Work;Volunteer;Shop    Stability/Clinical Decision Making Evolving/Moderate complexity    Rehab Potential Good    PT Frequency 2x / week    PT Duration 3 weeks    PT Treatment/Interventions ADLs/Self Care Home Management;Aquatic Therapy;Biofeedback;Cryotherapy;Electrical Stimulation;Iontophoresis 4mg /ml Dexamethasone;Moist Heat;Traction;Ultrasound;Fluidtherapy;Contrast Bath;DME Instruction;Gait training;Stair training;Functional mobility training;Therapeutic activities;Therapeutic exercise;Balance training;Neuromuscular re-education;Patient/family education;Orthotic Fit/Training;Manual techniques;Manual lymph drainage;Compression bandaging;Passive range of motion;Dry needling;Energy conservation;Splinting;Taping;Vasopneumatic Device;Spinal Manipulations;Joint Manipulations    PT Next Visit Plan Continue to progress hip/spine mobility and postural/core strengthening.    PT Home Exercise Plan repeated flexion in seated, sitting tall with scapular retraction, knee to chest, double knee to chest, and quad stretch (Thomas stretch), s/l book stretch, standing extension, lumbar roll in sitting, POE; 9/2 hip and thoracic excursions. hamstring stretch  9/9:  seated piriformis stretch           Patient will benefit from skilled therapeutic intervention in order to improve the following deficits and impairments:  Abnormal gait, Decreased activity tolerance, Decreased mobility, Decreased endurance, Difficulty walking, Decreased range of motion, Hypomobility,  Postural dysfunction, Improper body mechanics, Pain, Increased fascial restricitons, Increased muscle spasms, Impaired flexibility  Visit Diagnosis: Low back pain, unspecified back pain laterality, unspecified chronicity, unspecified whether sciatica present  Muscle weakness (generalized)  Other abnormalities of gait and mobility  Abnormal posture  Other symptoms and signs involving the musculoskeletal system     Problem List Patient Active Problem List   Diagnosis Date Noted  . Atrial flutter ([place]) 09/22/2013  . Paroxysmal atrial fibrillation ([place]) 04/23/2013  . Rapid palpitations 02/26/2013  . FATIGUE 10/24/2009  . THROMBOCYTOPENIA 10/11/2009  . Chest pain 10/11/2009   Teena Irani, PTA/CLT [phone]  Teena Irani 05/18/2020, 4:33 PM  [facility] [place], [place],  [place] Phone: [phone]   Fax:  [phone]  Name: Greg Mann MRN: [id number] Date of Birth: 03-28-1946

## 2020-05-23 ENCOUNTER — Other Ambulatory Visit: Payer: Self-pay

## 2020-05-23 ENCOUNTER — Ambulatory Visit (HOSPITAL_COMMUNITY): Payer: Medicare Other | Admitting: Physical Therapy

## 2020-05-23 DIAGNOSIS — R29898 Other symptoms and signs involving the musculoskeletal system: Secondary | ICD-10-CM | POA: Diagnosis not present

## 2020-05-23 DIAGNOSIS — R293 Abnormal posture: Secondary | ICD-10-CM | POA: Diagnosis not present

## 2020-05-23 DIAGNOSIS — R2689 Other abnormalities of gait and mobility: Secondary | ICD-10-CM

## 2020-05-23 DIAGNOSIS — M545 Low back pain, unspecified: Secondary | ICD-10-CM

## 2020-05-23 DIAGNOSIS — M6281 Muscle weakness (generalized): Secondary | ICD-10-CM | POA: Diagnosis not present

## 2020-05-23 NOTE — Therapy (Signed)
[facility] [place], [place], [address] Phone: [phone]   Fax:  [phone]  Physical Therapy Treatment  Patient Details  Name: Greg Mann MRN: [id number] Date of Birth: [birth year] Referring Provider (PT): Arther Abbott MD   Encounter Date: 05/23/2020   PT End of Session - 05/23/20 1023    Visit Number 16    Number of Visits 18    Date for PT Re-Evaluation 05/25/20    Authorization Type Primary: Medicare Secondary BCBS supplement (follow med guidlines, no auth)    Progress Note Due on Visit 20    PT Start Time 1006    PT Stop Time 1048    PT Time Calculation (min) 42 min           Past Medical History:  Diagnosis Date  . Borderline hyperlipidemia   . Chest pain   . Dysrhythmia   . Exercise-induced tachycardia   . Ganglion    left wrist  . GERD (gastroesophageal reflux disease)   . Thrombocytopenia ([place])    borderline    Past Surgical History:  Procedure Laterality Date  . COLONOSCOPY N/A 04/22/2018   Procedure: COLONOSCOPY;  Surgeon: Daneil Dolin, MD;  [facility];  Service: Endoscopy;  Laterality: N/A;  8:30  . GANGLION CYST EXCISION     Left Wrist  . KNEE ARTHROSCOPY     Left  . TONSILLECTOMY      There were no vitals filed for this visit.                      [facility] Adult PT Treatment/Exercise - 05/23/20 0001      Lumbar Exercises: Stretches   Piriformis Stretch Right;Left;2 reps;30 seconds (P)     Piriformis Stretch Limitations seated (P)       Lumbar Exercises: Standing   Heel Raises 20 reps    Functional Squats 10 reps    Functional Squats Limitations 3D hip    Forward Lunge 20 reps    Forward Lunge Limitations onto 6" box no UE for core stab    Scapular Retraction Strengthening;15 reps;Theraband (P)     Theraband Level (Scapular Retraction) Level 3 (Green) (P)     Row Strengthening;15 reps;Theraband (P)     Theraband Level (Row) Level 3 (Green)  (P)     Shoulder Extension Strengthening;15 reps;Theraband (P)     Theraband Level (Shoulder Extension) Level 3 (Green) (P)     Other Standing Lumbar Exercises wall pushups 10x2 sets    Other Standing Lumbar Exercises corner stretch for chest 3X20" (P)       Lumbar Exercises: Supine   Bridge 20 reps (P)     Straight Leg Raise 20 reps (P)       Lumbar Exercises: Prone   Single Arm Raise 10 reps (P)     Straight Leg Raise 10 reps (P)                     PT Short Term Goals - 04/13/20 7035      PT SHORT TERM GOAL #1   Title Patient will be independent with HEP in order to improve functional outcomes.    Baseline 04/13/20:  Reports compliance wiht HEP daily with advanced exercises    Status Achieved      PT SHORT TERM GOAL #2   Title Patient will report at least 25% improvement in symptoms for improved quality of life.  Baseline 04/13/20:  Reports improvements by 50%.  03/30/20 - patient reports 10-15% improvement at this time.    Status Achieved             PT Long Term Goals - 05/04/20 0930      PT LONG TERM GOAL #1   Title Patient will report at least 75% improvement in symptoms for improved quality of life.    Baseline 8/25 75-85% better    Status Achieved      PT LONG TERM GOAL #2   Title Patient will be able to ambulate for at least 30 minutes with pain no greater than 1/10 in order to demonstrate improved ability to ambulate in the community.    Baseline 8/26  Reports ability to walk for 15 minutes    Status On-going      PT LONG TERM GOAL #3   Title Patient will report centralized symptoms no greater than 1/10 with all ADL in order to improve all functional mobility.    Baseline 8/26 Reports burning sensation to anterior Lt thigh ending at knee, reports no reduction.    Status On-going                 Plan - 05/23/20 1322    Clinical Impression Statement Continued with established POC.  Remains stiff throughout hip/trunk region with cues for  isolated movements in hips rather than Upper body.  Reviewed theraband that was previously given for HEP; pt unable to recall and complete in correct form requiring verbal and tacile cues.  Added corner chest stretch as pt with forward shoulders, tight mm.  No spasms or tightness palpated throughout lumbar and sacral regions today.  Overall improving with reduced occurances of radiculopathy and pain.    Personal Factors and Comorbidities Age;Comorbidity 2;Time since onset of injury/illness/exacerbation;Profession;Past/Current Experience    Comorbidities LBP, A fib    Examination-Activity Limitations Bed Mobility;Locomotion Level;Stand;Lift    Examination-Participation Restrictions Cleaning;Yard Work;Volunteer;Shop    Stability/Clinical Decision Making Evolving/Moderate complexity    Rehab Potential Good    PT Frequency 2x / week    PT Duration 3 weeks    PT Treatment/Interventions ADLs/Self Care Home Management;Aquatic Therapy;Biofeedback;Cryotherapy;Electrical Stimulation;Iontophoresis 4mg /ml Dexamethasone;Moist Heat;Traction;Ultrasound;Fluidtherapy;Contrast Bath;DME Instruction;Gait training;Stair training;Functional mobility training;Therapeutic activities;Therapeutic exercise;Balance training;Neuromuscular re-education;Patient/family education;Orthotic Fit/Training;Manual techniques;Manual lymph drainage;Compression bandaging;Passive range of motion;Dry needling;Energy conservation;Splinting;Taping;Vasopneumatic Device;Spinal Manipulations;Joint Manipulations    PT Next Visit Plan complete reassessment next visit.    PT Home Exercise Plan repeated flexion in seated, sitting tall with scapular retraction, knee to chest, double knee to chest, and quad stretch (Thomas stretch), s/l book stretch, standing extension, lumbar roll in sitting, POE; 9/2 hip and thoracic excursions. hamstring stretch  9/9:  seated piriformis stretch           Patient will benefit from skilled therapeutic intervention in  order to improve the following deficits and impairments:  Abnormal gait, Decreased activity tolerance, Decreased mobility, Decreased endurance, Difficulty walking, Decreased range of motion, Hypomobility, Postural dysfunction, Improper body mechanics, Pain, Increased fascial restricitons, Increased muscle spasms, Impaired flexibility  Visit Diagnosis: Low back pain, unspecified back pain laterality, unspecified chronicity, unspecified whether sciatica present  Muscle weakness (generalized)  Other abnormalities of gait and mobility  Abnormal posture  Other symptoms and signs involving the musculoskeletal system     Problem List Patient Active Problem List   Diagnosis Date Noted  . Atrial flutter ([place]) 09/22/2013  . Paroxysmal atrial fibrillation ([place]) 04/23/2013  . Rapid palpitations 02/26/2013  . FATIGUE 10/24/2009  .  THROMBOCYTOPENIA 10/11/2009  . Chest pain 10/11/2009   Teena Irani, PTA/CLT [phone]  Teena Irani 05/23/2020, 1:24 PM  [facility] [address] [place], [place], [address] Phone: [phone]   Fax:  [phone]  Name: Greg Mann MRN: [id number] Date of Birth: 07-Feb-1946

## 2020-05-25 ENCOUNTER — Encounter (HOSPITAL_COMMUNITY): Payer: Self-pay | Admitting: Physical Therapy

## 2020-05-25 ENCOUNTER — Ambulatory Visit (HOSPITAL_COMMUNITY): Payer: Medicare Other | Admitting: Physical Therapy

## 2020-05-25 ENCOUNTER — Other Ambulatory Visit: Payer: Self-pay

## 2020-05-25 DIAGNOSIS — R293 Abnormal posture: Secondary | ICD-10-CM | POA: Diagnosis not present

## 2020-05-25 DIAGNOSIS — R2689 Other abnormalities of gait and mobility: Secondary | ICD-10-CM

## 2020-05-25 DIAGNOSIS — R29898 Other symptoms and signs involving the musculoskeletal system: Secondary | ICD-10-CM | POA: Diagnosis not present

## 2020-05-25 DIAGNOSIS — M545 Low back pain, unspecified: Secondary | ICD-10-CM

## 2020-05-25 DIAGNOSIS — M6281 Muscle weakness (generalized): Secondary | ICD-10-CM | POA: Diagnosis not present

## 2020-05-25 NOTE — Therapy (Signed)
[facility] [address] [place], [place], [address] Phone: [phone]   Fax:  [phone]  Physical Therapy Treatment  Patient Details  Name: Greg Mann MRN: [id number] Date of Birth: 11-16-1945 Referring Provider (PT): Arther Abbott MD  PHYSICAL THERAPY DISCHARGE SUMMARY  Visits from Start of Care: 17  Current functional level related to goals / functional outcomes: See below    Remaining deficits: Gluteal strength / ROM   Education / Equipment: HEP Plan: Patient agrees to discharge.  Patient goals were met. Patient is being discharged due to meeting the stated rehab goals.  ?????      Encounter Date: 05/25/2020   PT End of Session - 05/25/20 1448    Visit Number 17    Number of Visits 17   Date for PT Re-Evaluation 05/25/20    Authorization Type Primary: Medicare Secondary BCBS supplement (follow med guidlines, no auth)    Progress Note Due on Visit 17    PT Start Time 1405    PT Stop Time 1445    PT Time Calculation (min) 40 min    Activity Tolerance Patient tolerated treatment well    Behavior During Therapy WFL for tasks assessed/performed           Past Medical History:  Diagnosis Date  . Borderline hyperlipidemia   . Chest pain   . Dysrhythmia   . Exercise-induced tachycardia   . Ganglion    left wrist  . GERD (gastroesophageal reflux disease)   . Thrombocytopenia ([facility] [place])    borderline    Past Surgical History:  Procedure Laterality Date  . COLONOSCOPY N/A 04/22/2018   Procedure: COLONOSCOPY;  Surgeon: Daneil Dolin, MD;  [facility];  Service: Endoscopy;  Laterality: N/A;  8:30  . GANGLION CYST EXCISION     Left Wrist  . KNEE ARTHROSCOPY     Left  . TONSILLECTOMY      There were no vitals filed for this visit.   Subjective Assessment - 05/25/20 1409    Subjective PT states that he is feeling well he is only having some soreness in his back now.  He is exercising twice a  day.    Pertinent History Low back pain    Limitations Walking;Standing;House hold activities    How long can you sit comfortably? unlimited    How long can you stand comfortably? standing for 20 minutes then his back hurts    How long can you walk comfortably? Able to walk without having increased pain.    Currently in Pain? No/denies   more soreness             [facility] PT Assessment - 05/25/20 0001      Assessment   Medical Diagnosis Neuropathy of LLE/ Lumbar    Referring Provider (PT) Arther Abbott MD    Onset Date/Surgical Date 11/14/19    Next MD Visit Next week    Prior Therapy for knee surgery      Precautions   Precautions None      Restrictions   Weight Bearing Restrictions No      Prior Function   Level of Independence Independent    Vocation Full time employment    Vocation Requirements [employer]      Cognition   Overall Cognitive Status Within Functional Limits for tasks assessed      Observation/Other Assessments   Observations Ambulate without AD    Focus on Therapeutic Outcomes (FOTO)  not  completed      Sensation   Light Touch Appears Intact      Posture/Postural Control   Posture/Postural Control Postural limitations    Postural Limitations Rounded Shoulders;Forward head;Decreased lumbar lordosis      AROM   Overall AROM Comments tender in back with AROM no change in leg    Lumbar Flexion 25% limited no change in symptoms   was limited 25%    Lumbar Extension 75% limited no change in symptoms   continues to be 75% limited increases his sx    Lumbar - Right Side Bend 50% limited no change in symptoms   continues to be limited 50%    Lumbar - Left Side Bend 50% limited no change in symptoms   continues to be limited 50%   Lumbar - Right Rotation 25% limited no change in symptoms    Lumbar - Left Rotation 25% limited no change in symptoms      Strength   Right Hip Flexion 5/5   was 4/5    Right Hip Extension 4+/5    Right Hip ABduction  5/5   was 4+    Left Hip Flexion 5/5   was 4/5    Left Hip Extension 4/5   was 4/5    Left Hip ABduction 5/5   was 4+    Right Knee Flexion 5/5   was 4+   Right Knee Extension 5/5   was 4+   Left Knee Flexion 5/5   was 4+    Left Knee Extension 5/5    Right Ankle Dorsiflexion 5/5    Left Ankle Dorsiflexion 5/5      Ambulation/Gait   Ambulation/Gait Yes    Ambulation/Gait Assistance 7: Independent    Ambulation Distance (Feet) 476 Feet   was 425    Assistive device None    Gait Pattern Trunk flexed    Gait Comments 2 MWT                         [facility] Adult PT Treatment/Exercise - 05/25/20 0001      Exercises   Exercises Lumbar      Lumbar Exercises: Stretches   Lower Trunk Rotation 5 reps    Prone on Elbows Stretch Limitations 2 minutes       Lumbar Exercises: Prone   Straight Leg Raise 10 reps                    PT Short Term Goals - 05/25/20 1439      PT SHORT TERM GOAL #1   Title Patient will be independent with HEP in order to improve functional outcomes.    Baseline 04/13/20:  Reports compliance wiht HEP daily with advanced exercises    Status Achieved      PT SHORT TERM GOAL #2   Title Patient will report at least 25% improvement in symptoms for improved quality of life.    Baseline 04/13/20:  Reports improvements by 50%.  03/30/20 - patient reports 10-15% improvement at this time.    Status Achieved             PT Long Term Goals - 05/25/20 1439      PT LONG TERM GOAL #1   Title Patient will report at least 75% improvement in symptoms for improved quality of life.    Baseline 8/25 75-85% better    Status Achieved      PT LONG TERM GOAL #  2   Title Patient will be able to ambulate for at least 30 minutes with pain no greater than 1/10 in order to demonstrate improved ability to ambulate in the community.    Baseline 8/26  Reports ability to walk for 15 minutes    Status Partially Met      PT LONG TERM GOAL #3   Title Patient will  report centralized symptoms no greater than 1/10 with all ADL in order to improve all functional mobility.    Baseline 8/26 Reports burning sensation to anterior Lt thigh ending at knee, reports no reduction.    Status Achieved                 Plan - 05/25/20 1445    Clinical Impression Statement Pt reassessed.  Pt ROM remains significantly limited, however pt is I in his mobility exercises.  All LE mm have returned to normal except for B gluteus medius pt will continue to do exercises specific to strengthening this mm as well.  Pt Is completely I in HEP and agrees with discharge.    Personal Factors and Comorbidities Age;Comorbidity 2;Time since onset of injury/illness/exacerbation;Profession;Past/Current Experience    Comorbidities LBP, A fib    Examination-Activity Limitations Bed Mobility;Locomotion Level;Stand;Lift    Examination-Participation Restrictions Cleaning;Yard Work;Volunteer;Shop    Stability/Clinical Decision Making Evolving/Moderate complexity    Rehab Potential Good    PT Frequency 2x / week    PT Duration 3 weeks    PT Treatment/Interventions ADLs/Self Care Home Management;Aquatic Therapy;Biofeedback;Cryotherapy;Electrical Stimulation;Iontophoresis 75m/ml Dexamethasone;Moist Heat;Traction;Ultrasound;Fluidtherapy;Contrast Bath;DME Instruction;Gait training;Stair training;Functional mobility training;Therapeutic activities;Therapeutic exercise;Balance training;Neuromuscular re-education;Patient/family education;Orthotic Fit/Training;Manual techniques;Manual lymph drainage;Compression bandaging;Passive range of motion;Dry needling;Energy conservation;Splinting;Taping;Vasopneumatic Device;Spinal Manipulations;Joint Manipulations    PT Next Visit Plan Discharge.    PT Home Exercise Plan repeated flexion in seated, sitting tall with scapular retraction, knee to chest, double knee to chest, and quad stretch (Thomas stretch), s/l book stretch, standing extension, lumbar roll in  sitting, POE; 9/2 hip and thoracic excursions. hamstring stretch  9/9:  seated piriformis stretch           Patient will benefit from skilled therapeutic intervention in order to improve the following deficits and impairments:  Abnormal gait, Decreased activity tolerance, Decreased mobility, Decreased endurance, Difficulty walking, Decreased range of motion, Hypomobility, Postural dysfunction, Improper body mechanics, Pain, Increased fascial restricitons, Increased muscle spasms, Impaired flexibility  Visit Diagnosis: Low back pain, unspecified back pain laterality, unspecified chronicity, unspecified whether sciatica present  Muscle weakness (generalized)  Other abnormalities of gait and mobility  Abnormal posture  Other symptoms and signs involving the musculoskeletal system     Problem List Patient Active Problem List   Diagnosis Date Noted  . [place]rial flutter ([facility] 09/22/2013  . [place]oxysmal [place] fibrillation [place] 04/23/2013  . Rapid palpitations 02/26/2013  . FATIGUE 10/24/2009  . THROMBOCYTOPENIA 10/11/2009  . Chest pain 10/11/2009    CRayetta Humphrey PT CLT [phone]/16/2021, 2:49 PM  [facility] [address]Phone: [phone]  Fax:  [phone] Name: Greg MONKSMRN: [id number] of Birth: 604/08/1946

## 2020-05-30 ENCOUNTER — Encounter (HOSPITAL_COMMUNITY): Payer: Medicare Other | Admitting: Physical Therapy

## 2020-06-01 ENCOUNTER — Encounter (HOSPITAL_COMMUNITY): Payer: Medicare Other | Admitting: Physical Therapy

## 2020-08-11 ENCOUNTER — Ambulatory Visit (INDEPENDENT_AMBULATORY_CARE_PROVIDER_SITE_OTHER): Payer: Medicare Other | Admitting: Internal Medicine

## 2020-08-11 ENCOUNTER — Other Ambulatory Visit: Payer: Self-pay

## 2020-08-11 ENCOUNTER — Encounter: Payer: Self-pay | Admitting: Internal Medicine

## 2020-08-11 VITALS — BP 134/80 | HR 58 | Ht 71.0 in | Wt 188.0 lb

## 2020-08-11 DIAGNOSIS — I480 Paroxysmal atrial fibrillation: Secondary | ICD-10-CM

## 2020-08-11 MED ORDER — APIXABAN 5 MG PO TABS: 5.0000 mg | ORAL_TABLET | Freq: Two times a day (BID) | ORAL | 11 refills | Status: DC

## 2020-08-11 NOTE — Patient Instructions (Addendum)
Medication Instructions:  Your physician recommends that you continue on your current medications as directed. Please refer to the Current Medication list given to you today.  Stop Taking Aspirin  Start Taking Eliquis 5 mg Two Times Daily   *If you need a refill on your cardiac medications before your next appointment, please call your pharmacy*   Lab Work: NONE   If you have labs (blood work) drawn today and your tests are completely normal, you will receive your results only by: Marland Kitchen MyChart Message (if you have MyChart) OR . A paper copy in the mail If you have any lab test that is abnormal or we need to change your treatment, we will call you to review the results.   Testing/Procedures: NONE    Follow-Up: At [facility], you and your health needs are our priority.  As part of our continuing mission to provide you with exceptional heart care, we have created designated Provider Care Teams.  These Care Teams include your primary Cardiologist (physician) and Advanced Practice Providers (APPs -  Physician Assistants and Nurse Practitioners) who all work together to provide you with the care you need, when you need it.  We recommend signing up for the patient portal called "MyChart".  Sign up information is provided on this After Visit Summary.  MyChart is used to connect with patients for Virtual Visits (Telemedicine).  Patients are able to view lab/test results, encounter notes, upcoming appointments, etc.  Non-urgent messages can be sent to your provider as well.   To learn more about what you can do with MyChart, go to [email or web address].    Your next appointment:   6 month(s)  The format for your next appointment:   In Person  Provider:   Cristopher Peru, MD   Other Instructions Thank you for choosing [facility]!

## 2020-08-11 NOTE — Progress Notes (Signed)
HPI [name] returns today for followup. He is a pleasant 74 yo man with a h/o PAF and HTN. He has been on flecainide 50 mg bid and takes an occaisional 1/2 tab as needed for break through. He denies chest pain or sob. He will retire in a couple of weeks. He has not had chest pain, sob, or syncope or peripheral edema.  No Known Allergies   Current Outpatient Medications  Medication Sig Dispense Refill  . aspirin EC 81 MG tablet Take 81 mg by mouth daily.    . flecainide (TAMBOCOR) 100 MG tablet Take 1 tablet (100 mg total) by mouth in the morning. 30 tablet 6  . Multiple Vitamin (MULTIVITAMIN) tablet Take 1 tablet by mouth daily.       No current facility-administered medications for this visit.     Past Medical History:  Diagnosis Date  . Borderline hyperlipidemia   . Chest pain   . Dysrhythmia   . Exercise-induced tachycardia   . Ganglion    left wrist  . GERD (gastroesophageal reflux disease)   . Thrombocytopenia ([place])    borderline    ROS:   All systems reviewed and negative except as noted in the HPI.   Past Surgical History:  Procedure Laterality Date  . COLONOSCOPY N/A 04/22/2018   Procedure: COLONOSCOPY;  Surgeon: Daneil Dolin, MD;  [facility];  Service: Endoscopy;  Laterality: N/A;  8:30  . GANGLION CYST EXCISION     Left Wrist  . KNEE ARTHROSCOPY     Left  . TONSILLECTOMY       Family History  Problem Relation Age of Onset  . Aneurysm Father        Aortic  . Coronary artery disease Brother        PCI     Social History   Socioeconomic History  . Marital status: Married    Spouse name: Not on file  . Number of children: Not on file  . Years of education: Not on file  . Highest education level: Not on file  Occupational History  . Occupation: Press photographer for [employer], [employer] forms  Tobacco Use  . Smoking status: Never Smoker  . Smokeless tobacco: Never Used  Vaping Use  . Vaping Use: Never used    Substance and Sexual Activity  . Alcohol use: Yes    Alcohol/week: 0.0 standard drinks    Comment: Occasional wine  . Drug use: No  . Sexual activity: Not on file  Other Topics Concern  . Not on file  Social History Narrative   Married, lives locally, 2 adult children   No regular exercise   Social Determinants of Health   Financial Resource Strain:   . Difficulty of Paying Living Expenses: Not on file  Food Insecurity:   . Worried About Charity fundraiser in the Last Year: Not on file  . Ran Out of Food in the Last Year: Not on file  Transportation Needs:   . Lack of Transportation (Medical): Not on file  . Lack of Transportation (Non-Medical): Not on file  Physical Activity:   . Days of Exercise per Week: Not on file  . Minutes of Exercise per Session: Not on file  Stress:   . Feeling of Stress : Not on file  Social Connections:   . Frequency of Communication with Friends and Family: Not on file  . Frequency of Social Gatherings with Friends and Family: Not on  file  . Attends Religious Services: Not on file  . Active Member of Clubs or Organizations: Not on file  . Attends Archivist Meetings: Not on file  . Marital Status: Not on file  Intimate Partner Violence:   . Fear of Current or Ex-Partner: Not on file  . Emotionally Abused: Not on file  . Physically Abused: Not on file  . Sexually Abused: Not on file     BP 134/80   Pulse (!) 58   Ht 5\' 11"  (1.803 m)   Wt 188 lb (85.3 kg)   SpO2 99%   BMI 26.22 kg/m   Physical Exam:  Well appearing NAD HEENT: Unremarkable Neck:  No JVD, no thyromegally Lymphatics:  No adenopathy Back:  No CVA tenderness Lungs:  Clear with no wheezes HEART:  Regular rate rhythm, no murmurs, no rubs, no clicks Abd:  soft, positive bowel sounds, no organomegally, no rebound, no guarding Ext:  2 plus pulses, no edema, no cyanosis, no clubbing Skin:  No rashes no nodules Neuro:  CN II through XII intact, motor grossly  intact  EKG - nsr with normal axis/intervals  Assess/Plan: 1. PAF - he is doing well. We discussed taking additional meds as needed. I told him if the break throughs worsen, then he could take 150 mg daily in divided doses. 2. HTN - his bp is fairly well controlled. No change in meds. 3. Coags - he is now CHADSVASC 2 and will need to consider starting Eliquis.   Carleene Overlie Taos Tapp,MD

## 2020-09-27 ENCOUNTER — Ambulatory Visit: Payer: Medicare HMO | Admitting: Orthopedic Surgery

## 2020-10-09 ENCOUNTER — Encounter: Payer: Self-pay | Admitting: Orthopedic Surgery

## 2020-10-09 ENCOUNTER — Other Ambulatory Visit: Payer: Self-pay

## 2020-10-09 ENCOUNTER — Ambulatory Visit: Payer: Medicare HMO | Admitting: Orthopedic Surgery

## 2020-10-09 VITALS — BP 144/76 | HR 61 | Ht 71.0 in | Wt 180.0 lb

## 2020-10-09 DIAGNOSIS — M5137 Other intervertebral disc degeneration, lumbosacral region: Secondary | ICD-10-CM | POA: Diagnosis not present

## 2020-10-09 NOTE — Patient Instructions (Signed)
Call us if :  Severe back or leg pain   Foot drop weakness in the leg

## 2020-10-09 NOTE — Progress Notes (Signed)
Chief Complaint  Patient presents with  . Hip Pain    Left,    75 year old male MRI showed multilevel disc disease he had some left lower extremity radicular symptoms which are now resolved with only some lower back pain when he standing at the sink  We have given him some parameters for calling us back including weakness severe pain back or leg giving out symptoms of the left leg he is agreeable to that we will see him on an as-needed basis  Encounter Diagnosis  Name Primary?  . DDD (degenerative disc disease), lumbosacral Yes

## 2020-10-24 DIAGNOSIS — D696 Thrombocytopenia, unspecified: Secondary | ICD-10-CM | POA: Diagnosis not present

## 2020-10-24 DIAGNOSIS — K5900 Constipation, unspecified: Secondary | ICD-10-CM | POA: Diagnosis not present

## 2020-10-24 DIAGNOSIS — Z125 Encounter for screening for malignant neoplasm of prostate: Secondary | ICD-10-CM | POA: Diagnosis not present

## 2020-10-24 DIAGNOSIS — R944 Abnormal results of kidney function studies: Secondary | ICD-10-CM | POA: Diagnosis not present

## 2020-10-24 DIAGNOSIS — R7301 Impaired fasting glucose: Secondary | ICD-10-CM | POA: Diagnosis not present

## 2020-10-24 DIAGNOSIS — R000 Tachycardia, unspecified: Secondary | ICD-10-CM | POA: Diagnosis not present

## 2020-10-24 DIAGNOSIS — M79605 Pain in left leg: Secondary | ICD-10-CM | POA: Diagnosis not present

## 2020-10-24 DIAGNOSIS — M71332 Other bursal cyst, left wrist: Secondary | ICD-10-CM | POA: Diagnosis not present

## 2020-10-24 DIAGNOSIS — N529 Male erectile dysfunction, unspecified: Secondary | ICD-10-CM | POA: Diagnosis not present

## 2020-10-24 DIAGNOSIS — K219 Gastro-esophageal reflux disease without esophagitis: Secondary | ICD-10-CM | POA: Diagnosis not present

## 2020-10-24 DIAGNOSIS — Z0001 Encounter for general adult medical examination with abnormal findings: Secondary | ICD-10-CM | POA: Diagnosis not present

## 2020-10-26 ENCOUNTER — Other Ambulatory Visit: Payer: Self-pay | Admitting: Internal Medicine

## 2020-10-26 DIAGNOSIS — M71332 Other bursal cyst, left wrist: Secondary | ICD-10-CM | POA: Diagnosis not present

## 2020-10-26 DIAGNOSIS — I480 Paroxysmal atrial fibrillation: Secondary | ICD-10-CM | POA: Diagnosis not present

## 2020-10-26 DIAGNOSIS — K5900 Constipation, unspecified: Secondary | ICD-10-CM | POA: Diagnosis not present

## 2020-10-26 DIAGNOSIS — K219 Gastro-esophageal reflux disease without esophagitis: Secondary | ICD-10-CM | POA: Diagnosis not present

## 2020-10-26 DIAGNOSIS — N529 Male erectile dysfunction, unspecified: Secondary | ICD-10-CM | POA: Diagnosis not present

## 2020-10-26 DIAGNOSIS — K635 Polyp of colon: Secondary | ICD-10-CM | POA: Diagnosis not present

## 2020-10-26 DIAGNOSIS — D696 Thrombocytopenia, unspecified: Secondary | ICD-10-CM | POA: Diagnosis not present

## 2020-10-26 DIAGNOSIS — R7301 Impaired fasting glucose: Secondary | ICD-10-CM | POA: Diagnosis not present

## 2020-10-26 DIAGNOSIS — M5137 Other intervertebral disc degeneration, lumbosacral region: Secondary | ICD-10-CM | POA: Diagnosis not present

## 2020-10-26 DIAGNOSIS — Z0001 Encounter for general adult medical examination with abnormal findings: Secondary | ICD-10-CM | POA: Diagnosis not present

## 2020-10-26 NOTE — Telephone Encounter (Signed)
This is a [place] pt.  °

## 2020-11-29 IMAGING — DX DG LUMBAR SPINE COMPLETE 4+V
5 series · 5 of 5 positions shown · non-contrast
Comparison: None.

CLINICAL DATA: Low back pain and left leg pain, initial encounter

EXAM:
LUMBAR SPINE - COMPLETE 4+ VIEW

[l-spine ap]
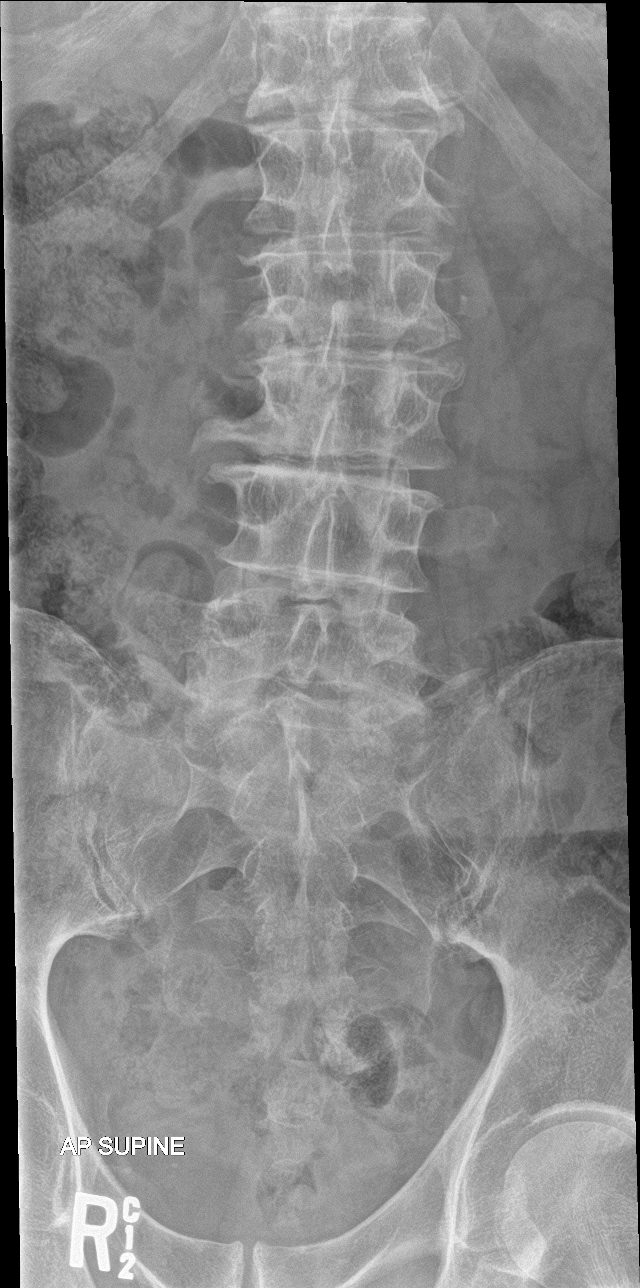

[l-spine obl (1 of 2)]
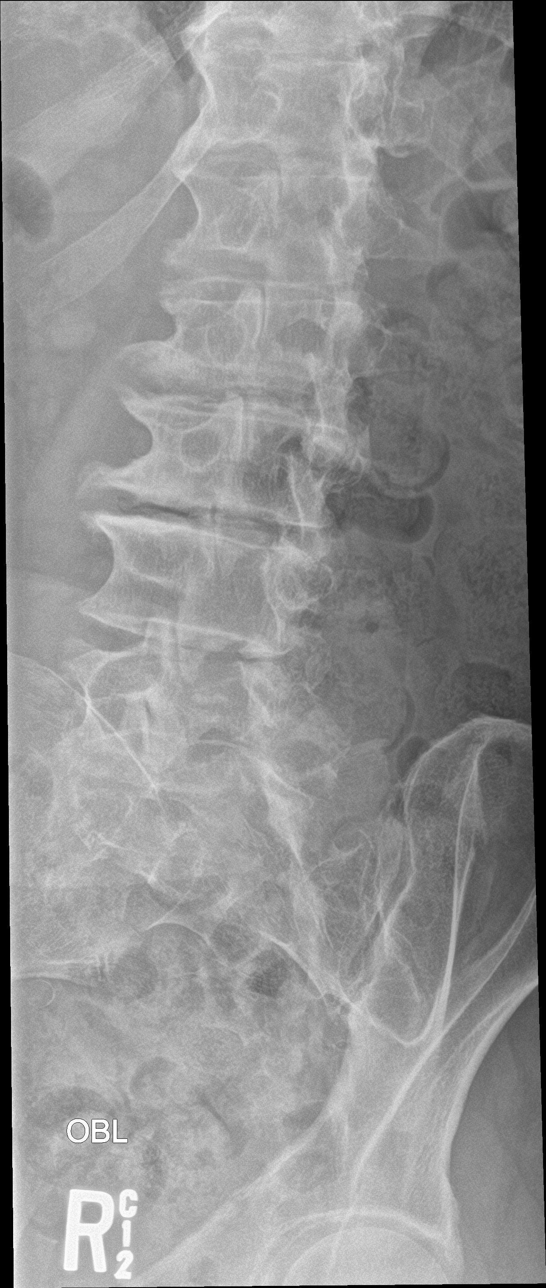

[l-spine obl (2 of 2)]
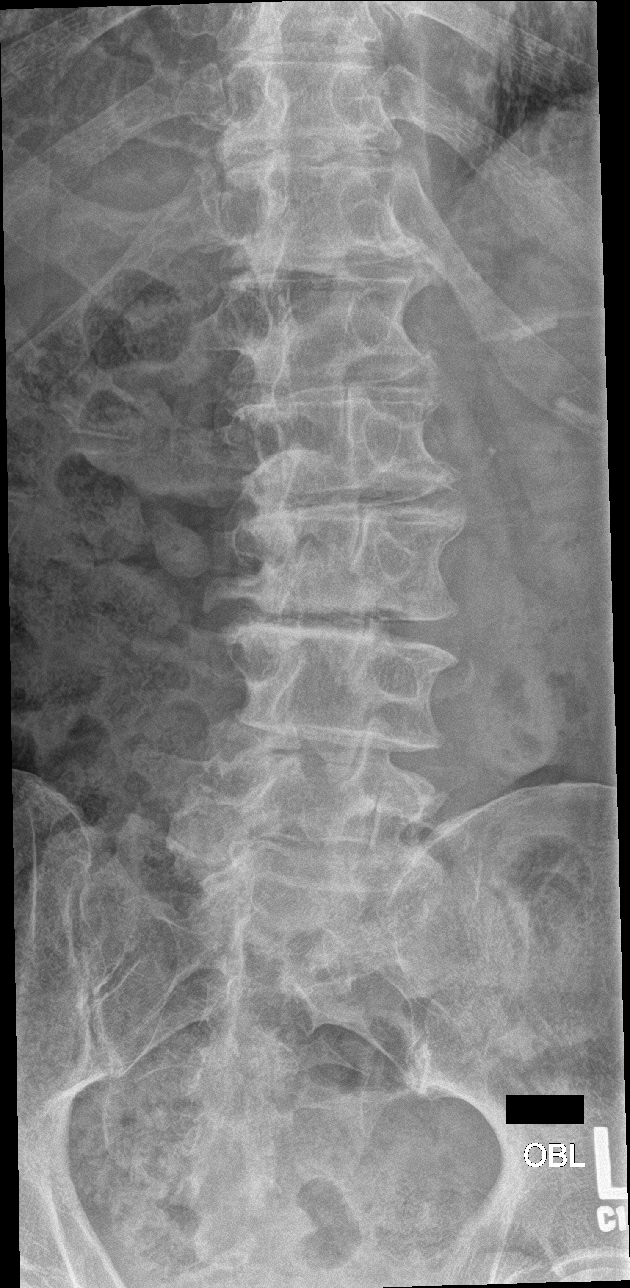

[l-spine lat]
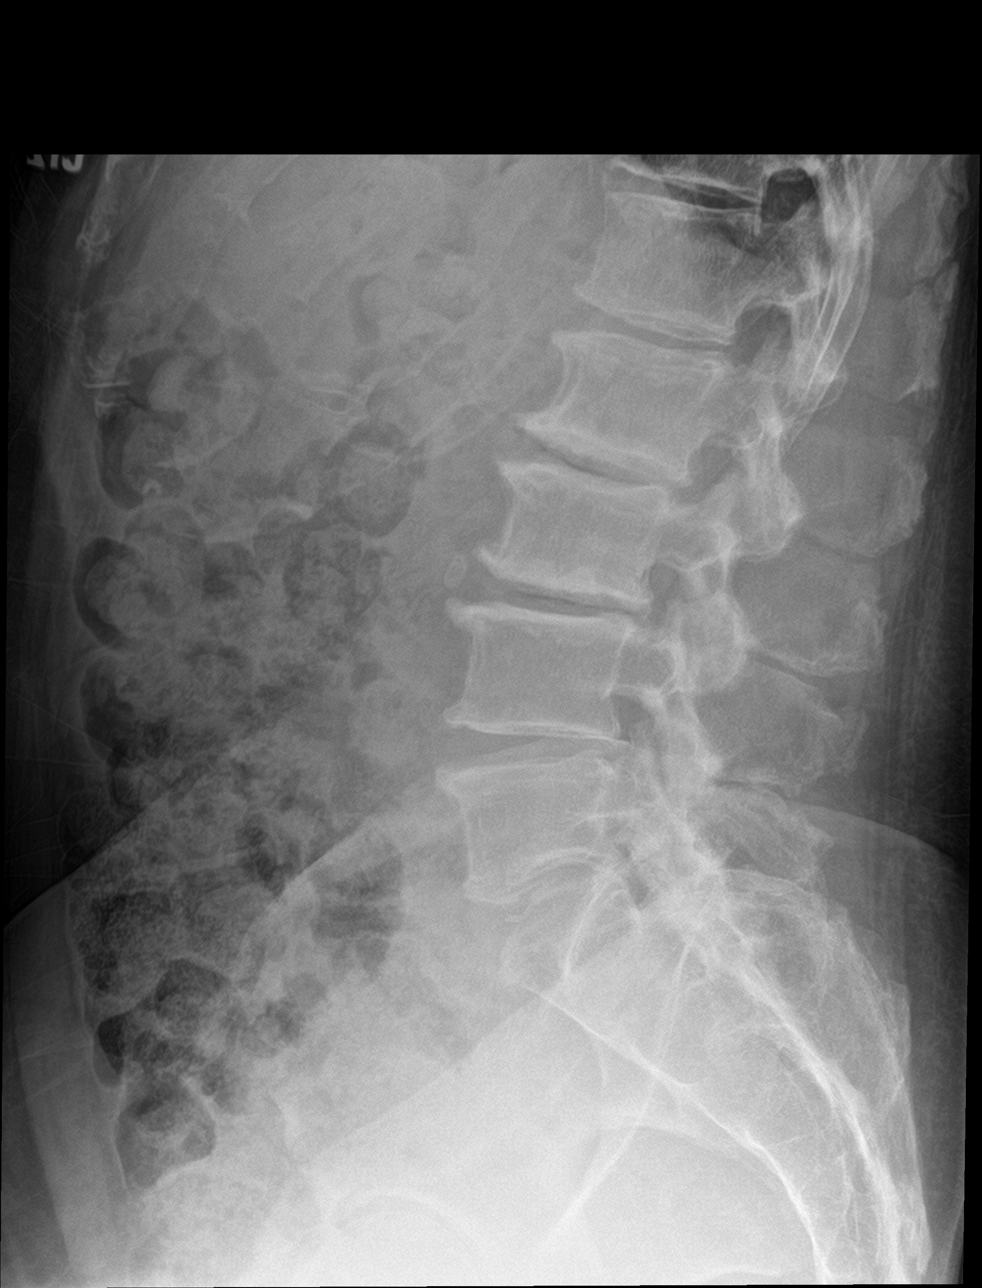

[l-spine spot]
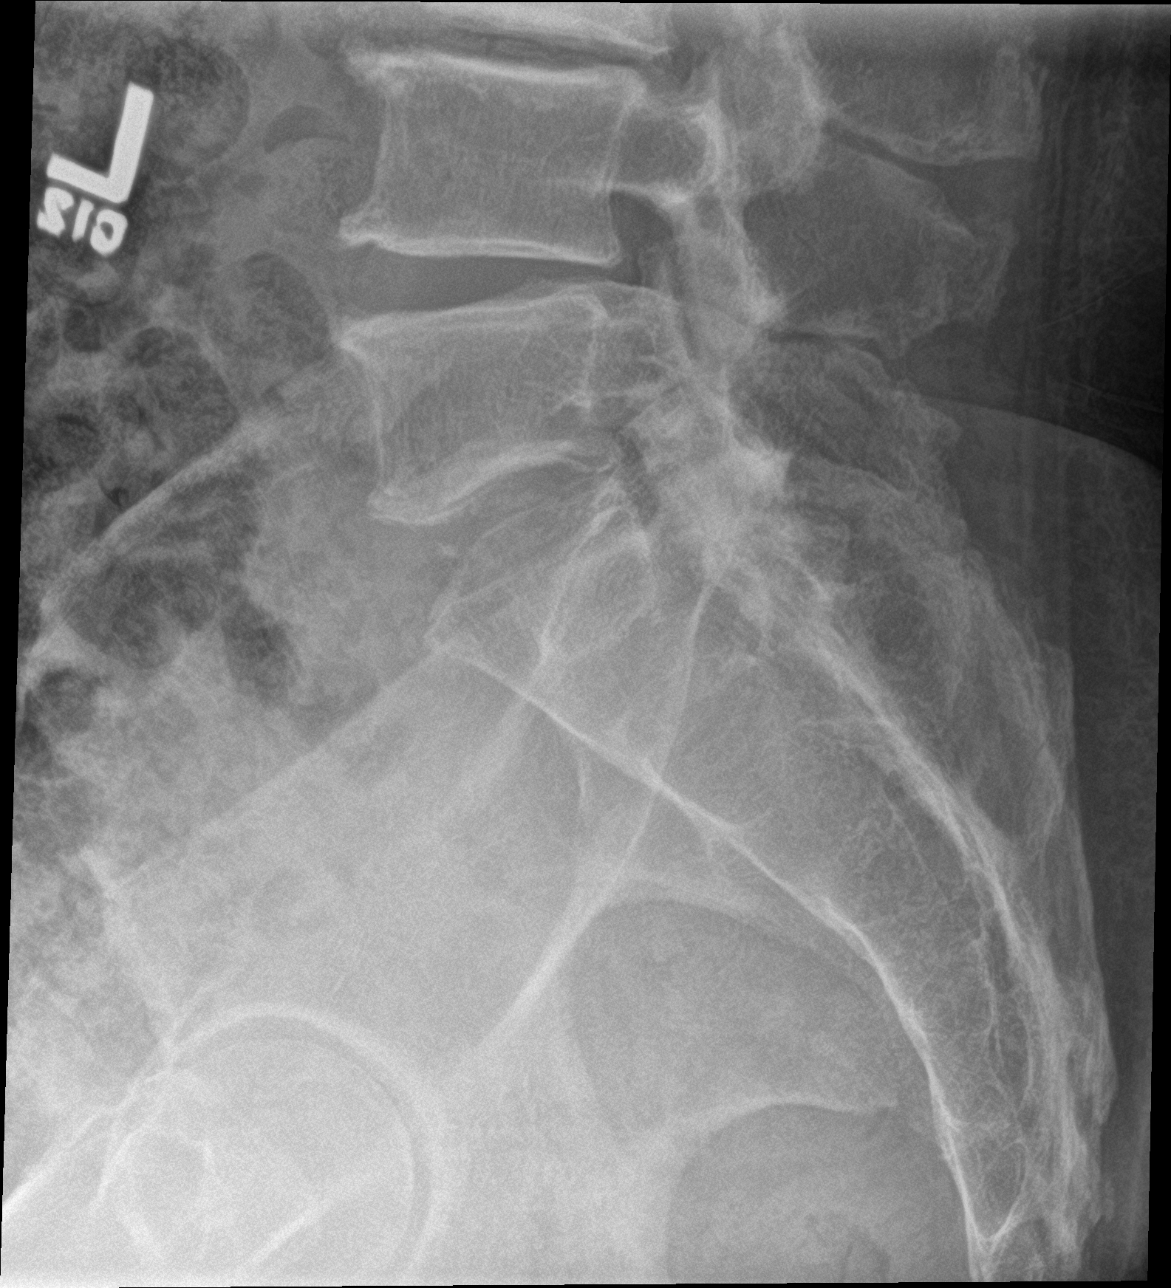

[5 of 5 positions shown; findings below may reference images not displayed]

FINDINGS: Five lumbar type vertebral bodies are well visualized. Vertebral
body height is well maintained. No pars defects are seen.
Degenerative osteophytic changes are seen. Multilevel disc space
narrowing from L2-L5 is noted. No soft tissue abnormality is seen.
IMPRESSION: Multilevel degenerative change without acute abnormality.

## 2021-02-27 ENCOUNTER — Ambulatory Visit: Payer: Medicare Other | Admitting: Internal Medicine

## 2021-03-13 ENCOUNTER — Encounter: Payer: Self-pay | Admitting: Internal Medicine

## 2021-03-13 ENCOUNTER — Ambulatory Visit: Payer: Medicare HMO | Admitting: Internal Medicine

## 2021-03-13 ENCOUNTER — Other Ambulatory Visit: Payer: Self-pay

## 2021-03-13 VITALS — BP 98/58 | HR 71 | Ht 71.0 in | Wt 183.4 lb

## 2021-03-13 DIAGNOSIS — I480 Paroxysmal atrial fibrillation: Secondary | ICD-10-CM | POA: Diagnosis not present

## 2021-03-13 NOTE — Patient Instructions (Signed)
Medication Instructions:  Your physician recommends that you continue on your current medications as directed. Please refer to the Current Medication list given to you today.  *If you need a refill on your cardiac medications before your next appointment, please call your pharmacy*   Lab Work: NONE   If you have labs (blood work) drawn today and your tests are completely normal, you will receive your results only by: . MyChart Message (if you have MyChart) OR . A paper copy in the mail If you have any lab test that is abnormal or we need to change your treatment, we will call you to review the results.   Testing/Procedures: NONE    Follow-Up: At [facility], you and your health needs are our priority.  As part of our continuing mission to provide you with exceptional heart care, we have created designated Provider Care Teams.  These Care Teams include your primary Cardiologist (physician) and Advanced Practice Providers (APPs -  Physician Assistants and Nurse Practitioners) who all work together to provide you with the care you need, when you need it.  We recommend signing up for the patient portal called "MyChart".  Sign up information is provided on this After Visit Summary.  MyChart is used to connect with patients for Virtual Visits (Telemedicine).  Patients are able to view lab/test results, encounter notes, upcoming appointments, etc.  Non-urgent messages can be sent to your provider as well.   To learn more about what you can do with MyChart, go to [email or web address].    Your next appointment:   1 year(s)  The format for your next appointment:   In Person  Provider:   Gregg Taylor, MD   Other Instructions Thank you for choosing [facility]!    

## 2021-03-13 NOTE — Progress Notes (Signed)
HPI [name] returns today for followup. He is a pleasant 75 yo man with a h/o PAF and HTN. He has been on flecainide 50 mg bid and takes an occaisional 1/2 tab as needed for break through. He notes that he takes 150 mg a day about 5 days a month. He denies chest pain or sob. He has not had syncope. He has not had chest pain, sob, or syncope or peripheral edema.  No Known Allergies   Current Outpatient Medications  Medication Sig Dispense Refill   apixaban (ELIQUIS) 5 MG TABS tablet Take 1 tablet (5 mg total) by mouth 2 (two) times daily. 60 tablet 11   flecainide (TAMBOCOR) 100 MG tablet TAKE (1) TABLET BY MOUTH ONCE DAILY. 90 tablet 3   Multiple Vitamin (MULTIVITAMIN) tablet Take 1 tablet by mouth daily.     No current facility-administered medications for this visit.     Past Medical History:  Diagnosis Date   Borderline hyperlipidemia    Chest pain    Dysrhythmia    Exercise-induced tachycardia    Ganglion    left wrist   GERD (gastroesophageal reflux disease)    Thrombocytopenia ([facility])    borderline    ROS:   All systems reviewed and negative except as noted in the HPI.   Past Surgical History:  Procedure Laterality Date   COLONOSCOPY N/A 04/22/2018   Procedure: COLONOSCOPY;  Surgeon: Daneil Dolin, MD;  [facility];  Service: Endoscopy;  Laterality: N/A;  8:30   GANGLION CYST EXCISION     Left Wrist   KNEE ARTHROSCOPY     Left   TONSILLECTOMY       Family History  Problem Relation Age of Onset   Aneurysm Father        Aortic   Coronary artery disease Brother        PCI     Social History   Socioeconomic History   Marital status: Married    Spouse name: Not on file   Number of children: Not on file   Years of education: Not on file   Highest education level: Not on file  Occupational History   Occupation: Press photographer for [employer], Freight forwarder of concrete forms  Tobacco Use   Smoking status: Never   Smokeless tobacco:  Never  Vaping Use   Vaping Use: Never used  Substance and Sexual Activity   Alcohol use: Yes    Alcohol/week: 0.0 standard drinks    Comment: Occasional wine   Drug use: No   Sexual activity: Not on file  Other Topics Concern   Not on file  Social History Narrative   Married, lives locally, 2 adult children   No regular exercise   Social Determinants of Health   Financial Resource Strain: Not on file  Food Insecurity: Not on file  Transportation Needs: Not on file  Physical Activity: Not on file  Stress: Not on file  Social Connections: Not on file  Intimate Partner Violence: Not on file     BP (!) 98/58   Pulse 71   Ht 5\' 11"  (1.803 m)   Wt 183 lb 6.4 oz (83.2 kg)   SpO2 98%   BMI 25.58 kg/m   Physical Exam:  Well appearing NAD HEENT: Unremarkable Neck:  No JVD, no thyromegally Lymphatics:  No adenopathy Back:  No CVA tenderness Lungs:  Clear with no wheezes HEART:  Regular rate rhythm, no murmurs, no rubs, no clicks Abd:  soft, positive bowel sounds, no organomegally, no rebound, no guarding Ext:  2 plus pulses, no edema, no cyanosis, no clubbing Skin:  No rashes no nodules Neuro:  CN II through XII intact, motor grossly intact   Assess/Plan:  PAF - he is doing well on fairly low dose flecainide. He will continue taking 100 mg daily, taking an extra 50 mg as needed. Coags - he will continue taking eliquis. HTN - his bp is well controlled. He will avoid salty foods.  Carleene Overlie Shizuye Rupert,MD

## 2021-04-19 DIAGNOSIS — D2271 Melanocytic nevi of right lower limb, including hip: Secondary | ICD-10-CM | POA: Diagnosis not present

## 2021-04-19 DIAGNOSIS — Z1283 Encounter for screening for malignant neoplasm of skin: Secondary | ICD-10-CM | POA: Diagnosis not present

## 2021-04-19 DIAGNOSIS — X32XXXA Exposure to sunlight, initial encounter: Secondary | ICD-10-CM | POA: Diagnosis not present

## 2021-04-19 DIAGNOSIS — L570 Actinic keratosis: Secondary | ICD-10-CM | POA: Diagnosis not present

## 2021-04-19 DIAGNOSIS — D225 Melanocytic nevi of trunk: Secondary | ICD-10-CM | POA: Diagnosis not present

## 2021-04-19 DIAGNOSIS — D485 Neoplasm of uncertain behavior of skin: Secondary | ICD-10-CM | POA: Diagnosis not present

## 2021-04-25 DIAGNOSIS — Z20822 Contact with and (suspected) exposure to covid-19: Secondary | ICD-10-CM | POA: Diagnosis not present

## 2021-05-16 DIAGNOSIS — I4891 Unspecified atrial fibrillation: Secondary | ICD-10-CM | POA: Diagnosis not present

## 2021-05-16 DIAGNOSIS — D6869 Other thrombophilia: Secondary | ICD-10-CM | POA: Diagnosis not present

## 2021-05-16 DIAGNOSIS — R030 Elevated blood-pressure reading, without diagnosis of hypertension: Secondary | ICD-10-CM | POA: Diagnosis not present

## 2021-05-16 DIAGNOSIS — Z823 Family history of stroke: Secondary | ICD-10-CM | POA: Diagnosis not present

## 2021-05-16 DIAGNOSIS — N529 Male erectile dysfunction, unspecified: Secondary | ICD-10-CM | POA: Diagnosis not present

## 2021-05-16 DIAGNOSIS — Z7901 Long term (current) use of anticoagulants: Secondary | ICD-10-CM | POA: Diagnosis not present

## 2021-05-23 DIAGNOSIS — H5210 Myopia, unspecified eye: Secondary | ICD-10-CM | POA: Diagnosis not present

## 2021-05-25 DIAGNOSIS — Z0100 Encounter for examination of eyes and vision without abnormal findings: Secondary | ICD-10-CM | POA: Diagnosis not present

## 2021-08-20 DIAGNOSIS — D225 Melanocytic nevi of trunk: Secondary | ICD-10-CM | POA: Diagnosis not present

## 2021-08-20 DIAGNOSIS — X32XXXD Exposure to sunlight, subsequent encounter: Secondary | ICD-10-CM | POA: Diagnosis not present

## 2021-08-20 DIAGNOSIS — Z1283 Encounter for screening for malignant neoplasm of skin: Secondary | ICD-10-CM | POA: Diagnosis not present

## 2021-08-20 DIAGNOSIS — L570 Actinic keratosis: Secondary | ICD-10-CM | POA: Diagnosis not present

## 2021-08-21 DIAGNOSIS — J069 Acute upper respiratory infection, unspecified: Secondary | ICD-10-CM | POA: Diagnosis not present

## 2021-08-26 ENCOUNTER — Other Ambulatory Visit: Payer: Self-pay | Admitting: Internal Medicine

## 2021-08-27 NOTE — Telephone Encounter (Signed)
Eliquis 5 mg refill request received. Patient is 75 years old, weight- 83.2 kg, Crea-1.14 on 10/24/20, Diagnosis- PAF, and last seen by Dr. Lovena Le on 03/13/21. Dose is appropriate based on dosing criteria. Will send in refill to requested pharmacy.

## 2021-10-03 DIAGNOSIS — I4891 Unspecified atrial fibrillation: Secondary | ICD-10-CM | POA: Diagnosis not present

## 2021-10-03 DIAGNOSIS — Z7901 Long term (current) use of anticoagulants: Secondary | ICD-10-CM | POA: Diagnosis not present

## 2021-10-03 DIAGNOSIS — D6869 Other thrombophilia: Secondary | ICD-10-CM | POA: Diagnosis not present

## 2021-10-03 DIAGNOSIS — K5900 Constipation, unspecified: Secondary | ICD-10-CM | POA: Diagnosis not present

## 2021-10-30 DIAGNOSIS — R7303 Prediabetes: Secondary | ICD-10-CM | POA: Diagnosis not present

## 2021-11-01 DIAGNOSIS — M67432 Ganglion, left wrist: Secondary | ICD-10-CM | POA: Diagnosis not present

## 2021-11-01 DIAGNOSIS — R7301 Impaired fasting glucose: Secondary | ICD-10-CM | POA: Diagnosis not present

## 2021-11-01 DIAGNOSIS — Z0001 Encounter for general adult medical examination with abnormal findings: Secondary | ICD-10-CM | POA: Diagnosis not present

## 2021-11-01 DIAGNOSIS — F5221 Male erectile disorder: Secondary | ICD-10-CM | POA: Diagnosis not present

## 2021-11-01 DIAGNOSIS — Z86010 Personal history of colonic polyps: Secondary | ICD-10-CM | POA: Diagnosis not present

## 2021-11-01 DIAGNOSIS — Z8616 Personal history of COVID-19: Secondary | ICD-10-CM | POA: Diagnosis not present

## 2021-11-01 DIAGNOSIS — I480 Paroxysmal atrial fibrillation: Secondary | ICD-10-CM | POA: Diagnosis not present

## 2021-11-01 DIAGNOSIS — Z125 Encounter for screening for malignant neoplasm of prostate: Secondary | ICD-10-CM | POA: Diagnosis not present

## 2021-11-01 DIAGNOSIS — D696 Thrombocytopenia, unspecified: Secondary | ICD-10-CM | POA: Diagnosis not present

## 2021-11-01 DIAGNOSIS — K5900 Constipation, unspecified: Secondary | ICD-10-CM | POA: Diagnosis not present

## 2021-11-01 DIAGNOSIS — K219 Gastro-esophageal reflux disease without esophagitis: Secondary | ICD-10-CM | POA: Diagnosis not present

## 2021-11-01 DIAGNOSIS — M5136 Other intervertebral disc degeneration, lumbar region: Secondary | ICD-10-CM | POA: Diagnosis not present

## 2021-12-13 ENCOUNTER — Other Ambulatory Visit: Payer: Self-pay | Admitting: Internal Medicine

## 2021-12-26 DIAGNOSIS — D225 Melanocytic nevi of trunk: Secondary | ICD-10-CM | POA: Diagnosis not present

## 2021-12-26 DIAGNOSIS — L570 Actinic keratosis: Secondary | ICD-10-CM | POA: Diagnosis not present

## 2021-12-26 DIAGNOSIS — X32XXXD Exposure to sunlight, subsequent encounter: Secondary | ICD-10-CM | POA: Diagnosis not present

## 2021-12-26 DIAGNOSIS — B078 Other viral warts: Secondary | ICD-10-CM | POA: Diagnosis not present

## 2021-12-26 DIAGNOSIS — I872 Venous insufficiency (chronic) (peripheral): Secondary | ICD-10-CM | POA: Diagnosis not present

## 2021-12-26 DIAGNOSIS — Z1283 Encounter for screening for malignant neoplasm of skin: Secondary | ICD-10-CM | POA: Diagnosis not present

## 2022-01-24 DIAGNOSIS — I872 Venous insufficiency (chronic) (peripheral): Secondary | ICD-10-CM | POA: Diagnosis not present

## 2022-03-04 DIAGNOSIS — J069 Acute upper respiratory infection, unspecified: Secondary | ICD-10-CM | POA: Diagnosis not present

## 2022-03-08 ENCOUNTER — Other Ambulatory Visit: Payer: Self-pay | Admitting: Internal Medicine

## 2022-03-10 ENCOUNTER — Emergency Department (HOSPITAL_COMMUNITY): Payer: PPO

## 2022-03-10 ENCOUNTER — Observation Stay (HOSPITAL_COMMUNITY)
Admission: EM | Admit: 2022-03-10 | Discharge: 2022-03-11 | Disposition: A | Discharge status: Home / Self Care | Payer: PPO | Attending: Family Medicine | Admitting: Family Medicine

## 2022-03-10 ENCOUNTER — Encounter (HOSPITAL_COMMUNITY): Payer: Self-pay | Admitting: Emergency Medicine

## 2022-03-10 ENCOUNTER — Other Ambulatory Visit: Payer: Self-pay

## 2022-03-10 DIAGNOSIS — J189 Pneumonia, unspecified organism: Secondary | ICD-10-CM | POA: Diagnosis not present

## 2022-03-10 DIAGNOSIS — I480 Paroxysmal atrial fibrillation: Secondary | ICD-10-CM | POA: Diagnosis present

## 2022-03-10 DIAGNOSIS — D649 Anemia, unspecified: Secondary | ICD-10-CM | POA: Diagnosis not present

## 2022-03-10 DIAGNOSIS — E8809 Other disorders of plasma-protein metabolism, not elsewhere classified: Secondary | ICD-10-CM | POA: Diagnosis present

## 2022-03-10 DIAGNOSIS — R918 Other nonspecific abnormal finding of lung field: Secondary | ICD-10-CM | POA: Diagnosis not present

## 2022-03-10 DIAGNOSIS — R0902 Hypoxemia: Secondary | ICD-10-CM | POA: Diagnosis not present

## 2022-03-10 DIAGNOSIS — A419 Sepsis, unspecified organism: Secondary | ICD-10-CM | POA: Diagnosis not present

## 2022-03-10 DIAGNOSIS — R770 Abnormality of albumin: Secondary | ICD-10-CM | POA: Insufficient documentation

## 2022-03-10 DIAGNOSIS — Z7901 Long term (current) use of anticoagulants: Secondary | ICD-10-CM | POA: Diagnosis not present

## 2022-03-10 DIAGNOSIS — Z20822 Contact with and (suspected) exposure to covid-19: Secondary | ICD-10-CM | POA: Insufficient documentation

## 2022-03-10 DIAGNOSIS — R52 Pain, unspecified: Secondary | ICD-10-CM | POA: Diagnosis not present

## 2022-03-10 DIAGNOSIS — R0602 Shortness of breath: Secondary | ICD-10-CM | POA: Diagnosis not present

## 2022-03-10 DIAGNOSIS — J180 Bronchopneumonia, unspecified organism: Secondary | ICD-10-CM | POA: Diagnosis not present

## 2022-03-10 DIAGNOSIS — R0689 Other abnormalities of breathing: Secondary | ICD-10-CM | POA: Diagnosis not present

## 2022-03-10 LAB — URINALYSIS, ROUTINE W REFLEX MICROSCOPIC
Bilirubin Urine: NEGATIVE
Glucose, UA: NEGATIVE mg/dL
Hgb urine dipstick: NEGATIVE
Ketones, ur: NEGATIVE mg/dL
Leukocytes,Ua: NEGATIVE
Nitrite: NEGATIVE
Protein, ur: NEGATIVE mg/dL
Specific Gravity, Urine: 1.011 (ref 1.005–1.030)
pH: 6 (ref 5.0–8.0)

## 2022-03-10 LAB — RESP PANEL BY RT-PCR (FLU A&B, COVID) ARPGX2
Influenza A by PCR: NEGATIVE
Influenza B by PCR: NEGATIVE
SARS Coronavirus 2 by RT PCR: NEGATIVE

## 2022-03-10 LAB — CBC WITH DIFFERENTIAL/PLATELET
Abs Immature Granulocytes: 0.06 10*3/uL (ref 0.00–0.07)
Basophils Absolute: 0 10*3/uL (ref 0.0–0.1)
Basophils Relative: 0 %
Eosinophils Absolute: 0 10*3/uL (ref 0.0–0.5)
Eosinophils Relative: 0 %
HCT: 37.2 % — ABNORMAL LOW (ref 39.0–52.0)
Hemoglobin: 12.1 g/dL — ABNORMAL LOW (ref 13.0–17.0)
Immature Granulocytes: 0 %
Lymphocytes Relative: 15 %
Lymphs Abs: 2.1 10*3/uL (ref 0.7–4.0)
MCH: 31.8 pg (ref 26.0–34.0)
MCHC: 32.5 g/dL (ref 30.0–36.0)
MCV: 97.6 fL (ref 80.0–100.0)
Monocytes Absolute: 1 10*3/uL (ref 0.1–1.0)
Monocytes Relative: 7 %
Neutro Abs: 11.2 10*3/uL — ABNORMAL HIGH (ref 1.7–7.7)
Neutrophils Relative %: 78 %
Platelets: 195 10*3/uL (ref 150–400)
RBC: 3.81 MIL/uL — ABNORMAL LOW (ref 4.22–5.81)
RDW: 13.4 % (ref 11.5–15.5)
WBC: 14.4 10*3/uL — ABNORMAL HIGH (ref 4.0–10.5)
nRBC: 0 % (ref 0.0–0.2)

## 2022-03-10 LAB — COMPREHENSIVE METABOLIC PANEL
ALT: 16 U/L (ref 0–44)
AST: 17 U/L (ref 15–41)
Albumin: 3.2 g/dL — ABNORMAL LOW (ref 3.5–5.0)
Alkaline Phosphatase: 44 U/L (ref 38–126)
Anion gap: 5 (ref 5–15)
BUN: 19 mg/dL (ref 8–23)
CO2: 25 mmol/L (ref 22–32)
Calcium: 8.4 mg/dL — ABNORMAL LOW (ref 8.9–10.3)
Chloride: 108 mmol/L (ref 98–111)
Creatinine, Ser: 0.98 mg/dL (ref 0.61–1.24)
GFR, Estimated: >60 mL/min (ref >60)
Glucose, Bld: 109 mg/dL — ABNORMAL HIGH (ref 70–99)
Potassium: 4 mmol/L (ref 3.5–5.1)
Sodium: 138 mmol/L (ref 135–145)
Total Bilirubin: 0.8 mg/dL (ref 0.3–1.2)
Total Protein: 6.3 g/dL — ABNORMAL LOW (ref 6.5–8.1)

## 2022-03-10 LAB — PROTIME-INR
INR: 1.2 (ref 0.8–1.2)
Prothrombin Time: 14.9 seconds (ref 11.4–15.2)

## 2022-03-10 LAB — LACTIC ACID, PLASMA
Lactic Acid, Venous: 1.6 mmol/L (ref 0.5–1.9)
Lactic Acid, Venous: 1 mmol/L (ref 0.5–1.9)

## 2022-03-10 LAB — PROCALCITONIN: Procalcitonin: <0.1 ng/mL

## 2022-03-10 MED ORDER — ACETAMINOPHEN 500 MG PO TABS
1000.0000 mg | ORAL_TABLET | Freq: Once | ORAL | Status: AC
  Administered 2022-03-10: 1000 mg via ORAL
  Filled 2022-03-10: qty 2

## 2022-03-10 MED ORDER — BENZONATATE 100 MG PO CAPS
200.0000 mg | ORAL_CAPSULE | Freq: Two times a day (BID) | ORAL | Status: DC
  Administered 2022-03-10 – 2022-03-11 (×2): 200 mg via ORAL
  Filled 2022-03-10 (×2): qty 2

## 2022-03-10 MED ORDER — SODIUM CHLORIDE 0.9% FLUSH
3.0000 mL | Freq: Two times a day (BID) | INTRAVENOUS | Status: DC
  Administered 2022-03-10 – 2022-03-11 (×2): 3 mL via INTRAVENOUS

## 2022-03-10 MED ORDER — FLECAINIDE ACETATE 50 MG PO TABS
100.0000 mg | ORAL_TABLET | Freq: Every day | ORAL | Status: DC
  Administered 2022-03-11: 100 mg via ORAL
  Filled 2022-03-10: qty 2

## 2022-03-10 MED ORDER — SODIUM CHLORIDE 0.9 % IV SOLN
1.0000 g | Freq: Once | INTRAVENOUS | Status: AC
  Administered 2022-03-10: 1 g via INTRAVENOUS
  Filled 2022-03-10: qty 10

## 2022-03-10 MED ORDER — ACETAMINOPHEN 325 MG PO TABS
650.0000 mg | ORAL_TABLET | Freq: Four times a day (QID) | ORAL | Status: DC | PRN
  Administered 2022-03-11: 650 mg via ORAL
  Filled 2022-03-10: qty 2

## 2022-03-10 MED ORDER — SODIUM CHLORIDE 0.9 % IV SOLN
1.0000 g | INTRAVENOUS | Status: DC
  Administered 2022-03-11: 1 g via INTRAVENOUS
  Filled 2022-03-10: qty 10

## 2022-03-10 MED ORDER — APIXABAN 5 MG PO TABS
5.0000 mg | ORAL_TABLET | Freq: Two times a day (BID) | ORAL | Status: DC
  Administered 2022-03-10 – 2022-03-11 (×2): 5 mg via ORAL
  Filled 2022-03-10 (×2): qty 1

## 2022-03-10 MED ORDER — SODIUM CHLORIDE 0.9 % IV BOLUS
1000.0000 mL | Freq: Once | INTRAVENOUS | Status: AC
  Administered 2022-03-10: 1000 mL via INTRAVENOUS

## 2022-03-10 MED ORDER — GUAIFENESIN-DM 100-10 MG/5ML PO SYRP
10.0000 mL | ORAL_SOLUTION | ORAL | Status: DC | PRN
  Administered 2022-03-11: 10 mL via ORAL
  Filled 2022-03-10: qty 10

## 2022-03-10 MED ORDER — ACETAMINOPHEN 650 MG RE SUPP: 650.0000 mg | Freq: Four times a day (QID) | RECTAL | Status: DC | PRN

## 2022-03-10 MED ORDER — SODIUM CHLORIDE 0.9 % IV SOLN
500.0000 mg | INTRAVENOUS | Status: DC
  Administered 2022-03-10 – 2022-03-11 (×2): 500 mg via INTRAVENOUS
  Filled 2022-03-10 (×2): qty 5

## 2022-03-10 NOTE — H&P (Signed)
History and Physical    Patient: Greg Mann RKY:[id number] DOB: [birth year] DOA: 03/10/2022 DOS: the patient was seen and examined on 03/10/2022 PCP: Celene Squibb, MD  Patient coming from: Home  Chief Complaint:  Chief Complaint  Patient presents with   Shortness of Breath   Cough   HPI: Greg Mann is a 76 y.o. male with a history of PAF on eliquis and flecainide who presented to the [facility] 7/2 due to progressive cough and shortness of breath with fever. Symptoms began a couple weeks ago during a visit to family in [place]. His 92-monthold granddaughter had URI symptoms which his wife, then he, began having as well. Initially he had cough and malaise. With no improvement, he had a televisit on 6/26 where he was prescribed augmentin and a prednisone taper which he's been taking with no significant improvement. They drove back home. Last night he had a low fever and coughing was severe. When home pulse oximetry was down to 91-92%, his wife made him come in today. He denies wheezing, chest pain, palpitations (can tell when he's in AFib and hasn't felt that way), rash, diarrhea, leg swelling. Hasn't missed doses of medications.    In the ED he was febrile to 100.31F, tachypneic with normal HR and BP, maintaining marginal oxygen saturations on room air. CXR demonstrated streaky RLL opacity. WBC 14.4k, lactic acid 1.6. Ceftriaxone and azithromycin were given and patient admitted for failure of outpatient management of pneumonia.  Review of Systems: As mentioned in the history of present illness. All other systems reviewed and are negative. Past Medical History:  Diagnosis Date   Borderline hyperlipidemia    Chest pain    Dysrhythmia    Exercise-induced tachycardia    Ganglion    left wrist   GERD (gastroesophageal reflux disease)    Thrombocytopenia (HCC)    borderline   Past Surgical History:  Procedure Laterality Date   COLONOSCOPY N/A 04/22/2018   Procedure: COLONOSCOPY;  Surgeon:  RDaneil Dolin MD;  Location: [facility];  Service: Endoscopy;  Laterality: N/A;  8:30   GANGLION CYST EXCISION     Left Wrist   KNEE ARTHROSCOPY     Left   TONSILLECTOMY     Social History:  reports that he has never smoked. He has never used smokeless tobacco. He reports current alcohol use. He reports that he does not use drugs.  No Known Allergies  Family History  Problem Relation Age of Onset   Aneurysm Father        Aortic   Coronary artery disease Brother        PCI    Prior to Admission medications   Medication Sig Start Date End Date Taking? Authorizing Provider  amoxicillin-clavulanate (AUGMENTIN) 875-125 MG tablet Take 1 tablet by mouth 2 (two) times daily. For 7 days 03/04/22  Yes [provider]  apixaban (ELIQUIS) 5 MG TABS tablet TAKE 1 TABLET BY MOUTH TWICE A DAY 08/27/21  Yes TEvans Lance MD  augmented betamethasone dipropionate (DIPROLENE-AF) 0.05 % cream Apply topically 2 (two) times daily as needed. 01/16/22  Yes [provider]  benzonatate (TESSALON) 200 MG capsule Take 200 mg by mouth 2 (two) times daily.   Yes [provider]  Cholecalciferol (VITAMIN D3 EXTRA STRENGTH PO) Take by mouth.   Yes [provider]  flecainide (TAMBOCOR) 100 MG tablet TAKE 1 TABLET BY MOUTH EVERY DAY 03/08/22  Yes TEvans Lance MD  HYDROcodone bit-homatropine ([facility] 5-1.5  MG/5ML syrup Take 5 mLs by mouth. 03/04/22  Yes [provider]  Multiple Vitamin (MULTIVITAMIN) tablet Take 1 tablet by mouth daily.   Yes [provider]  predniSONE (STERAPRED UNI-PAK 21 TAB) 10 MG (21) TBPK tablet Take by mouth. 03/04/22  Yes [provider]  sildenafil (VIAGRA) 100 MG tablet Take 100 mg by mouth as needed for erectile dysfunction.   Yes [provider]    Physical Exam: Vitals:   03/10/22 1423 03/10/22 1502 03/10/22 1544 03/10/22 1600  BP:  117/63 (!) 117/56 116/66  Pulse:  (!) 56 (!) 58 (!) 59  Resp:  20 14  (!) 25  Temp: (!) 100.8 F (38.2 C)     TempSrc: Rectal     SpO2:  96% 96% 95%  Weight:      Height:      Gen: 76 y.o. male in no distress Pulm: Nonlabored breathing room air, tachypneic with rhonchi in R > L lung fields. No wheezes. CV: Regular rate and rhythm. No murmur, rub, or gallop. No JVD, no dependent edema. GI: Abdomen soft, non-tender, non-distended, with normoactive bowel sounds.  Ext: Warm, no deformities Skin: No rashes, lesions or ulcers on visualized skin. Neuro: Alert and oriented. No focal neurological deficits. Psych: Judgement and insight appear fair. Mood euthymic & affect congruent. Behavior is appropriate.    Data Reviewed: WBC 14.4k (11.2k PMNs) Hgb 12.1 g/dl, normal indices Plt 195k SCr 0.98, BUN 19, Albumin 3.2 ECG: NSR w/single PAC, rate 63bpm, QTc 427mec. No AFib or flutter. CXR: RLL streaky opacity consistent with bronchopneumonia (agree w/radiologist interpretation)  Assessment and Plan: RLL bronchopneumonia: Failed outpatient management with augmentin > prednisone. No hx COPD/asthma/tobacco use. He was on recent long trip, though exam more consistent with infection than VTE w/adherence to eliquis.  - Continue ceftriaxone, azithromycin - Unclear why he would fail augmentin, ?viral PNA. Covid and flu negative. Check full viral panel, PCT. - Add strep and legionella urine Ag's, sputum Cx, monitor blood cultures - Leukocytosis may be from steroid vs. infection. - Maximize antitussives  PAF: Currently in sinus rhythm.  - Continue eliquis and flecainide.   Hypoalbuminemia:  - Suggest PCP follow up  Normocytic anemia: Very mild.  - Suggest recheck at follow up.    Advance Care Planning: Full code confirmed with patient   Consults: None  Family Communication: Spouse at bedside  Severity of Illness: The appropriate patient status for this patient is OBSERVATION. Observation status is judged to be reasonable and necessary in order to provide the  required intensity of service to ensure the patient's safety. The patient's presenting symptoms, physical exam findings, and initial radiographic and laboratory data in the context of their medical condition is felt to place them at decreased risk for further clinical deterioration. Furthermore, it is anticipated that the patient will be medically stable for discharge from the hospital within 2 midnights of admission.   Author: RPatrecia Pour MD 03/10/2022 5:10 PM  For on call review [email or web address]

## 2022-03-10 NOTE — ED Triage Notes (Signed)
Pt went to [place] and was with family members who wer sick. Pt has been on antibiotics for cough but getting worse today. Pt febrile today and unable to cough up. Pt SOB and worse on exertion. Denies any pain.

## 2022-03-10 NOTE — ED Provider Notes (Signed)
[facility] EMERGENCY DEPARTMENT Provider Note   CSN: [id number] Arrival date & time: 03/10/22  1337     History Chief Complaint  Patient presents with   Shortness of Breath   Cough    Greg Mann is a 76 y.o. male with history of GERD, hyperlipidemia, atrial fibrillation on flecainide and Eliquis who presents to the emergency department today with shortness of breath and worsening cough this been ongoing for the last couple of weeks.  Patient was visiting some family down in [place] and his granddaughter initially got his wife sick.  She was successfully treated with antibiotics but the patient began feeling under the weather shortly afterward.  Really over the last week or so patient has been feeling worse despite being on Augmentin.  His shortness of breath has been worsening with exertion in addition to his cough.  He reports associated fevers up to 102 at home.  He denies abdominal pain, nausea, vomiting, diarrhea.   Shortness of Breath Associated symptoms: cough   Cough Associated symptoms: shortness of breath        Home Medications Prior to Admission medications   Medication Sig Start Date End Date Taking? Authorizing Provider  amoxicillin-clavulanate (AUGMENTIN) 875-125 MG tablet Take 1 tablet by mouth 2 (two) times daily. For 7 days 03/04/22  Yes [provider]  apixaban (ELIQUIS) 5 MG TABS tablet TAKE 1 TABLET BY MOUTH TWICE A DAY 08/27/21  Yes Evans Lance, MD  augmented betamethasone dipropionate (DIPROLENE-AF) 0.05 % cream Apply topically 2 (two) times daily as needed. 01/16/22  Yes [provider]  benzonatate (TESSALON) 200 MG capsule Take 200 mg by mouth 2 (two) times daily.   Yes [provider]  Cholecalciferol (VITAMIN D3 EXTRA STRENGTH PO) Take by mouth.   Yes [provider]  flecainide (TAMBOCOR) 100 MG tablet TAKE 1 TABLET BY MOUTH EVERY DAY 03/08/22  Yes Evans Lance, MD  HYDROcodone bit-homatropine [facility]) 5-1.5  MG/5ML syrup Take 5 mLs by mouth. 03/04/22  Yes [provider]  Multiple Vitamin (MULTIVITAMIN) tablet Take 1 tablet by mouth daily.   Yes [provider]  predniSONE (STERAPRED UNI-PAK 21 TAB) 10 MG (21) TBPK tablet Take by mouth. 03/04/22  Yes [provider]  sildenafil (VIAGRA) 100 MG tablet Take 100 mg by mouth as needed for erectile dysfunction.   Yes [provider]      Allergies    Patient has no known allergies.    Review of Systems   Review of Systems  Respiratory:  Positive for cough and shortness of breath.   All other systems reviewed and are negative.   Physical Exam Updated Vital Signs BP 116/66   Pulse (!) 59   Temp (!) 100.8 F (38.2 C) (Rectal)   Resp (!) 25   Ht '5\' 11"'$  (1.803 m)   Wt 79.4 kg   SpO2 95%   BMI 24.41 kg/m  Physical Exam Vitals and nursing note reviewed.  Constitutional:      General: He is in acute distress.     Appearance: Normal appearance.  HENT:     Head: Normocephalic and atraumatic.  Eyes:     General:        Right eye: No discharge.        Left eye: No discharge.  Cardiovascular:     Comments: Regular rate and rhythm.  S1/S2 are distinct without any evidence of murmur, rubs, or gallops.  Radial pulses are 2+ bilaterally.  Dorsalis pedis  pulses are 2+ bilaterally.  No evidence of pedal edema. Pulmonary:     Effort: Tachypnea present.     Breath sounds: Wheezing and rhonchi present.  Abdominal:     General: Abdomen is flat. Bowel sounds are normal. There is no distension.     Tenderness: There is no abdominal tenderness. There is no guarding or rebound.  Musculoskeletal:        General: Normal range of motion.     Cervical back: Neck supple.  Skin:    General: Skin is warm and dry.     Findings: No rash.  Neurological:     General: No focal deficit present.     Mental Status: He is alert.  Psychiatric:        Mood and Affect: Mood normal.        Behavior: Behavior normal.     ED  Results / Procedures / Treatments   Labs (all labs ordered are listed, but only abnormal results are displayed) Labs Reviewed  COMPREHENSIVE METABOLIC PANEL - Abnormal; Notable for the following components:      Result Value   Glucose, Bld 109 (*)    Calcium 8.4 (*)    Total Protein 6.3 (*)    Albumin 3.2 (*)    All other components within normal limits  CBC WITH DIFFERENTIAL/PLATELET - Abnormal; Notable for the following components:   WBC 14.4 (*)    RBC 3.81 (*)    Hemoglobin 12.1 (*)    HCT 37.2 (*)    Neutro Abs 11.2 (*)    All other components within normal limits  CULTURE, BLOOD (ROUTINE X 2)  CULTURE, BLOOD (ROUTINE X 2)  RESP PANEL BY RT-PCR (FLU A&B, COVID) ARPGX2  RESPIRATORY PANEL BY PCR  LACTIC ACID, PLASMA  LACTIC ACID, PLASMA  PROTIME-INR  URINALYSIS, ROUTINE W REFLEX MICROSCOPIC    EKG EKG Interpretation  Date/Time:  Sunday March 10 2022 13:49:19 EDT Ventricular Rate:  63 PR Interval:  181 QRS Duration: 119 QT Interval:  456 QTC Calculation: 467 R Axis:   93 Text Interpretation: Sinus rhythm Atrial premature complex Left atrial enlargement Nonspecific intraventricular conduction delay No significant change since last tracing Confirmed by Isla Pence [phone]) on 03/10/2022 3:18:34 PM  Radiology DG Chest 2 View  Result Date: 03/10/2022 CLINICAL DATA:  Productive cough and shortness of breath for over a week. EXAM: CHEST - 2 VIEW COMPARISON:  11/02/2012. FINDINGS: Streaky opacities noted in the right lower lobe at the medial lung base. Remainder of the lungs is clear. Cardiac silhouette is normal in size. Normal mediastinal and hilar contours. No pleural effusion or pneumothorax. Skeletal structures are intact. IMPRESSION: 1. Streaky type opacity in the medial right lower lobe suspicious for bronchopneumonia. 2. No other evidence of acute cardiopulmonary disease. Electronically Signed   By: Lajean Manes M.D.   On: 03/10/2022 14:23    Procedures .Critical  Care  Performed by: Hendricks Limes, PA-C Authorized by: Hendricks Limes, PA-C   Critical care provider statement:    Critical care time (minutes):  40   Critical care time was exclusive of:  Separately billable procedures and treating other patients   Critical care was necessary to treat or prevent imminent or life-threatening deterioration of the following conditions:  Sepsis and respiratory failure   Critical care was time spent personally by me on the following activities:  Blood draw for specimens, development of treatment plan with patient or surrogate, discussions with consultants, pulse oximetry and re-evaluation of  patient's condition   I assumed direction of critical care for this patient from another provider in my specialty: no     Care discussed with: admitting provider       Medications Ordered in ED Medications  azithromycin (ZITHROMAX) 500 mg in sodium chloride 0.9 % 250 mL IVPB (500 mg Intravenous New Bag/Given 03/10/22 1638)  cefTRIAXone (ROCEPHIN) 1 g in sodium chloride 0.9 % 100 mL IVPB (has no administration in time range)  sodium chloride 0.9 % bolus 1,000 mL (1,000 mLs Intravenous New Bag/Given 03/10/22 1540)  acetaminophen (TYLENOL) tablet 1,000 mg (1,000 mg Oral Given 03/10/22 1542)  cefTRIAXone (ROCEPHIN) 1 g in sodium chloride 0.9 % 100 mL IVPB (0 g Intravenous Stopped 03/10/22 1653)    ED Course/ Medical Decision Making/ A&P Clinical Course as of 03/10/22 1653  Sun Mar 10, 2022  1643 CBC with Differential(!) There is evidence of leukocytosis and anemia. [CF]  1646 Urinalysis, Routine w reflex microscopic Urine, Clean Catch Urinalysis is normal. [CF]  1646 Comprehensive metabolic panel(!) No significant abnormalities. [CF]  1646 Protime-INR Normal [CF]  1646 Lactic acid, plasma Normal. [CF]  1646 Culture, blood (Routine x 2) Blood cultures obtained and pending. [CF]  1610 DG Chest 2 View I personally ordered and interpreted this image I do see evidence of  pneumonia in the right lobe.  I do agree with the radiologist interpretation. [CF]  1649 I spoke Dr. Bonner Puna with [facility] hospitalist who agrees to admit the patient. [CF]    Clinical Course User Index [CF] Hendricks Limes, PA-C                           Medical Decision Making Greg Mann is a 76 y.o. male patient who presents to the emergency department with shortness of breath and cough.  Patient is febrile and tachypneic here.  Patient did have some low O2 sats with EMS.  He is oxygenating well on room air today here despite being tachypneic and febrile.  I am going to get undifferentiated sepsis labs for presumed pneumonia in addition to a chest x-ray.   Amount and/or Complexity of Data Reviewed Labs: ordered. Decision-making details documented in ED Course. Radiology: ordered. Decision-making details documented in ED Course.  Risk OTC drugs. Parenteral controlled substances. Decision regarding hospitalization. Risk Details: Patient has evidence of sepsis secondary to pneumonia.  Patient does not show any evidence of hypoxia and he has been satting well on room air.  There is evidence of tachypnea.  Given the findings on imaging and labs I do feel the patient would likely benefit from further evaluation in the hospital for further evaluation.  I notified the patient and family of all lab and imaging results.  They are amenable to staying here in the hospital.  I Georgina Peer work on getting him admitted with the hospitalist service.   Final Clinical Impression(s) / ED Diagnoses Final diagnoses:  Sepsis without acute organ dysfunction, due to unspecified organism [facility])  Community acquired pneumonia of right lung, unspecified part of lung    Rx / [place] Orders ED Discharge Orders     None         Hendricks Limes, [place] 03/10/22 1653    Varney Biles, MD 03/11/22 [phone]

## 2022-03-11 DIAGNOSIS — J189 Pneumonia, unspecified organism: Secondary | ICD-10-CM | POA: Diagnosis not present

## 2022-03-11 DIAGNOSIS — I480 Paroxysmal atrial fibrillation: Secondary | ICD-10-CM | POA: Diagnosis not present

## 2022-03-11 LAB — D-DIMER, QUANTITATIVE: D-Dimer, Quant: 0.33 ug/mL-FEU (ref 0.00–0.50)

## 2022-03-11 LAB — STREP PNEUMONIAE URINARY ANTIGEN: Strep Pneumo Urinary Antigen: NEGATIVE

## 2022-03-11 LAB — HIV ANTIBODY (ROUTINE TESTING W REFLEX): HIV Screen 4th Generation wRfx: NONREACTIVE

## 2022-03-11 MED ORDER — PREDNISONE 20 MG PO TABS
40.0000 mg | ORAL_TABLET | Freq: Every day | ORAL | 0 refills | Status: AC
Start: 2022-03-11 — End: 2022-03-16

## 2022-03-11 MED ORDER — AZITHROMYCIN 500 MG PO TABS: 500.0000 mg | ORAL_TABLET | Freq: Every day | ORAL | 0 refills | Status: AC

## 2022-03-11 MED ORDER — HYDROCODONE BIT-HOMATROP MBR 5-1.5 MG/5ML PO SOLN
5.0000 mL | Freq: Four times a day (QID) | ORAL | 0 refills | Status: DC | PRN
Start: 2022-03-11 — End: 2024-07-31

## 2022-03-11 MED ORDER — ALBUTEROL SULFATE HFA 108 (90 BASE) MCG/ACT IN AERS: 2.0000 | INHALATION_SPRAY | Freq: Four times a day (QID) | RESPIRATORY_TRACT | 0 refills | Status: DC | PRN

## 2022-03-11 MED ORDER — BENZONATATE 200 MG PO CAPS: 200.0000 mg | ORAL_CAPSULE | Freq: Three times a day (TID) | ORAL | 0 refills | Status: DC | PRN

## 2022-03-11 MED ORDER — CEFDINIR 300 MG PO CAPS: 300.0000 mg | ORAL_CAPSULE | Freq: Two times a day (BID) | ORAL | 0 refills | Status: AC

## 2022-03-11 NOTE — TOC Progression Note (Signed)
Transition of Care [facility]) - Progression Note    Patient Details  Name: Greg Mann MRN: [id number] Date of Birth: 11-Apr-1946  Transition of Care [facility]) CM/SW Contact  Salome Arnt, [place] Phone Number: 03/11/2022, 8:19 AM  Clinical Narrative:  Transition of Care [facility]) Screening Note   Patient Details  Name: Greg Mann Date of Birth: Apr 04, 1946   Transition of Care [facility]) CM/SW Contact:    Salome Arnt, [place] Phone Number: 03/11/2022, 8:19 AM    Transition of Care Department [facility]) has reviewed patient and no TOC needs have been identified at this time. We will continue to monitor patient advancement through interdisciplinary progression rounds. If new patient transition needs arise, please place a TOC consult.          Barriers to Discharge: Barriers Resolved  Expected Discharge Plan and Services           Expected Discharge Date: 03/11/22                                     Social Determinants of Health (SDOH) Interventions    Readmission Risk Interventions     No data to display

## 2022-03-11 NOTE — Progress Notes (Signed)
Patients oxygen saturation sitting off side of bed was 95% Room Air, patient ambulated in hallway oxygen saturation was 93% Room Air. Patient reported no complaints of SOB. MD Courage made aware.

## 2022-03-11 NOTE — Discharge Instructions (Signed)
1) please take antibiotics including Omnicef/cefdinir and azithromycin as prescribed for pneumonia 2) please avoid strenuous activity and avoid dehydration 3) please follow-up with your primary care physician within a week for recheck and reevaluation 4) you are taking Eliquis/apixaban which is a blood thinner so please Avoid ibuprofen/Advil/Aleve/Motrin/Goody Powders/Naproxen/BC powders/Meloxicam/Diclofenac/Indomethacin and other Nonsteroidal anti-inflammatory medications as these will make you more likely to bleed and can cause stomach ulcers, can also cause Kidney problems.   5) repeat CBC and BMP blood test with the primary care physician within a week advised

## 2022-03-11 NOTE — Care Management Obs Status (Signed)
[place] NOTIFICATION   Patient Details  Name: Greg Mann MRN: [id number] Date of Birth: July 18, 1946   Medicare Observation Status Notification Given:  Yes Copy mailed to address on file as requested   Tommy Medal 03/11/2022, 4:19 PM

## 2022-03-11 NOTE — Discharge Summary (Signed)
Greg Mann, is a 76 y.o. male  DOB 06-May-1946  MRN [id number].  Admission date:  03/10/2022  Admitting Physician  Patrecia Pour, MD  Discharge Date:  03/11/2022   Primary MD  Celene Squibb, MD  Recommendations for primary care physician for things to follow:   1) please take antibiotics including Omnicef/cefdinir and azithromycin as prescribed for pneumonia 2) please avoid strenuous activity and avoid dehydration 3) please follow-up with your primary care physician within a week for recheck and reevaluation 4) you are taking Eliquis/apixaban which is a blood thinner so please Avoid ibuprofen/Advil/Aleve/Motrin/Goody Powders/Naproxen/BC powders/Meloxicam/Diclofenac/Indomethacin and other Nonsteroidal anti-inflammatory medications as these will make you more likely to bleed and can cause stomach ulcers, can also cause Kidney problems.   5) repeat CBC and BMP blood test with the primary care physician within a week advised  Admission Diagnosis  RLL pneumonia [J18.9] Community acquired pneumonia of right lung, unspecified part of lung [J18.9] Sepsis without acute organ dysfunction, due to unspecified organism [facility]) [A41.9]   Discharge Diagnosis  RLL pneumonia [J18.9] Community acquired pneumonia of right lung, unspecified part of lung [J18.9] Sepsis without acute organ dysfunction, due to unspecified organism ([place]) [A41.9]    Principal Problem:   RLL pneumonia Active Problems:   Paroxysmal atrial fibrillation (HCC)   Hypoalbuminemia   Normocytic anemia      Past Medical History:  Diagnosis Date   Borderline hyperlipidemia    Chest pain    Dysrhythmia    Exercise-induced tachycardia    Ganglion    left wrist   GERD (gastroesophageal reflux disease)    Thrombocytopenia ([place])    borderline    Past Surgical History:  Procedure Laterality Date   COLONOSCOPY N/A 04/22/2018   Procedure:  COLONOSCOPY;  Surgeon: Daneil Dolin, MD;  Location[facility];  Service: Endoscopy;  Laterality: N/A;  8:30   GANGLION CYST EXCISION     Left Wrist   KNEE ARTHROSCOPY     Left   TONSILLECTOMY         HPI  from the history and physical done on the day of admission:     HPI: Greg Mann is a 76 y.o. male with a history of PAF on eliquis and flecainide who presented to the [facility] 7/2 due to progressive cough and shortness of breath with fever. Symptoms began a couple weeks ago during a visit to family in [place]. His 8-monthold granddaughter had URI symptoms which his wife, then he, began having as well. Initially he had cough and malaise. With no improvement, he had a televisit on 6/26 where he was prescribed augmentin and a prednisone taper which he's been taking with no significant improvement. They drove back home. Last night he had a low fever and coughing was severe. When home pulse oximetry was down to 91-92%, his wife made him come in today. He denies wheezing, chest pain, palpitations (can tell when he's in AFib and hasn't felt that way), rash, diarrhea, leg swelling. Hasn't missed doses of  medications.     In the ED he was febrile to 100.37F, tachypneic with normal HR and BP, maintaining marginal oxygen saturations on room air. CXR demonstrated streaky RLL opacity. WBC 14.4k, lactic acid 1.6. Ceftriaxone and azithromycin were given and patient admitted for failure of outpatient management of pneumonia.   Review of Systems: As mentioned in the history of present illness. All other systems reviewed and are negative.     Hospital Course:   Assessment and Plan: 1)-----Right lower lobe bronchopneumonia-community-acquired pneumonia - Apparently failed Augmentin as outpatient.... Treated with Rocephin/azithromycin.... Clinically improved, no hypoxia.Marland Kitchen okay to discharge on Omnicef/azithromycin -Prednisone  and bronchiodilators as ordered PCT < 0.10 Covid, flu , Legionella and  Strep Pneumonia are all Negative No Hypoxia   2)PAF--Continue flecainide for rhythm control and Eliquis for stroke prophylaxis   Discharge Condition: stable  Follow UP   Follow-up Information     Celene Squibb, MD. Schedule an appointment as soon as possible for a visit in 1 week(s).   Specialty: Internal Medicine Contact information: [address] [place] [address] [phone]         Evans Lance, MD .   Specialty: Cardiology Contact information: [place] [address] [phone]                 Diet and Activity recommendation:  As advised  Discharge Instructions    Discharge Instructions     Call MD for:  difficulty breathing, headache or visual disturbances   Complete by: As directed    Call MD for:  persistant dizziness or light-headedness   Complete by: As directed    Call MD for:  persistant nausea and vomiting   Complete by: As directed    Call MD for:  severe uncontrolled pain   Complete by: As directed    Call MD for:  temperature >100.4   Complete by: As directed    Diet - low sodium heart healthy   Complete by: As directed    Discharge instructions   Complete by: As directed    1) please take antibiotics including Omnicef/cefdinir and azithromycin as prescribed for pneumonia 2) please avoid strenuous activity and avoid dehydration 3) please follow-up with your primary care physician within a week for recheck and reevaluation 4) you are taking Eliquis/apixaban which is a blood thinner so please Avoid ibuprofen/Advil/Aleve/Motrin/Goody Powders/Naproxen/BC powders/Meloxicam/Diclofenac/Indomethacin and other Nonsteroidal anti-inflammatory medications as these will make you more likely to bleed and can cause stomach ulcers, can also cause Kidney problems.   5) repeat CBC and BMP blood test with the primary care physician within a week advised   Increase activity slowly   Complete by: As directed           Discharge Medications     Allergies as of 03/11/2022   No Known Allergies      Medication List     STOP taking these medications    amoxicillin-clavulanate 875-125 MG tablet Commonly known as: AUGMENTIN   predniSONE 10 MG (21) Tbpk tablet Commonly known as: STERAPRED UNI-PAK 21 TAB Replaced by: predniSONE 20 MG tablet       TAKE these medications    albuterol 108 (90 Base) MCG/ACT inhaler Commonly known as: VENTOLIN HFA Inhale 2 puffs into the lungs every 6 (six) hours as needed for wheezing or shortness of breath.   augmented betamethasone dipropionate 0.05 % cream Commonly known as: DIPROLENE-AF Apply topically 2 (two) times daily as needed.   azithromycin  500 MG tablet Commonly known as: ZITHROMAX Take 1 tablet (500 mg total) by mouth daily for 3 days.   benzonatate 200 MG capsule Commonly known as: TESSALON Take 1 capsule (200 mg total) by mouth 3 (three) times daily as needed for cough. What changed:  when to take this reasons to take this   cefdinir 300 MG capsule Commonly known as: OMNICEF Take 1 capsule (300 mg total) by mouth 2 (two) times daily for 5 days.   Eliquis 5 MG Tabs tablet Generic drug: apixaban TAKE 1 TABLET BY MOUTH TWICE A DAY   flecainide 100 MG tablet Commonly known as: TAMBOCOR TAKE 1 TABLET BY MOUTH EVERY DAY   HYDROcodone bit-homatropine 5-1.5 MG/5ML syrup Commonly known as: HYCODAN Take 5 mLs by mouth every 6 (six) hours as needed for cough. What changed:  when to take this reasons to take this   multivitamin tablet Take 1 tablet by mouth daily.   predniSONE 20 MG tablet Commonly known as: DELTASONE Take 2 tablets (40 mg total) by mouth daily with breakfast for 5 days. Replaces: predniSONE 10 MG (21) Tbpk tablet   sildenafil 100 MG tablet Commonly known as: VIAGRA Take 100 mg by mouth as needed for erectile dysfunction.   VITAMIN D3 EXTRA STRENGTH PO Take by mouth.        Major procedures and Radiology  Reports - PLEASE review detailed and final reports for all details, in brief -   DG Chest 2 View  Result Date: 03/10/2022 CLINICAL DATA:  Productive cough and shortness of breath for over a week. EXAM: CHEST - 2 VIEW COMPARISON:  11/02/2012. FINDINGS: Streaky opacities noted in the right lower lobe at the medial lung base. Remainder of the lungs is clear. Cardiac silhouette is normal in size. Normal mediastinal and hilar contours. No pleural effusion or pneumothorax. Skeletal structures are intact. IMPRESSION: 1. Streaky type opacity in the medial right lower lobe suspicious for bronchopneumonia. 2. No other evidence of acute cardiopulmonary disease. Electronically Signed   By: Lajean Manes M.D.   On: 03/10/2022 14:23    Micro Results   Recent Results (from the past 240 hour(s))  Culture, blood (Routine x 2)     Status: None (Preliminary result)   Collection Time: 03/10/22  2:02 PM   Specimen: Right Antecubital; Blood  Result Value Ref Range Status   Specimen Description   Final    RIGHT ANTECUBITAL BOTTLES DRAWN AEROBIC AND ANAEROBIC   Special Requests   Final    Blood Culture results may not be optimal due to an excessive volume of blood received in culture bottles   Culture   Final    NO GROWTH < 12 HOURS Performed at [facility], [address] [place], [place] [address]    Report Status PENDING  Incomplete  Culture, blood (Routine x 2)     Status: None (Preliminary result)   Collection Time: 03/10/22  2:06 PM   Specimen: BLOOD LEFT FOREARM  Result Value Ref Range Status   Specimen Description   Final    BLOOD LEFT FOREARM BOTTLES DRAWN AEROBIC AND ANAEROBIC   Special Requests   Final    Blood Culture results may not be optimal due to an excessive volume of blood received in culture bottles   Culture   Final    NO GROWTH < 12 HOURS Performed at [facility], [address] [place], [place] [address]    Report Status PENDING  Incomplete  Resp Panel by RT-PCR (Flu  A&B, Covid)  Anterior Nasal Swab     Status: None   Collection Time: 03/10/22  3:26 PM   Specimen: Anterior Nasal Swab  Result Value Ref Range Status   SARS Coronavirus 2 by RT PCR NEGATIVE NEGATIVE Final    Comment: (NOTE) SARS-CoV-2 target nucleic acids are NOT DETECTED.  The SARS-CoV-2 RNA is generally detectable in upper respiratory specimens during the acute phase of infection. The lowest concentration of SARS-CoV-2 viral copies this assay can detect is 138 copies/mL. A negative result does not preclude SARS-Cov-2 infection and should not be used as the sole basis for treatment or other patient management decisions. A negative result may occur with  improper specimen collection/handling, submission of specimen other than nasopharyngeal swab, presence of viral mutation(s) within the areas targeted by this assay, and inadequate number of viral copies(<138 copies/mL). A negative result must be combined with clinical observations, patient history, and epidemiological information. The expected result is Negative.  Fact Sheet for Patients:  EntrepreneurPulse.com.au  Fact Sheet for Healthcare Providers:  IncredibleEmployment.be  This test is no t yet approved or cleared by the [place] FDA and  has been authorized for detection and/or diagnosis of SARS-CoV-2 by FDA under an Emergency Use Authorization (EUA). This EUA will remain  in effect (meaning this test can be used) for the duration of the COVID-19 declaration under Section 564(b)(1) of the Act, 21 U.S.C.section 360bbb-3(b)(1), unless the authorization is terminated  or revoked sooner.       Influenza A by PCR NEGATIVE NEGATIVE Final   Influenza B by PCR NEGATIVE NEGATIVE Final    Comment: (NOTE) The Xpert Xpress SARS-CoV-2/FLU/RSV plus assay is intended as an aid in the diagnosis of influenza from Nasopharyngeal swab specimens and should not be used as a sole basis for treatment. Nasal washings  and aspirates are unacceptable for Xpert Xpress SARS-CoV-2/FLU/RSV testing.  Fact Sheet for Patients: EntrepreneurPulse.com.au  Fact Sheet for Healthcare Providers: IncredibleEmployment.be  This test is not yet approved or cleared by the [place] FDA and has been authorized for detection and/or diagnosis of SARS-CoV-2 by FDA under an Emergency Use Authorization (EUA). This EUA will remain in effect (meaning this test can be used) for the duration of the COVID-19 declaration under Section 564(b)(1) of the Act, 21 U.S.C. section 360bbb-3(b)(1), unless the authorization is terminated or revoked.  Performed at [facility], [address], [place], [place] [address]     Today   Subjective    Greg Mann today has no new complaints  No fever  Or chills   No Nausea, Vomiting or Diarrhea  Cough improving         Patient has been seen and examined prior to discharge   Objective   Blood pressure (!) 98/58, pulse (!) 59, temperature 97.8 F (36.6 C), temperature source Oral, resp. rate 20, height '5\' 11"'$  (1.803 m), weight 80.2 kg, SpO2 98 %.   Intake/Output Summary (Last 24 hours) at 03/11/2022 1724 Last data filed at 03/11/2022 1300 Gross per 24 hour  Intake 1850.32 ml  Output --  Net 1850.32 ml    Exam Gen:- Awake Alert, no acute distress  HEENT:- [place].AT, No sclera icterus Neck-Supple Neck,No JVD,.  Lungs-  fair air movement, no wheezing CV- S1, S2 normal, regular Abd-  +ve B.Sounds, Abd Soft, No tenderness,    Extremity/Skin:- No  edema,   good pulses Psych-affect is appropriate, oriented x3 Neuro-no new focal deficits, no tremors    Data Review   CBC  w Diff:  Lab Results  Component Value Date   WBC 14.4 (H) 03/10/2022   HGB 12.1 (L) 03/10/2022   HCT 37.2 (L) 03/10/2022   PLT 195 03/10/2022   LYMPHOPCT 15 03/10/2022   MONOPCT 7 03/10/2022   EOSPCT 0 03/10/2022   BASOPCT 0 03/10/2022    CMP:  Lab Results   Component Value Date   NA 138 03/10/2022   K 4.0 03/10/2022   CL 108 03/10/2022   CO2 25 03/10/2022   BUN 19 03/10/2022   CREATININE 0.98 03/10/2022   CREATININE 0.98 07/15/2013   PROT 6.3 (L) 03/10/2022   ALBUMIN 3.2 (L) 03/10/2022   BILITOT 0.8 03/10/2022   ALKPHOS 44 03/10/2022   AST 17 03/10/2022   ALT 16 03/10/2022  .  Total Discharge time is about 33 minutes  Roxan Hockey M.D on 03/11/2022 at 5:24 PM  Go to [email or web address] -  for contact info  [facility] - Office  [phone]

## 2022-03-11 NOTE — Progress Notes (Signed)
Pt discharged to home.  LDAs previously removed, AVS reviewed and copy provided, pt verbalized understanding.  All personal belongings returned.  Pt taken to entrance via wheelchair and will be driven home by wife.

## 2022-03-12 LAB — LEGIONELLA PNEUMOPHILA SEROGP 1 UR AG: L. pneumophila Serogp 1 Ur Ag: NEGATIVE

## 2022-03-14 DIAGNOSIS — J189 Pneumonia, unspecified organism: Secondary | ICD-10-CM | POA: Diagnosis not present

## 2022-03-14 DIAGNOSIS — I480 Paroxysmal atrial fibrillation: Secondary | ICD-10-CM | POA: Diagnosis not present

## 2022-03-15 LAB — CULTURE, BLOOD (ROUTINE X 2)
Culture: NO GROWTH
Culture: NO GROWTH

## 2022-03-18 DIAGNOSIS — D1809 Hemangioma of other sites: Secondary | ICD-10-CM | POA: Diagnosis not present

## 2022-03-18 DIAGNOSIS — Z1283 Encounter for screening for malignant neoplasm of skin: Secondary | ICD-10-CM | POA: Diagnosis not present

## 2022-03-18 DIAGNOSIS — X32XXXD Exposure to sunlight, subsequent encounter: Secondary | ICD-10-CM | POA: Diagnosis not present

## 2022-03-18 DIAGNOSIS — D1801 Hemangioma of skin and subcutaneous tissue: Secondary | ICD-10-CM | POA: Diagnosis not present

## 2022-03-18 DIAGNOSIS — D225 Melanocytic nevi of trunk: Secondary | ICD-10-CM | POA: Diagnosis not present

## 2022-03-18 DIAGNOSIS — L570 Actinic keratosis: Secondary | ICD-10-CM | POA: Diagnosis not present

## 2022-03-28 ENCOUNTER — Other Ambulatory Visit (HOSPITAL_COMMUNITY): Payer: Self-pay | Admitting: Internal Medicine

## 2022-03-28 DIAGNOSIS — J189 Pneumonia, unspecified organism: Secondary | ICD-10-CM

## 2022-04-06 ENCOUNTER — Other Ambulatory Visit: Payer: Self-pay | Admitting: Internal Medicine

## 2022-04-06 DIAGNOSIS — I480 Paroxysmal atrial fibrillation: Secondary | ICD-10-CM

## 2022-04-08 NOTE — Telephone Encounter (Signed)
Eliquis '5mg'$  refill request received. Patient is 76 years old, weight-80.2kg, Crea-0.98 on 03/10/2022, Diagnosis-Afib, and last seen by Dr. Lovena Le on 03/13/2021 and pending an appt on 05/14/2022. Dose is appropriate based on dosing criteria. Will send in refill to requested pharmacy.

## 2022-04-20 DIAGNOSIS — R739 Hyperglycemia, unspecified: Secondary | ICD-10-CM | POA: Diagnosis not present

## 2022-04-23 ENCOUNTER — Ambulatory Visit (HOSPITAL_COMMUNITY)
Admission: RE | Admit: 2022-04-23 | Discharge: 2022-04-23 | Disposition: A | Discharge status: Home / Self Care | Payer: PPO | Source: Ambulatory Visit | Attending: Internal Medicine | Admitting: Internal Medicine

## 2022-04-23 DIAGNOSIS — R7301 Impaired fasting glucose: Secondary | ICD-10-CM | POA: Diagnosis not present

## 2022-04-23 DIAGNOSIS — Z125 Encounter for screening for malignant neoplasm of prostate: Secondary | ICD-10-CM | POA: Diagnosis not present

## 2022-04-23 DIAGNOSIS — J189 Pneumonia, unspecified organism: Secondary | ICD-10-CM | POA: Insufficient documentation

## 2022-04-23 DIAGNOSIS — I480 Paroxysmal atrial fibrillation: Secondary | ICD-10-CM | POA: Diagnosis not present

## 2022-04-30 DIAGNOSIS — F5221 Male erectile disorder: Secondary | ICD-10-CM | POA: Diagnosis not present

## 2022-04-30 DIAGNOSIS — R7301 Impaired fasting glucose: Secondary | ICD-10-CM | POA: Diagnosis not present

## 2022-04-30 DIAGNOSIS — Z0001 Encounter for general adult medical examination with abnormal findings: Secondary | ICD-10-CM | POA: Diagnosis not present

## 2022-04-30 DIAGNOSIS — Z8616 Personal history of COVID-19: Secondary | ICD-10-CM | POA: Diagnosis not present

## 2022-04-30 DIAGNOSIS — E782 Mixed hyperlipidemia: Secondary | ICD-10-CM | POA: Diagnosis not present

## 2022-04-30 DIAGNOSIS — M67432 Ganglion, left wrist: Secondary | ICD-10-CM | POA: Diagnosis not present

## 2022-04-30 DIAGNOSIS — K5900 Constipation, unspecified: Secondary | ICD-10-CM | POA: Diagnosis not present

## 2022-04-30 DIAGNOSIS — K219 Gastro-esophageal reflux disease without esophagitis: Secondary | ICD-10-CM | POA: Diagnosis not present

## 2022-04-30 DIAGNOSIS — D696 Thrombocytopenia, unspecified: Secondary | ICD-10-CM | POA: Diagnosis not present

## 2022-04-30 DIAGNOSIS — I480 Paroxysmal atrial fibrillation: Secondary | ICD-10-CM | POA: Diagnosis not present

## 2022-04-30 DIAGNOSIS — M5136 Other intervertebral disc degeneration, lumbar region: Secondary | ICD-10-CM | POA: Diagnosis not present

## 2022-04-30 DIAGNOSIS — Z86010 Personal history of colonic polyps: Secondary | ICD-10-CM | POA: Diagnosis not present

## 2022-05-14 ENCOUNTER — Ambulatory Visit: Payer: PPO | Attending: Internal Medicine | Admitting: Internal Medicine

## 2022-05-14 ENCOUNTER — Encounter: Payer: Self-pay | Admitting: Internal Medicine

## 2022-05-14 ENCOUNTER — Ambulatory Visit: Payer: PPO | Attending: Internal Medicine

## 2022-05-14 VITALS — BP 130/86 | HR 61 | Ht 71.0 in | Wt 172.0 lb

## 2022-05-14 DIAGNOSIS — R002 Palpitations: Secondary | ICD-10-CM

## 2022-05-14 NOTE — Patient Instructions (Signed)
Medication Instructions:  Your physician recommends that you continue on your current medications as directed. Please refer to the Current Medication list given to you today.  *If you need a refill on your cardiac medications before your next appointment, please call your pharmacy*   Lab Work: NONE   If you have labs (blood work) drawn today and your tests are completely normal, you will receive your results only by: [place] (if you have MyChart) OR A paper copy in the mail If you have any lab test that is abnormal or we need to change your treatment, we will call you to review the results.   Testing/Procedures: [place] [place]- Long Term Monitor Instructions   Your physician has requested you wear your ZIO patch monitor___14____days.   This is a single patch monitor.  [employer] supplies one patch monitor per enrollment.  Additional stickers are not available.   Please do not apply patch if you will be having a Nuclear Stress Test, Echocardiogram, Cardiac CT, MRI, or Chest Xray during the time frame you would be wearing the monitor. The patch cannot be worn during these tests.  You cannot remove and re-apply the ZIO XT patch monitor.   Your ZIO patch monitor will be sent USPS Priority mail from [facility] directly to your home address. The monitor may also be mailed to a PO BOX if home delivery is not available.   It may take 3-5 days to receive your monitor after you have been enrolled.   Once you have received you monitor, please review enclosed instructions.  Your monitor has already been registered assigning a specific monitor serial # to you.   Applying the monitor   Shave hair from upper left chest.   Hold abrader disc by orange tab.  Rub abrader in 40 strokes over left upper chest as indicated in your monitor instructions.   Clean area with 4 enclosed alcohol pads .  Use all pads to assure are is cleaned thoroughly.  Let dry.   Apply patch as indicated in monitor  instructions.  Patch will be place under collarbone on left side of chest with arrow pointing upward.   Rub patch adhesive wings for 2 minutes.Remove white label marked "1".  Remove white label marked "2".  Rub patch adhesive wings for 2 additional minutes.   While looking in a mirror, press and release button in center of patch.  A small green light will flash 3-4 times .  This will be your only indicator the monitor has been turned on.     Do not shower for the first 24 hours.  You may shower after the first 24 hours.   Press button if you feel a symptom. You will hear a small click.  Record Date, Time and Symptom in the Patient Log Book.   When you are ready to remove patch, follow instructions on last 2 pages of Patient Log Book.  Stick patch monitor onto last page of Patient Log Book.   Place Patient Log Book in [place] box.  Use locking tab on box and tape box closed securely.  The [employer] and [employer] has [employer] on it.  Please place in mailbox as soon as possible.  Your physician should have your test results approximately 7 days after the monitor has been mailed back to [facility]   Call [facility] at [phone] if you have questions regarding your ZIO XT patch monitor.  Call them immediately if you see an orange  light blinking on your monitor.   If your monitor falls off in less than 4 days contact our Monitor department at [phone].  If your monitor becomes loose or falls off after 4 days call [employer] at [phone] for suggestions on securing your monitor.     Follow-Up: At [facility], you and your health needs are our priority.  As part of our continuing mission to provide you with exceptional heart care, we have created designated Provider Care Teams.  These Care Teams include your primary Cardiologist (physician) and Advanced Practice Providers (APPs -  Physician Assistants and Nurse Practitioners) who all work together to provide  you with the care you need, when you need it.  We recommend signing up for the patient portal called "MyChart".  Sign up information is provided on this After Visit Summary.  MyChart is used to connect with patients for Virtual Visits (Telemedicine).  Patients are able to view lab/test results, encounter notes, upcoming appointments, etc.  Non-urgent messages can be sent to your provider as well.   To learn more about what you can do with MyChart, go to [email or web address].    Your next appointment:    To Be Determined   The format for your next appointment:   In Person  Provider:   Cristopher Peru, MD     Other Instructions Thank you for choosing [facility]!    Important Information About [facility]

## 2022-05-14 NOTE — Progress Notes (Signed)
HPI Mr. Persichetti returns today for followup. He is a pleasant 76 yo man with a h/o PAF and HTN. He has been on flecainide 50 mg bid and takes an occaisional 1/2 tab as needed for break through. He notes that he takes 150 mg a day several days a month. He denies chest pain though does have sob at times when he walks up stairs. He thinks he may have some extra palpitations. No Known Allergies   Current Outpatient Medications  Medication Sig Dispense Refill   albuterol (VENTOLIN HFA) 108 (90 Base) MCG/ACT inhaler Inhale 2 puffs into the lungs every 6 (six) hours as needed for wheezing or shortness of breath. 18 g 0   apixaban (ELIQUIS) 5 MG TABS tablet TAKE 1 TABLET BY MOUTH TWICE A DAY 60 tablet 2   augmented betamethasone dipropionate (DIPROLENE-AF) 0.05 % cream Apply topically 2 (two) times daily as needed.     benzonatate (TESSALON) 200 MG capsule Take 1 capsule (200 mg total) by mouth 3 (three) times daily as needed for cough. 20 capsule 0   Cholecalciferol (VITAMIN D3 EXTRA STRENGTH PO) Take by mouth.     flecainide (TAMBOCOR) 100 MG tablet TAKE 1 TABLET BY MOUTH EVERY DAY 90 tablet 0   HYDROcodone bit-homatropine (HYCODAN) 5-1.5 MG/5ML syrup Take 5 mLs by mouth every 6 (six) hours as needed for cough. 120 mL 0   Multiple Vitamin (MULTIVITAMIN) tablet Take 1 tablet by mouth daily.     sildenafil (VIAGRA) 100 MG tablet Take 100 mg by mouth as needed for erectile dysfunction.     No current facility-administered medications for this visit.     Past Medical History:  Diagnosis Date   Borderline hyperlipidemia    Chest pain    Dysrhythmia    Exercise-induced tachycardia    Ganglion    left wrist   GERD (gastroesophageal reflux disease)    Thrombocytopenia (HCC)    borderline    ROS:   All systems reviewed and negative except as noted in the HPI.   Past Surgical History:  Procedure Laterality Date   COLONOSCOPY N/A 04/22/2018   Procedure: COLONOSCOPY;  Surgeon:  Daneil Dolin, MD;  Location[facility];  Service: Endoscopy;  Laterality: N/A;  8:30   GANGLION CYST EXCISION     Left Wrist   KNEE ARTHROSCOPY     Left   TONSILLECTOMY       Family History  Problem Relation Age of Onset   Aneurysm Father        Aortic   Coronary artery disease Brother        PCI     Social History   Socioeconomic History   Marital status: Married    Spouse name: Not on file   Number of children: Not on file   Years of education: Not on file   Highest education level: Not on file  Occupational History   Occupation: Press photographer for [employer], Freight forwarder of concrete forms  Tobacco Use   Smoking status: Never   Smokeless tobacco: Never  Vaping Use   Vaping Use: Never used  Substance and Sexual Activity   Alcohol use: Yes    Alcohol/week: 0.0 standard drinks of alcohol    Comment: Occasional wine   Drug use: No   Sexual activity: Not on file  Other Topics Concern   Not on file  Social History Narrative   Married, lives locally, 2 adult children   No regular exercise  Social Determinants of Health   Financial Resource Strain: Not on file  Food Insecurity: Not on file  Transportation Needs: Not on file  Physical Activity: Not on file  Stress: Not on file  Social Connections: Not on file  Intimate Partner Violence: Not on file     BP 130/86   Pulse 61   Ht '5\' 11"'$  (1.803 m)   Wt 172 lb (78 kg)   SpO2 96%   BMI 23.99 kg/m   Physical Exam:  Well appearing NAD HEENT: Unremarkable Neck:  No JVD, no thyromegally Lymphatics:  No adenopathy Back:  No CVA tenderness Lungs:  Clear HEART:  Regular rate rhythm, no murmurs, no rubs, no clicks Abd:  soft, positive bowel sounds, no organomegally, no rebound, no guarding Ext:  2 plus pulses, no edema, no cyanosis, no clubbing Skin:  No rashes no nodules Neuro:  CN II through XII intact, motor grossly intact   Assess/Plan:   PAF - he has had some additional palpitations on fairly low  dose flecainide. He will continue taking 100 mg daily, taking an extra 50 mg as needed. I asked him to wear a Zio monitor.  Coags - he will continue taking eliquis. HTN - his bp is well controlled. He will avoid salty foods.  4. Dyspnea - we will check his heart monitor and consider an echo pending the monitor results.   Carleene Overlie Cassidi Modesitt,MD

## 2022-05-30 DIAGNOSIS — Z23 Encounter for immunization: Secondary | ICD-10-CM | POA: Diagnosis not present

## 2022-05-30 DIAGNOSIS — S91332A Puncture wound without foreign body, left foot, initial encounter: Secondary | ICD-10-CM | POA: Diagnosis not present

## 2022-06-02 ENCOUNTER — Other Ambulatory Visit: Payer: Self-pay | Admitting: Internal Medicine

## 2022-06-03 DIAGNOSIS — R002 Palpitations: Secondary | ICD-10-CM | POA: Diagnosis not present

## 2022-06-06 DIAGNOSIS — R55 Syncope and collapse: Secondary | ICD-10-CM | POA: Diagnosis not present

## 2022-06-24 ENCOUNTER — Telehealth: Payer: Self-pay | Admitting: Internal Medicine

## 2022-06-24 NOTE — Telephone Encounter (Signed)
Pt is requesting call back in regards to monitor results.

## 2022-07-04 ENCOUNTER — Other Ambulatory Visit: Payer: Self-pay | Admitting: Internal Medicine

## 2022-07-04 DIAGNOSIS — I480 Paroxysmal atrial fibrillation: Secondary | ICD-10-CM

## 2022-07-04 NOTE — Telephone Encounter (Signed)
Prescription refill request for Eliquis received. Indication: afib  Last office visit: 05/14/2022 Scr: 0.98, 03/10/2022 Age: 76 yo  Weight: 78 kg   Refill sent.

## 2022-07-04 NOTE — Telephone Encounter (Signed)
Monitor shows that he is out of rhythm 1% of the time. Continue flecainide 50 mg twice daily with an additional 50 mg as needed for break through symptoms.

## 2022-07-05 MED ORDER — FLECAINIDE ACETATE 100 MG PO TABS: 50.0000 mg | ORAL_TABLET | Freq: Two times a day (BID) | ORAL | 1 refills | Status: DC

## 2022-07-05 NOTE — Telephone Encounter (Signed)
I spoke with patient and discussed monitor results. He will continue to take flecainide 50 mg twice a day and use an additional 50 mg for breakthrough symptoms.pcp copied

## 2022-07-09 DIAGNOSIS — R52 Pain, unspecified: Secondary | ICD-10-CM | POA: Diagnosis not present

## 2022-07-09 DIAGNOSIS — M549 Dorsalgia, unspecified: Secondary | ICD-10-CM | POA: Diagnosis not present

## 2022-07-29 DIAGNOSIS — R456 Violent behavior: Secondary | ICD-10-CM | POA: Diagnosis not present

## 2022-07-29 DIAGNOSIS — E162 Hypoglycemia, unspecified: Secondary | ICD-10-CM | POA: Diagnosis not present

## 2022-07-29 DIAGNOSIS — E161 Other hypoglycemia: Secondary | ICD-10-CM | POA: Diagnosis not present

## 2022-07-29 DIAGNOSIS — F69 Unspecified disorder of adult personality and behavior: Secondary | ICD-10-CM | POA: Diagnosis not present

## 2022-07-29 DIAGNOSIS — R4182 Altered mental status, unspecified: Secondary | ICD-10-CM | POA: Diagnosis not present

## 2022-08-15 DIAGNOSIS — S76012A Strain of muscle, fascia and tendon of left hip, initial encounter: Secondary | ICD-10-CM | POA: Diagnosis not present

## 2022-08-15 DIAGNOSIS — K117 Disturbances of salivary secretion: Secondary | ICD-10-CM | POA: Diagnosis not present

## 2022-09-05 DIAGNOSIS — R569 Unspecified convulsions: Secondary | ICD-10-CM | POA: Diagnosis not present

## 2022-10-13 DIAGNOSIS — E162 Hypoglycemia, unspecified: Secondary | ICD-10-CM | POA: Diagnosis not present

## 2022-10-13 DIAGNOSIS — E161 Other hypoglycemia: Secondary | ICD-10-CM | POA: Diagnosis not present

## 2022-10-25 DIAGNOSIS — R7301 Impaired fasting glucose: Secondary | ICD-10-CM | POA: Diagnosis not present

## 2022-10-25 DIAGNOSIS — I480 Paroxysmal atrial fibrillation: Secondary | ICD-10-CM | POA: Diagnosis not present

## 2022-10-26 LAB — LAB REPORT - SCANNED
A1c: 5.8
EGFR: 74

## 2022-10-31 DIAGNOSIS — R499 Unspecified voice and resonance disorder: Secondary | ICD-10-CM | POA: Diagnosis not present

## 2022-10-31 DIAGNOSIS — E782 Mixed hyperlipidemia: Secondary | ICD-10-CM | POA: Diagnosis not present

## 2022-10-31 DIAGNOSIS — D696 Thrombocytopenia, unspecified: Secondary | ICD-10-CM | POA: Diagnosis not present

## 2022-10-31 DIAGNOSIS — K219 Gastro-esophageal reflux disease without esophagitis: Secondary | ICD-10-CM | POA: Diagnosis not present

## 2022-10-31 DIAGNOSIS — Z86010 Personal history of colonic polyps: Secondary | ICD-10-CM | POA: Diagnosis not present

## 2022-10-31 DIAGNOSIS — F5221 Male erectile disorder: Secondary | ICD-10-CM | POA: Diagnosis not present

## 2022-10-31 DIAGNOSIS — I480 Paroxysmal atrial fibrillation: Secondary | ICD-10-CM | POA: Diagnosis not present

## 2022-10-31 DIAGNOSIS — M67432 Ganglion, left wrist: Secondary | ICD-10-CM | POA: Diagnosis not present

## 2022-10-31 DIAGNOSIS — R7301 Impaired fasting glucose: Secondary | ICD-10-CM | POA: Diagnosis not present

## 2022-10-31 DIAGNOSIS — Z0001 Encounter for general adult medical examination with abnormal findings: Secondary | ICD-10-CM | POA: Diagnosis not present

## 2022-10-31 DIAGNOSIS — M5136 Other intervertebral disc degeneration, lumbar region: Secondary | ICD-10-CM | POA: Diagnosis not present

## 2022-10-31 DIAGNOSIS — K5900 Constipation, unspecified: Secondary | ICD-10-CM | POA: Diagnosis not present

## 2022-11-22 ENCOUNTER — Other Ambulatory Visit: Payer: Self-pay | Admitting: Internal Medicine

## 2022-12-26 ENCOUNTER — Other Ambulatory Visit: Payer: Self-pay | Admitting: Internal Medicine

## 2022-12-26 DIAGNOSIS — I480 Paroxysmal atrial fibrillation: Secondary | ICD-10-CM

## 2022-12-26 NOTE — Telephone Encounter (Signed)
Prescription refill request for Eliquis received. Indication: afib  Last office visit: Greg Mann 05/14/2022 Scr: 1.04, 10/25/2022 Age: 77 yo  Weight: 78 kg    Refill sent.

## 2023-04-09 ENCOUNTER — Telehealth: Payer: Self-pay | Admitting: Internal Medicine

## 2023-04-09 DIAGNOSIS — I480 Paroxysmal atrial fibrillation: Secondary | ICD-10-CM

## 2023-04-09 MED ORDER — APIXABAN 5 MG PO TABS: 5.0000 mg | ORAL_TABLET | Freq: Two times a day (BID) | ORAL | 0 refills | Status: DC

## 2023-04-09 NOTE — Telephone Encounter (Signed)
Sample request for Eliquis received. Indication: PAF Last office visit: 05/14/22  Rosette Reveal MD Scr: 1.04 on 10/25/22  Epic Age: 77 Weight: 78kg  Based on above findings Eliquis 5mg  twice daily is the appropriate dose.  Ok to give samples if available.

## 2023-04-09 NOTE — Telephone Encounter (Signed)
This doesn't seem correct, Eliquis is not on backorder and pharmacy should either already have it in stock or be able to order it for the next day. Is it a cost/donut hole issue instead? That would make more sense.

## 2023-04-09 NOTE — Telephone Encounter (Signed)
Pt notified that medication is available for pick up at front desk.

## 2023-04-09 NOTE — Addendum Note (Signed)
Addended by: Kerney Elbe on: 04/09/2023 02:17 PM   Modules accepted: Orders

## 2023-04-09 NOTE — Telephone Encounter (Signed)
Patient is a [facility] patient will send message to  to see if they have any samples for patient. Patient stated he would like to be called on his cell phone. He stated he would only need enough for 4 days until pharmacy got eliquis back in stock.

## 2023-04-09 NOTE — Telephone Encounter (Signed)
Pt c/o medication issue:  1. Name of Medication: ELIQUIS 5 MG TABS tablet   2. How are you currently taking this medication (dosage and times per day)?    3. Are you having a reaction (difficulty breathing--STAT)? no  4. What is your medication issue? Pharmacy doesn't have any medication in stock. Calling to see if our office have any samples. Please advise

## 2023-04-23 DIAGNOSIS — E161 Other hypoglycemia: Secondary | ICD-10-CM | POA: Diagnosis not present

## 2023-04-25 DIAGNOSIS — F5221 Male erectile disorder: Secondary | ICD-10-CM | POA: Diagnosis not present

## 2023-04-25 DIAGNOSIS — R7301 Impaired fasting glucose: Secondary | ICD-10-CM | POA: Diagnosis not present

## 2023-04-25 DIAGNOSIS — I480 Paroxysmal atrial fibrillation: Secondary | ICD-10-CM | POA: Diagnosis not present

## 2023-04-26 LAB — LAB REPORT - SCANNED
A1c: 5.7
EGFR: 68

## 2023-05-01 ENCOUNTER — Encounter: Payer: Self-pay | Admitting: Internal Medicine

## 2023-05-01 DIAGNOSIS — N529 Male erectile dysfunction, unspecified: Secondary | ICD-10-CM | POA: Diagnosis not present

## 2023-05-01 DIAGNOSIS — K5900 Constipation, unspecified: Secondary | ICD-10-CM | POA: Diagnosis not present

## 2023-05-01 DIAGNOSIS — E782 Mixed hyperlipidemia: Secondary | ICD-10-CM | POA: Diagnosis not present

## 2023-05-01 DIAGNOSIS — D696 Thrombocytopenia, unspecified: Secondary | ICD-10-CM | POA: Diagnosis not present

## 2023-05-01 DIAGNOSIS — R499 Unspecified voice and resonance disorder: Secondary | ICD-10-CM | POA: Diagnosis not present

## 2023-05-01 DIAGNOSIS — Z86010 Personal history of colonic polyps: Secondary | ICD-10-CM | POA: Diagnosis not present

## 2023-05-01 DIAGNOSIS — M67432 Ganglion, left wrist: Secondary | ICD-10-CM | POA: Diagnosis not present

## 2023-05-01 DIAGNOSIS — I480 Paroxysmal atrial fibrillation: Secondary | ICD-10-CM | POA: Diagnosis not present

## 2023-05-01 DIAGNOSIS — R7301 Impaired fasting glucose: Secondary | ICD-10-CM | POA: Diagnosis not present

## 2023-05-01 DIAGNOSIS — M5136 Other intervertebral disc degeneration, lumbar region: Secondary | ICD-10-CM | POA: Diagnosis not present

## 2023-05-01 DIAGNOSIS — R42 Dizziness and giddiness: Secondary | ICD-10-CM | POA: Diagnosis not present

## 2023-05-01 DIAGNOSIS — K219 Gastro-esophageal reflux disease without esophagitis: Secondary | ICD-10-CM | POA: Diagnosis not present

## 2023-05-07 ENCOUNTER — Other Ambulatory Visit: Payer: Self-pay | Admitting: Internal Medicine

## 2023-05-30 ENCOUNTER — Other Ambulatory Visit: Payer: Self-pay | Admitting: Internal Medicine

## 2023-06-02 DIAGNOSIS — H25813 Combined forms of age-related cataract, bilateral: Secondary | ICD-10-CM | POA: Diagnosis not present

## 2023-06-03 DIAGNOSIS — J019 Acute sinusitis, unspecified: Secondary | ICD-10-CM | POA: Diagnosis not present

## 2023-06-03 DIAGNOSIS — R053 Chronic cough: Secondary | ICD-10-CM | POA: Diagnosis not present

## 2023-06-03 DIAGNOSIS — R059 Cough, unspecified: Secondary | ICD-10-CM | POA: Diagnosis not present

## 2023-06-25 ENCOUNTER — Other Ambulatory Visit: Payer: Self-pay | Admitting: Internal Medicine

## 2023-07-08 ENCOUNTER — Other Ambulatory Visit: Payer: Self-pay | Admitting: Internal Medicine

## 2023-07-08 DIAGNOSIS — I480 Paroxysmal atrial fibrillation: Secondary | ICD-10-CM

## 2023-07-09 NOTE — Telephone Encounter (Signed)
Prescription refill request for Eliquis received. Indication: PAF Last office visit: 05/14/22  Rosette Reveal MD Scr: 1.11 on 04/25/23  Epic Age: 77 Weight: 78kg  Based on above findings Eliquis 5mg  twice daily is the appropriate dose.  Refill approved. Pt is due for appt with Dr Ladona Ridgel.  Message sent to schedulers to make appt.

## 2023-07-18 ENCOUNTER — Other Ambulatory Visit: Payer: Self-pay | Admitting: Internal Medicine

## 2023-07-31 ENCOUNTER — Ambulatory Visit: Payer: PPO | Attending: Internal Medicine | Admitting: Internal Medicine

## 2023-07-31 ENCOUNTER — Encounter: Payer: Self-pay | Admitting: Internal Medicine

## 2023-07-31 VITALS — BP 136/74 | HR 55 | Ht 71.0 in | Wt 168.0 lb

## 2023-07-31 DIAGNOSIS — I480 Paroxysmal atrial fibrillation: Secondary | ICD-10-CM | POA: Diagnosis not present

## 2023-07-31 NOTE — Progress Notes (Signed)
HPI Mr. Ghosh returns today for followup. He is a pleasant 77 yo man with a h/o PAF and HTN. He has been on flecainide 50 mg bid and takes an occaisional 1/2 tab as needed for break through. He notes that he takes 150 mg a day several days a month. He denies chest pain though does have sob at times when he walks up stairs. He thinks he may have some extra palpitations. No Known Allergies   Current Outpatient Medications  Medication Sig Dispense Refill   albuterol (VENTOLIN HFA) 108 (90 Base) MCG/ACT inhaler Inhale 2 puffs into the lungs every 6 (six) hours as needed for wheezing or shortness of breath. 18 g 0   augmented betamethasone dipropionate (DIPROLENE-AF) 0.05 % cream Apply topically 2 (two) times daily as needed.     benzonatate (TESSALON) 200 MG capsule Take 1 capsule (200 mg total) by mouth 3 (three) times daily as needed for cough. 20 capsule 0   Cholecalciferol (VITAMIN D3 EXTRA STRENGTH PO) Take by mouth.     ELIQUIS 5 MG TABS tablet TAKE 1 TABLET BY MOUTH TWICE A DAY 60 tablet 1   flecainide (TAMBOCOR) 100 MG tablet TAKE 1 TABLET BY MOUTH EVERY DAY 90 tablet 0   HYDROcodone bit-homatropine (HYCODAN) 5-1.5 MG/5ML syrup Take 5 mLs by mouth every 6 (six) hours as needed for cough. 120 mL 0   Multiple Vitamin (MULTIVITAMIN) tablet Take 1 tablet by mouth daily.     sildenafil (VIAGRA) 100 MG tablet Take 100 mg by mouth as needed for erectile dysfunction.     No current facility-administered medications for this visit.     Past Medical History:  Diagnosis Date   Borderline hyperlipidemia    Chest pain    Dysrhythmia    Exercise-induced tachycardia    Ganglion    left wrist   GERD (gastroesophageal reflux disease)    Thrombocytopenia (HCC)    borderline    ROS:   All systems reviewed and negative except as noted in the HPI.   Past Surgical History:  Procedure Laterality Date   COLONOSCOPY N/A 04/22/2018   Procedure: COLONOSCOPY;  Surgeon: Corbin Ade,  MD;  Location: [facility];  Service: Endoscopy;  Laterality: N/A;  8:30   GANGLION CYST EXCISION     Left Wrist   KNEE ARTHROSCOPY     Left   TONSILLECTOMY       Family History  Problem Relation Age of Onset   Aneurysm Father        Aortic   Coronary artery disease Brother        PCI     Social History   Socioeconomic History   Marital status: Married    Spouse name: Not on file   Number of children: Not on file   Years of education: Not on file   Highest education level: Not on file  Occupational History   Occupation: Airline pilot for [employer], 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  Tobacco Use   Smoking status: Never   Smokeless tobacco: Never  Vaping Use   Vaping status: Never Used  Substance and Sexual Activity   Alcohol use: Yes    Alcohol/week: 0.0 standard drinks of alcohol    Comment: Occasional wine   Drug use: No   Sexual activity: Not on file  Other Topics Concern   Not on file  Social History Narrative   Married, lives locally, 2 adult children   No regular exercise   Social  Determinants of Health   Financial Resource Strain: Not on file  Food Insecurity: Not on file  Transportation Needs: Not on file  Physical Activity: Not on file  Stress: Not on file  Social Connections: Not on file  Intimate Partner Violence: Not on file     Ht 5\' 11"  (1.803 m)   Wt 168 lb (76.2 kg)   BMI 23.43 kg/m   Physical Exam:  Well appearing NAD HEENT: Unremarkable Neck:  No JVD, no thyromegally Lymphatics:  No adenopathy Back:  No CVA tenderness Lungs:  Clear HEART:  Regular rate rhythm, no murmurs, no rubs, no clicks Abd:  soft, positive bowel sounds, no organomegally, no rebound, no guarding Ext:  2 plus pulses, no edema, no cyanosis, no clubbing Skin:  No rashes no nodules Neuro:  CN II through XII intact, motor grossly intact  EKG - sinus brady  DEVICE  Normal device function.  See PaceArt for details.   Assess/Plan:  PAF - he has had some  additional palpitations on fairly low dose flecainide. He will continue taking 100 mg daily, taking an extra 50 mg as needed. I asked him to wear a Zio monitor.  Coags - he will continue taking eliquis. HTN - his bp is well controlled. He will avoid salty foods.  4. Dyspnea - he is mostly sedentary and I encouraged him to start walking daily, 6 days a week.    Sharlot Gowda Dametrius Sanjuan,MD

## 2023-07-31 NOTE — Patient Instructions (Signed)

## 2023-09-11 ENCOUNTER — Other Ambulatory Visit: Payer: Self-pay | Admitting: Internal Medicine

## 2023-09-11 DIAGNOSIS — I480 Paroxysmal atrial fibrillation: Secondary | ICD-10-CM

## 2023-09-11 NOTE — Telephone Encounter (Signed)
 Prescription refill request for Eliquis received. Indication: PAF Last office visit: 07/31/23  Rosette Reveal MD Scr: 1.11 on 04/25/23  Epic Age: 78 Weight: 76.2kg  Based on above findings Eliquis 5mg  twice daily is the appropriate dose.  Refill approved.

## 2023-10-17 ENCOUNTER — Other Ambulatory Visit: Payer: Self-pay | Admitting: Internal Medicine

## 2023-10-21 DIAGNOSIS — M9902 Segmental and somatic dysfunction of thoracic region: Secondary | ICD-10-CM | POA: Diagnosis not present

## 2023-10-21 DIAGNOSIS — M62830 Muscle spasm of back: Secondary | ICD-10-CM | POA: Diagnosis not present

## 2023-10-21 DIAGNOSIS — M542 Cervicalgia: Secondary | ICD-10-CM | POA: Diagnosis not present

## 2023-10-21 DIAGNOSIS — M9901 Segmental and somatic dysfunction of cervical region: Secondary | ICD-10-CM | POA: Diagnosis not present

## 2023-10-21 DIAGNOSIS — M546 Pain in thoracic spine: Secondary | ICD-10-CM | POA: Diagnosis not present

## 2023-10-21 DIAGNOSIS — M9903 Segmental and somatic dysfunction of lumbar region: Secondary | ICD-10-CM | POA: Diagnosis not present

## 2023-10-27 DIAGNOSIS — M9903 Segmental and somatic dysfunction of lumbar region: Secondary | ICD-10-CM | POA: Diagnosis not present

## 2023-10-27 DIAGNOSIS — M62830 Muscle spasm of back: Secondary | ICD-10-CM | POA: Diagnosis not present

## 2023-10-27 DIAGNOSIS — M546 Pain in thoracic spine: Secondary | ICD-10-CM | POA: Diagnosis not present

## 2023-10-27 DIAGNOSIS — M542 Cervicalgia: Secondary | ICD-10-CM | POA: Diagnosis not present

## 2023-10-27 DIAGNOSIS — M9902 Segmental and somatic dysfunction of thoracic region: Secondary | ICD-10-CM | POA: Diagnosis not present

## 2023-10-27 DIAGNOSIS — M9901 Segmental and somatic dysfunction of cervical region: Secondary | ICD-10-CM | POA: Diagnosis not present

## 2023-11-17 DIAGNOSIS — M9902 Segmental and somatic dysfunction of thoracic region: Secondary | ICD-10-CM | POA: Diagnosis not present

## 2023-11-17 DIAGNOSIS — M9903 Segmental and somatic dysfunction of lumbar region: Secondary | ICD-10-CM | POA: Diagnosis not present

## 2023-11-17 DIAGNOSIS — M9901 Segmental and somatic dysfunction of cervical region: Secondary | ICD-10-CM | POA: Diagnosis not present

## 2023-11-17 DIAGNOSIS — M542 Cervicalgia: Secondary | ICD-10-CM | POA: Diagnosis not present

## 2023-11-17 DIAGNOSIS — M62830 Muscle spasm of back: Secondary | ICD-10-CM | POA: Diagnosis not present

## 2023-11-17 DIAGNOSIS — M546 Pain in thoracic spine: Secondary | ICD-10-CM | POA: Diagnosis not present

## 2023-11-19 DIAGNOSIS — M9902 Segmental and somatic dysfunction of thoracic region: Secondary | ICD-10-CM | POA: Diagnosis not present

## 2023-11-19 DIAGNOSIS — M62830 Muscle spasm of back: Secondary | ICD-10-CM | POA: Diagnosis not present

## 2023-11-19 DIAGNOSIS — M546 Pain in thoracic spine: Secondary | ICD-10-CM | POA: Diagnosis not present

## 2023-11-19 DIAGNOSIS — M9901 Segmental and somatic dysfunction of cervical region: Secondary | ICD-10-CM | POA: Diagnosis not present

## 2023-11-19 DIAGNOSIS — M542 Cervicalgia: Secondary | ICD-10-CM | POA: Diagnosis not present

## 2023-11-19 DIAGNOSIS — M9903 Segmental and somatic dysfunction of lumbar region: Secondary | ICD-10-CM | POA: Diagnosis not present

## 2023-11-24 DIAGNOSIS — R7301 Impaired fasting glucose: Secondary | ICD-10-CM | POA: Diagnosis not present

## 2023-11-24 DIAGNOSIS — I480 Paroxysmal atrial fibrillation: Secondary | ICD-10-CM | POA: Diagnosis not present

## 2023-11-25 LAB — LAB REPORT - SCANNED
A1c: 5.9
Albumin, Urine POC: <3
Creatinine, POC: 110.8 mg/dL
EGFR: 78

## 2023-12-01 DIAGNOSIS — M51369 Other intervertebral disc degeneration, lumbar region without mention of lumbar back pain or lower extremity pain: Secondary | ICD-10-CM | POA: Diagnosis not present

## 2023-12-01 DIAGNOSIS — E782 Mixed hyperlipidemia: Secondary | ICD-10-CM | POA: Diagnosis not present

## 2023-12-01 DIAGNOSIS — D696 Thrombocytopenia, unspecified: Secondary | ICD-10-CM | POA: Diagnosis not present

## 2023-12-01 DIAGNOSIS — D649 Anemia, unspecified: Secondary | ICD-10-CM | POA: Diagnosis not present

## 2023-12-01 DIAGNOSIS — K5900 Constipation, unspecified: Secondary | ICD-10-CM | POA: Diagnosis not present

## 2023-12-01 DIAGNOSIS — M67432 Ganglion, left wrist: Secondary | ICD-10-CM | POA: Diagnosis not present

## 2023-12-01 DIAGNOSIS — R7303 Prediabetes: Secondary | ICD-10-CM | POA: Diagnosis not present

## 2023-12-01 DIAGNOSIS — K219 Gastro-esophageal reflux disease without esophagitis: Secondary | ICD-10-CM | POA: Diagnosis not present

## 2023-12-01 DIAGNOSIS — I480 Paroxysmal atrial fibrillation: Secondary | ICD-10-CM | POA: Diagnosis not present

## 2023-12-01 DIAGNOSIS — Z860100 Personal history of colon polyps, unspecified: Secondary | ICD-10-CM | POA: Diagnosis not present

## 2023-12-01 DIAGNOSIS — Z0001 Encounter for general adult medical examination with abnormal findings: Secondary | ICD-10-CM | POA: Diagnosis not present

## 2023-12-01 DIAGNOSIS — Z23 Encounter for immunization: Secondary | ICD-10-CM | POA: Diagnosis not present

## 2023-12-03 DIAGNOSIS — M9901 Segmental and somatic dysfunction of cervical region: Secondary | ICD-10-CM | POA: Diagnosis not present

## 2023-12-03 DIAGNOSIS — M9903 Segmental and somatic dysfunction of lumbar region: Secondary | ICD-10-CM | POA: Diagnosis not present

## 2023-12-03 DIAGNOSIS — M9902 Segmental and somatic dysfunction of thoracic region: Secondary | ICD-10-CM | POA: Diagnosis not present

## 2023-12-03 DIAGNOSIS — M62830 Muscle spasm of back: Secondary | ICD-10-CM | POA: Diagnosis not present

## 2023-12-03 DIAGNOSIS — M546 Pain in thoracic spine: Secondary | ICD-10-CM | POA: Diagnosis not present

## 2023-12-03 DIAGNOSIS — M542 Cervicalgia: Secondary | ICD-10-CM | POA: Diagnosis not present

## 2023-12-05 DIAGNOSIS — M542 Cervicalgia: Secondary | ICD-10-CM | POA: Diagnosis not present

## 2023-12-05 DIAGNOSIS — M62830 Muscle spasm of back: Secondary | ICD-10-CM | POA: Diagnosis not present

## 2023-12-05 DIAGNOSIS — M9903 Segmental and somatic dysfunction of lumbar region: Secondary | ICD-10-CM | POA: Diagnosis not present

## 2023-12-05 DIAGNOSIS — M9902 Segmental and somatic dysfunction of thoracic region: Secondary | ICD-10-CM | POA: Diagnosis not present

## 2023-12-05 DIAGNOSIS — R5383 Other fatigue: Secondary | ICD-10-CM | POA: Diagnosis not present

## 2023-12-05 DIAGNOSIS — M9901 Segmental and somatic dysfunction of cervical region: Secondary | ICD-10-CM | POA: Diagnosis not present

## 2023-12-05 DIAGNOSIS — M546 Pain in thoracic spine: Secondary | ICD-10-CM | POA: Diagnosis not present

## 2023-12-08 DIAGNOSIS — M9903 Segmental and somatic dysfunction of lumbar region: Secondary | ICD-10-CM | POA: Diagnosis not present

## 2023-12-08 DIAGNOSIS — M9901 Segmental and somatic dysfunction of cervical region: Secondary | ICD-10-CM | POA: Diagnosis not present

## 2023-12-08 DIAGNOSIS — M62830 Muscle spasm of back: Secondary | ICD-10-CM | POA: Diagnosis not present

## 2023-12-08 DIAGNOSIS — M546 Pain in thoracic spine: Secondary | ICD-10-CM | POA: Diagnosis not present

## 2023-12-08 DIAGNOSIS — M542 Cervicalgia: Secondary | ICD-10-CM | POA: Diagnosis not present

## 2023-12-08 DIAGNOSIS — M9902 Segmental and somatic dysfunction of thoracic region: Secondary | ICD-10-CM | POA: Diagnosis not present

## 2023-12-10 DIAGNOSIS — M9902 Segmental and somatic dysfunction of thoracic region: Secondary | ICD-10-CM | POA: Diagnosis not present

## 2023-12-10 DIAGNOSIS — M546 Pain in thoracic spine: Secondary | ICD-10-CM | POA: Diagnosis not present

## 2023-12-10 DIAGNOSIS — M62830 Muscle spasm of back: Secondary | ICD-10-CM | POA: Diagnosis not present

## 2023-12-10 DIAGNOSIS — M9901 Segmental and somatic dysfunction of cervical region: Secondary | ICD-10-CM | POA: Diagnosis not present

## 2023-12-10 DIAGNOSIS — M9903 Segmental and somatic dysfunction of lumbar region: Secondary | ICD-10-CM | POA: Diagnosis not present

## 2023-12-10 DIAGNOSIS — M542 Cervicalgia: Secondary | ICD-10-CM | POA: Diagnosis not present

## 2023-12-17 DIAGNOSIS — M546 Pain in thoracic spine: Secondary | ICD-10-CM | POA: Diagnosis not present

## 2023-12-17 DIAGNOSIS — M9902 Segmental and somatic dysfunction of thoracic region: Secondary | ICD-10-CM | POA: Diagnosis not present

## 2023-12-17 DIAGNOSIS — M9903 Segmental and somatic dysfunction of lumbar region: Secondary | ICD-10-CM | POA: Diagnosis not present

## 2023-12-17 DIAGNOSIS — M62830 Muscle spasm of back: Secondary | ICD-10-CM | POA: Diagnosis not present

## 2023-12-17 DIAGNOSIS — M9901 Segmental and somatic dysfunction of cervical region: Secondary | ICD-10-CM | POA: Diagnosis not present

## 2023-12-17 DIAGNOSIS — M542 Cervicalgia: Secondary | ICD-10-CM | POA: Diagnosis not present

## 2023-12-29 NOTE — Therapy (Signed)
 OUTPATIENT PHYSICAL THERAPY VESTIBULAR EVALUATION     Patient Name: Greg Mann MRN: [id number] DOB:Aug 09, 1946, 78 y.o., male Today's Date: 12/30/2023  END OF SESSION:  PT End of Session - 12/30/23 1348     Visit Number 1    Number of Visits 8    Date for PT Re-Evaluation 02/13/24    Authorization Type HTA    PT Start Time 1346    PT Stop Time 1428    PT Time Calculation (min) 42 min    Activity Tolerance Patient tolerated treatment well    Behavior During Therapy WFL for tasks assessed/performed             Past Medical History:  Diagnosis Date   Borderline hyperlipidemia    Chest pain    Dysrhythmia    Exercise-induced tachycardia    Ganglion    left wrist   GERD (gastroesophageal reflux disease)    Thrombocytopenia (HCC)    borderline   Past Surgical History:  Procedure Laterality Date   COLONOSCOPY N/A 04/22/2018   Procedure: COLONOSCOPY;  Surgeon: Suzette Espy, MD;  Location: [facility];  Service: Endoscopy;  Laterality: N/A;  8:30   GANGLION CYST EXCISION     Left Wrist   KNEE ARTHROSCOPY     Left   TONSILLECTOMY     Patient Active Problem List   Diagnosis Date Noted   RLL pneumonia 03/10/2022   Hypoalbuminemia 03/10/2022   Normocytic anemia 03/10/2022   Atrial flutter (HCC) 09/22/2013   Paroxysmal atrial fibrillation (HCC) 04/23/2013   Rapid palpitations 02/26/2013   FATIGUE 10/24/2009   THROMBOCYTOPENIA 10/11/2009   Chest pain 10/11/2009    PCP: Omie Bickers, MD REFERRING PROVIDER: Omie Bickers, MD  REFERRING DIAG: dizziness and giddiness  THERAPY DIAG:  Dizziness and giddiness - Plan: PT plan of care cert/re-cert  Other abnormalities of gait and mobility - Plan: PT plan of care cert/re-cert  ONSET DATE: 2 months or more  Rationale for Evaluation and Treatment: Rehabilitation  SUBJECTIVE:   SUBJECTIVE STATEMENT: After sitting a while he gets lightheaded; seems to trip himself; did have a fall forward about 2 months.  Doesn't seem to have any balance issues.  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 No  PRECAUTIONS: Fall    WEIGHT BEARING RESTRICTIONS: No  FALLS: Has patient fallen in last 6 months? Yes. Number of falls 1   PLOF: Independent  PATIENT GOALS: help with my balance  OBJECTIVE:  Note: Objective measures were completed at Evaluation unless otherwise noted.  DIAGNOSTIC FINDINGS: none  COGNITION: Overall cognitive status: Within functional limits for tasks assessed   SENSATION: WFL    POSTURE:  rounded shoulders, forward head, and flexed trunk  unable to fully stand upright; limited lumbar extension  Cervical ROM:  limited cervical extension and rotation  Active AROM (deg) eval  Flexion   Extension   Right lateral flexion   Left lateral flexion   Right rotation   Left rotation   (Blank rows = not tested)  STRENGTH: not tested  LOWER EXTREMITY MMT:   MMT Right eval Left eval  Hip flexion    Hip abduction    Hip adduction    Hip internal rotation    Hip external rotation    Knee flexion    Knee extension  Ankle dorsiflexion    Ankle plantarflexion    Ankle inversion    Ankle eversion    (Blank rows = not tested)  BED MOBILITY:  Not tested  TRANSFERS: Assistive device utilized: None  Sit to stand: Modified independence Stand to sit: Modified independence Chair to chair: Modified independence Floor: not tested  GAIT: Gait pattern: decreased arm swing- Right, decreased arm swing- Left, trunk flexed, poor foot clearance- Right, and poor foot clearance- Left Distance walked: 50 ft in clinic Assistive device utilized: None Level of assistance: Modified independence Comments: difficulty with turns; direction changes  FUNCTIONAL TESTS:  5 times sit to stand: 16.51 sec Dynamic Gait Index: 12/24 SLS 5"  right and 5"  with light UE support DGI 1. Gait level surface (2) Mild Impairment: Walks 20', uses assistive devices, slower speed, mild gait deviations. 2. Change in gait speed (2) Mild Impairment: Is able to change speed but demonstrates mild gait deviations, or not gait deviations but unable to achieve a significant change in velocity, or uses an assistive device. 3. Gait with horizontal head turns (2) Mild Impairment: Performs head turns smoothly with slight change in gait velocity, i.e., minor disruption to smooth gait path or uses walking aid. 4. Gait with vertical head turns 1) Moderate Impairment: Performs head turns with moderate change in gait velocity, slows down, staggers but recovers, can continue to walk. 5. Gait and pivot turn (1) Moderate Impairment: Turns slowly, requires verbal cueing, requires several small steps to catch balance following turn and stop. 6. Step over obstacle (1) Moderate Impairment: Is able to step over box but must stop, then step over. May require verbal cueing. 7. Step around obstacles (1) Moderate Impairment: Is able to clear cones but must significantly slow, speed to accomplish task, or requires verbal cueing. 8. Stairs (2) Mild Impairment: Alternating feet, must use rail.  TOTAL SCORE: 12 / 24   PATIENT SURVEYS:  ABC scale 49.4%  VESTIBULAR ASSESSMENT:  GENERAL OBSERVATION: glasses, forward flexed trunk   SYMPTOM BEHAVIOR:  Subjective history: see above; glasses  Non-Vestibular symptoms: neck pain  Type of dizziness: Imbalance (Disequilibrium) and Lightheadedness/Faint  Frequency: varies  Duration: varies  Aggravating factors:  sitting in choir room then standing up to go upstairs  Relieving factors: rest  Progression of symptoms: unchanged  OCULOMOTOR EXAM:  Ocular Alignment: normal  Ocular ROM: No Limitations  Spontaneous Nystagmus: absent  Gaze-Induced Nystagmus: absent  Smooth Pursuits: intact  Saccades:  not  tested  Convergence/Divergence: 8 inches   VESTIBULAR - OCULAR REFLEX:   Slow VOR: Comment: not tested  VOR Cancellation: Comment: not tested  Head-Impulse Test: not tested  Dynamic Visual Acuity: not tested   POSITIONAL TESTING: Other: not tested  MOTION SENSITIVITY:  Motion Sensitivity Quotient Intensity: 0 = none, 1 = Lightheaded, 2 = Mild, 3 = Moderate, 4 = Severe, 5 = Vomiting  Intensity  1. Sitting to supine   2. Supine to L side   3. Supine to R side   4. Supine to sitting   5. L Hallpike-Dix   6. Up from L    7. R Hallpike-Dix   8. Up from R    9. Sitting, head tipped to L knee   10. Head up from L knee   11. Sitting, head tipped to R knee   12. Head up from R knee   13. Sitting head turns x5   14.Sitting head nods x5   15. In stance, 180 turn to L  16. In stance, 180 turn to R     OTHOSTATICS: sitting 152/78; standing 142/76  FUNCTIONAL GAIT: see above                                                                                                                             TREATMENT DATE: 12/30/23 physical therapy evaluation and HEP instruction   Canalith Repositioning:   Gaze Adaptation:   Habituation:   Other: see below  PATIENT EDUCATION: Education details: Patient educated on exam findings, POC, scope of PT, HEP, and what to expect next visit. Person educated: Patient Education method: Explanation, Demonstration, and Handouts Education comprehension: verbalized understanding, returned demonstration, verbal cues required, and tactile cues required  HOME EXERCISE PROGRAM: Access Code: [email or web address] URL: [email or web address] Date: 12/30/2023 Prepared by: [facility]  Exercises - Cervical Extension AROM with Strap  - 2 x daily - 7 x weekly - 1 sets - 10 reps - Seated Scapular Retraction  - 2 x daily - 7 x weekly - 1 sets - 10 reps - standing single leg balance at the counter (try to not hold on)  - 2 x daily - 7 x weekly - 1 sets  - 10 reps - 15 sec hold Pencil push ups GOALS: Goals reviewed with patient? No  SHORT TERM GOALS: Target date: 01/30/2024  patient will be independent with initial HEP  Baseline: Goal status: INITIAL  2.  Patient will report 50% improvement overall  Baseline:  Goal status: INITIAL  3.  Patient will be able to stand SLS x 5" without UE support each leg to demonstrate improved functional balance Baseline: 5" each with light UE support Goal status: INITIAL   LONG TERM GOALS: Target date: 02/13/2024  Patient will be independent in self management strategies to improve quality of life and functional outcomes.  Baseline:  Goal status: INITIAL  2.  Patient will report 75% improvement overall  Baseline:  Goal status: INITIAL  3.  Patient will be able to stand SLS x 10" without UE support each leg to demonstrate improved functional balance Baseline:  Goal status: INITIAL  4.  Patient will improve DGI score by 5 points to demonstrate improve functional balance and gait Baseline: 12/24 Goal status: INITIAL  5.  Patient will remain free of falls Baseline:  Goal status: INITIAL  ASSESSMENT:  CLINICAL IMPRESSION: Patient is a 78 y.o. male who was seen today for physical therapy evaluation and treatment for dizziness and giddiness. Patient demonstrates decreased spinal mobility; balance deficits and gait abnormalities which are negatively impacting patient ability to perform ADLs and functional mobility tasks. Patient does not appear to have BPPV.  Patient will benefit from skilled physical therapy services to address these deficits to improve level of function with ADLs, functional mobility tasks, and reduce risk for falls.    OBJECTIVE IMPAIRMENTS: Abnormal gait, decreased activity tolerance, decreased balance, decreased knowledge of condition, decreased mobility, difficulty walking, decreased ROM, and impaired perceived functional ability.  ACTIVITY LIMITATIONS: bending,  standing, stairs, transfers, and locomotion level  PARTICIPATION LIMITATIONS: shopping and community activity  REHAB POTENTIAL: Good  CLINICAL DECISION MAKING: Evolving/moderate complexity  EVALUATION COMPLEXITY: Moderate   PLAN:  PT FREQUENCY: 2x/week  PT DURATION: 4 weeks  PLANNED INTERVENTIONS: 97164- PT Re-evaluation, 97110-Therapeutic exercises, 97530- Therapeutic activity, 97112- Neuromuscular re-education, 97535- Self Care, 16109- Manual therapy, [phone]- Gait training, [phone]- Orthotic Fit/training, [phone]- Canalith repositioning, [id number]- Aquatic Therapy, [phone]- Splinting, Patient/Family education, Balance training, Stair training, Taping, Dry Needling, Joint mobilization, Joint manipulation, Spinal manipulation, Spinal mobilization, Scar mobilization, and DME instructions.   PLAN FOR NEXT SESSION: Review HEP and goals; starting 5/5 as patient is going out of town.  Balance; cervical mobility and postural strengthening.  Check leg strength; check vestibular system further   3:20 PM, 12/30/23 Rettie Laird Small Sitlali Koerner MPT [facility] [place] [phone]

## 2023-12-30 ENCOUNTER — Other Ambulatory Visit: Payer: Self-pay

## 2023-12-30 ENCOUNTER — Ambulatory Visit (HOSPITAL_COMMUNITY): Attending: Internal Medicine

## 2023-12-30 DIAGNOSIS — R2689 Other abnormalities of gait and mobility: Secondary | ICD-10-CM | POA: Diagnosis not present

## 2023-12-30 DIAGNOSIS — R42 Dizziness and giddiness: Secondary | ICD-10-CM | POA: Insufficient documentation

## 2023-12-31 DIAGNOSIS — M62830 Muscle spasm of back: Secondary | ICD-10-CM | POA: Diagnosis not present

## 2023-12-31 DIAGNOSIS — M546 Pain in thoracic spine: Secondary | ICD-10-CM | POA: Diagnosis not present

## 2023-12-31 DIAGNOSIS — M9902 Segmental and somatic dysfunction of thoracic region: Secondary | ICD-10-CM | POA: Diagnosis not present

## 2023-12-31 DIAGNOSIS — M9901 Segmental and somatic dysfunction of cervical region: Secondary | ICD-10-CM | POA: Diagnosis not present

## 2023-12-31 DIAGNOSIS — M9903 Segmental and somatic dysfunction of lumbar region: Secondary | ICD-10-CM | POA: Diagnosis not present

## 2023-12-31 DIAGNOSIS — M542 Cervicalgia: Secondary | ICD-10-CM | POA: Diagnosis not present

## 2024-01-14 DIAGNOSIS — M9901 Segmental and somatic dysfunction of cervical region: Secondary | ICD-10-CM | POA: Diagnosis not present

## 2024-01-14 DIAGNOSIS — M62830 Muscle spasm of back: Secondary | ICD-10-CM | POA: Diagnosis not present

## 2024-01-14 DIAGNOSIS — M9903 Segmental and somatic dysfunction of lumbar region: Secondary | ICD-10-CM | POA: Diagnosis not present

## 2024-01-14 DIAGNOSIS — M546 Pain in thoracic spine: Secondary | ICD-10-CM | POA: Diagnosis not present

## 2024-01-14 DIAGNOSIS — M9902 Segmental and somatic dysfunction of thoracic region: Secondary | ICD-10-CM | POA: Diagnosis not present

## 2024-01-14 DIAGNOSIS — M542 Cervicalgia: Secondary | ICD-10-CM | POA: Diagnosis not present

## 2024-01-16 ENCOUNTER — Ambulatory Visit (HOSPITAL_COMMUNITY): Attending: Internal Medicine

## 2024-01-16 DIAGNOSIS — R2689 Other abnormalities of gait and mobility: Secondary | ICD-10-CM | POA: Diagnosis not present

## 2024-01-16 DIAGNOSIS — R42 Dizziness and giddiness: Secondary | ICD-10-CM | POA: Diagnosis not present

## 2024-01-16 NOTE — Therapy (Signed)
 OUTPATIENT PHYSICAL THERAPY VESTIBULAR TREATMENT     Patient Name: Greg Mann MRN: [id number] DOB:[birth year], 78 y.o., male Today's Date: 01/16/2024  END OF SESSION:  PT End of Session - 01/16/24 1429     Visit Number 2    Number of Visits 8    Date for PT Re-Evaluation 02/13/24    Authorization Type HTA    PT Start Time 1429    PT Stop Time 1510    PT Time Calculation (min) 41 min    Activity Tolerance Patient tolerated treatment well    Behavior During Therapy [facility] for tasks assessed/performed             Past Medical History:  Diagnosis Date   Borderline hyperlipidemia    Chest pain    Dysrhythmia    Exercise-induced tachycardia    Ganglion    left wrist   GERD (gastroesophageal reflux disease)    Thrombocytopenia (HCC)    borderline   Past Surgical History:  Procedure Laterality Date   COLONOSCOPY N/A 04/22/2018   Procedure: COLONOSCOPY;  Surgeon: Suzette Espy, MD;  Location: [facility];  Service: Endoscopy;  Laterality: N/A;  8:30   GANGLION CYST EXCISION     Left Wrist   KNEE ARTHROSCOPY     Left   TONSILLECTOMY     Patient Active Problem List   Diagnosis Date Noted   RLL pneumonia 03/10/2022   Hypoalbuminemia 03/10/2022   Normocytic anemia 03/10/2022   Atrial flutter (HCC) 09/22/2013   Paroxysmal atrial fibrillation (HCC) 04/23/2013   Rapid palpitations 02/26/2013   FATIGUE 10/24/2009   THROMBOCYTOPENIA 10/11/2009   Chest pain 10/11/2009    PCP: Omie Bickers, MD REFERRING PROVIDER: Omie Bickers, MD  REFERRING DIAG: dizziness and giddiness  THERAPY DIAG:  Dizziness and giddiness  Other abnormalities of gait and mobility  ONSET DATE: 2 months or more  Rationale for Evaluation and Treatment: Rehabilitation  SUBJECTIVE:   SUBJECTIVE STATEMENT: Returns from trip out of town; reports compliance with HEP; hardest exercise is the single leg stance  Eval: After sitting a while he gets lightheaded; seems to trip himself; did have  a fall forward about 2 months ago. He thinks he doesn't seem to have any balance issues.  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 No  PRECAUTIONS: Fall    WEIGHT BEARING RESTRICTIONS: No  FALLS: Has patient fallen in last 6 months? Yes. Number of falls 1   PLOF: Independent  PATIENT GOALS: help with my balance  OBJECTIVE:  Note: Objective measures were completed at Evaluation unless otherwise noted.  DIAGNOSTIC FINDINGS: none  COGNITION: Overall cognitive status: Within functional limits for tasks assessed   SENSATION: WFL    POSTURE:  rounded shoulders, forward head, and flexed trunk  unable to fully stand upright; limited lumbar extension  Cervical ROM:  limited cervical extension and rotation  Active AROM (deg) 01/16/24  Flexion 27  Extension 35  Right lateral flexion 22  Left lateral flexion 12  Right rotation 55  Left rotation 40  (Blank rows = not tested)  STRENGTH: not tested  LOWER EXTREMITY MMT:   MMT Right 01/16/24 Left 01/16/24  Hip flexion 4+ 4+  Hip abduction    Hip adduction    Hip internal rotation    Hip external rotation  Knee flexion    Knee extension 5 5  Ankle dorsiflexion 5 5  Ankle plantarflexion    Ankle inversion    Ankle eversion    (Blank rows = not tested)  BED MOBILITY:  Not tested  TRANSFERS: Assistive device utilized: None  Sit to stand: Modified independence Stand to sit: Modified independence Chair to chair: Modified independence Floor: not tested  GAIT: Gait pattern: decreased arm swing- Right, decreased arm swing- Left, trunk flexed, poor foot clearance- Right, and poor foot clearance- Left Distance walked: 50 ft in clinic Assistive device utilized: None Level of assistance: Modified independence Comments: difficulty with turns; direction changes  FUNCTIONAL  TESTS:  5 times sit to stand: 16.51 sec Dynamic Gait Index: 12/24 SLS 5" right and 5"  with light UE support DGI 1. Gait level surface (2) Mild Impairment: Walks 20', uses assistive devices, slower speed, mild gait deviations. 2. Change in gait speed (2) Mild Impairment: Is able to change speed but demonstrates mild gait deviations, or not gait deviations but unable to achieve a significant change in velocity, or uses an assistive device. 3. Gait with horizontal head turns (2) Mild Impairment: Performs head turns smoothly with slight change in gait velocity, i.e., minor disruption to smooth gait path or uses walking aid. 4. Gait with vertical head turns 1) Moderate Impairment: Performs head turns with moderate change in gait velocity, slows down, staggers but recovers, can continue to walk. 5. Gait and pivot turn (1) Moderate Impairment: Turns slowly, requires verbal cueing, requires several small steps to catch balance following turn and stop. 6. Step over obstacle (1) Moderate Impairment: Is able to step over box but must stop, then step over. May require verbal cueing. 7. Step around obstacles (1) Moderate Impairment: Is able to clear cones but must significantly slow, speed to accomplish task, or requires verbal cueing. 8. Stairs (2) Mild Impairment: Alternating feet, must use rail.  TOTAL SCORE: 12 / 24   PATIENT SURVEYS:  ABC scale 49.4%  VESTIBULAR ASSESSMENT:  GENERAL OBSERVATION: glasses, forward flexed trunk   SYMPTOM BEHAVIOR:  Subjective history: see above; glasses  Non-Vestibular symptoms: neck pain  Type of dizziness: Imbalance (Disequilibrium) and Lightheadedness/Faint  Frequency: varies  Duration: varies  Aggravating factors: sitting in choir room then standing up to go upstairs  Relieving factors: rest  Progression of symptoms: unchanged  OCULOMOTOR EXAM:  Ocular Alignment: normal  Ocular ROM: No Limitations  Spontaneous Nystagmus: absent  Gaze-Induced  Nystagmus: absent  Smooth Pursuits: intact  Saccades: intact  Convergence/Divergence: 8 inches   VESTIBULAR - OCULAR REFLEX:   Slow VOR: Comment: not tested  VOR Cancellation: Comment: not tested  Head-Impulse Test: not tested  Dynamic Visual Acuity: not tested   POSITIONAL TESTING: Other: not tested  MOTION SENSITIVITY:  Motion Sensitivity Quotient Intensity: 0 = none, 1 = Lightheaded, 2 = Mild, 3 = Moderate, 4 = Severe, 5 = Vomiting  Intensity  1. Sitting to supine   2. Supine to L side   3. Supine to R side   4. Supine to sitting   5. L Hallpike-Dix   6. Up from L    7. R Hallpike-Dix   8. Up from R    9. Sitting, head tipped to L knee 0  10. Head up from L knee 0  11. Sitting, head tipped to R knee 0  12. Head up from R knee 0  13. Sitting head turns x5 0  14.Sitting head nods x5 0  15. In  stance, 180 turn to L    16. In stance, 180 turn to R     OTHOSTATICS: sitting 152/78; standing 142/76  FUNCTIONAL GAIT: see above                                                                                                                             TREATMENT DATE:  01/16/24 Review of HEP and goals AROM of Cervical spine see above MMT's of LE's see above Seated cervical extension with towel x 10 Scapular retractions 5" hold x 10 Shoulder shrugs x 5 Thoracic extension over the back of the chair x 10 Cervical rotation and nods x  5 each Nose to knee x 5 each Gaze stabilization with 1 target head turns Corner stretch 5 x 10" Standing against the wall x 30"     12/30/23 physical therapy evaluation and HEP instruction   Canalith Repositioning:   Gaze Adaptation:   Habituation:   Other: see below  PATIENT EDUCATION: Education details: Patient educated on exam findings, POC, scope of PT, HEP, and what to expect next visit. Person educated: Patient Education method: Explanation, Demonstration, and Handouts Education comprehension: verbalized understanding,  returned demonstration, verbal cues required, and tactile cues required  HOME EXERCISE PROGRAM: Access Code: [email or web address] URL: [email or web address] Date: 01/16/2024 Prepared by: [facility] - [facility]  Exercises - Cervical Extension AROM with Strap  - 2 x daily - 7 x weekly - 1 sets - 10 reps - Seated Scapular Retraction  - 2 x daily - 7 x weekly - 1 sets - 10 reps - standing single leg balance at the counter (try to not hold on)  - 2 x daily - 7 x weekly - 1 sets - 10 reps - 15 sec hold - Seated Thoracic Lumbar Extension  - 2 x daily - 7 x weekly - 1 sets - 10 reps - Corner Pec Major Stretch  - 2 x daily - 7 x weekly - 1 sets - 5 reps - 10 sec hold - Seated Assisted Cervical Rotation with Towel  - 2 x daily - 7 x weekly - 1 sets - 10 reps - 10 sec hold - Seated Shoulder Shrugs  - 2 x daily - 7 x weekly - 2 sets - 10 reps  Access Code: [email or web address] URL: [email or web address] Date: 12/30/2023 Prepared by: [facility]  Exercises - Cervical Extension AROM with Strap  - 2 x daily - 7 x weekly - 1 sets - 10 reps - Seated Scapular Retraction  - 2 x daily - 7 x weekly - 1 sets - 10 reps - standing single leg balance at the counter (try to not hold on)  - 2 x daily - 7 x weekly - 1 sets - 10 reps - 15 sec hold Pencil push ups GOALS: Goals reviewed with patient? No  SHORT TERM GOALS: Target date: 01/30/2024  patient will be independent with initial HEP  Baseline: Goal status: IN PROGRESS  2.  Patient will report 50% improvement overall  Baseline:  Goal status: IN PROGRESS  3.  Patient will be able to stand SLS x 5" without UE support each leg to demonstrate improved functional balance Baseline: 5" each with light UE support Goal status: IN PROGRESS   LONG TERM GOALS: Target date: 02/13/2024  Patient will be independent in self management strategies to improve quality of life and functional outcomes.  Baseline:  Goal status: IN PROGRESS  2.  Patient will report 75%  improvement overall  Baseline:  Goal status: IN PROGRESS  3.  Patient will be able to stand SLS x 10" without UE support each leg to demonstrate improved functional balance Baseline:  Goal status: IN PROGRESS  4.  Patient will improve DGI score by 5 points to demonstrate improve functional balance and gait Baseline: 12/24 Goal status: IN PROGRESS  5.  Patient will remain free of falls Baseline:  Goal status: IN PROGRESS  ASSESSMENT:  CLINICAL IMPRESSION: Today's session started with a review of HEP and goals; patient verbalizes agreement with set rehab goals.  Patient with noted cervical tightness; he also has trouble with shoulder shrugs and scapular retractions and needs moderate verbal and physical cues to perform.  No issues with saccades or gaze stabilization today noted.  No dizziness with head turns, nods or with nose to knee.  Continues with forward flexed trunk, rounded shoulders and forward head.  Patient unable to stand up straight with back against the wall and touch the back of his head or shoulders to the wall. Appears to be more cervical and postural issues causing balance impairment.  Updated HEP.   Patient will benefit from continued skilled therapy services to address deficits and promote return to optimal function.       Eval:Patient is a 78 y.o. male who was seen today for physical therapy evaluation and treatment for dizziness and giddiness. Patient demonstrates decreased spinal mobility; balance deficits and gait abnormalities which are negatively impacting patient ability to perform ADLs and functional mobility tasks. Patient does not appear to have BPPV.  Patient will benefit from skilled physical therapy services to address these deficits to improve level of function with ADLs, functional mobility tasks, and reduce risk for falls.    OBJECTIVE IMPAIRMENTS: Abnormal gait, decreased activity tolerance, decreased balance, decreased knowledge of condition, decreased  mobility, difficulty walking, decreased ROM, and impaired perceived functional ability.   ACTIVITY LIMITATIONS: bending, standing, stairs, transfers, and locomotion level  PARTICIPATION LIMITATIONS: shopping and community activity  REHAB POTENTIAL: Good  CLINICAL DECISION MAKING: Evolving/moderate complexity  EVALUATION COMPLEXITY: Moderate   PLAN:  PT FREQUENCY: 2x/week  PT DURATION: 4 weeks  PLANNED INTERVENTIONS: 97164- PT Re-evaluation, 97110-Therapeutic exercises, 97530- Therapeutic activity, 97112- Neuromuscular re-education, 97535- Self Care, 95284- Manual therapy, [phone]- Gait training, [phone]- Orthotic Fit/training, [phone]- Canalith repositioning, [id number]- Aquatic Therapy, [phone]- Splinting, Patient/Family education, Balance training, Stair training, Taping, Dry Needling, Joint mobilization, Joint manipulation, Spinal manipulation, Spinal mobilization, Scar mobilization, and DME instructions.   PLAN FOR NEXT SESSION:   Balance; cervical mobility and postural strengthening.  STM to cervical spine  3:12 PM, 01/16/24 Archie Atilano Small Jaida Basurto MPT [facility] [place] [phone]

## 2024-01-22 ENCOUNTER — Ambulatory Visit (HOSPITAL_COMMUNITY)

## 2024-01-22 DIAGNOSIS — R2689 Other abnormalities of gait and mobility: Secondary | ICD-10-CM

## 2024-01-22 DIAGNOSIS — R42 Dizziness and giddiness: Secondary | ICD-10-CM | POA: Diagnosis not present

## 2024-01-22 NOTE — Therapy (Signed)
 OUTPATIENT PHYSICAL THERAPY VESTIBULAR TREATMENT     Patient Name: Greg Mann MRN: [id number] DOB:1946-03-31, 78 y.o., male Today's Date: 01/22/2024  END OF SESSION:  PT End of Session - 01/22/24 1350     Visit Number 3    Number of Visits 8    Date for PT Re-Evaluation 02/13/24    Authorization Type HTA    PT Start Time 1350    PT Stop Time 1430    PT Time Calculation (min) 40 min    Activity Tolerance Patient tolerated treatment well    Behavior During Therapy WFL for tasks assessed/performed             Past Medical History:  Diagnosis Date   Borderline hyperlipidemia    Chest pain    Dysrhythmia    Exercise-induced tachycardia    Ganglion    left wrist   GERD (gastroesophageal reflux disease)    Thrombocytopenia (HCC)    borderline   Past Surgical History:  Procedure Laterality Date   COLONOSCOPY N/A 04/22/2018   Procedure: COLONOSCOPY;  Surgeon: Suzette Espy, MD;  Location: [facility];  Service: Endoscopy;  Laterality: N/A;  8:30   GANGLION CYST EXCISION     Left Wrist   KNEE ARTHROSCOPY     Left   TONSILLECTOMY     Patient Active Problem List   Diagnosis Date Noted   RLL pneumonia 03/10/2022   Hypoalbuminemia 03/10/2022   Normocytic anemia 03/10/2022   Atrial flutter (HCC) 09/22/2013   Paroxysmal atrial fibrillation (HCC) 04/23/2013   Rapid palpitations 02/26/2013   FATIGUE 10/24/2009   THROMBOCYTOPENIA 10/11/2009   Chest pain 10/11/2009    PCP: Omie Bickers, MD REFERRING PROVIDER: Omie Bickers, MD  REFERRING DIAG: dizziness and giddiness  THERAPY DIAG:  Dizziness and giddiness  Other abnormalities of gait and mobility  ONSET DATE: 2 months or more  Rationale for Evaluation and Treatment: Rehabilitation  SUBJECTIVE:   SUBJECTIVE STATEMENT: No new dizziness; no falls; some pain in the neck; 3/10 pain in the neck  Eval: After sitting a while he gets lightheaded; seems to trip himself; did have a fall forward about 2  months ago. He thinks he doesn't seem to have any balance issues.  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 No  PRECAUTIONS: Fall    WEIGHT BEARING RESTRICTIONS: No  FALLS: Has patient fallen in last 6 months? Yes. Number of falls 1   PLOF: Independent  PATIENT GOALS: help with my balance  OBJECTIVE:  Note: Objective measures were completed at Evaluation unless otherwise noted.  DIAGNOSTIC FINDINGS: none  COGNITION: Overall cognitive status: Within functional limits for tasks assessed   SENSATION: WFL    POSTURE:  rounded shoulders, forward head, and flexed trunk  unable to fully stand upright; limited lumbar extension  Cervical ROM:  limited cervical extension and rotation  Active AROM (deg) 01/16/24  Flexion 27  Extension 35  Right lateral flexion 22  Left lateral flexion 12  Right rotation 55  Left rotation 40  (Blank rows = not tested)  STRENGTH: not tested  LOWER EXTREMITY MMT:   MMT Right 01/16/24 Left 01/16/24  Hip flexion 4+ 4+  Hip abduction    Hip adduction    Hip internal rotation    Hip external rotation    Knee flexion  Knee extension 5 5  Ankle dorsiflexion 5 5  Ankle plantarflexion    Ankle inversion    Ankle eversion    (Blank rows = not tested)  BED MOBILITY:  Not tested  TRANSFERS: Assistive device utilized: None  Sit to stand: Modified independence Stand to sit: Modified independence Chair to chair: Modified independence Floor: not tested  GAIT: Gait pattern: decreased arm swing- Right, decreased arm swing- Left, trunk flexed, poor foot clearance- Right, and poor foot clearance- Left Distance walked: 50 ft in clinic Assistive device utilized: None Level of assistance: Modified independence Comments: difficulty with turns; direction changes  FUNCTIONAL TESTS:  5 times sit to  stand: 16.51 sec Dynamic Gait Index: 12/24 SLS 5" right and 5"  with light UE support DGI 1. Gait level surface (2) Mild Impairment: Walks 20', uses assistive devices, slower speed, mild gait deviations. 2. Change in gait speed (2) Mild Impairment: Is able to change speed but demonstrates mild gait deviations, or not gait deviations but unable to achieve a significant change in velocity, or uses an assistive device. 3. Gait with horizontal head turns (2) Mild Impairment: Performs head turns smoothly with slight change in gait velocity, i.e., minor disruption to smooth gait path or uses walking aid. 4. Gait with vertical head turns 1) Moderate Impairment: Performs head turns with moderate change in gait velocity, slows down, staggers but recovers, can continue to walk. 5. Gait and pivot turn (1) Moderate Impairment: Turns slowly, requires verbal cueing, requires several small steps to catch balance following turn and stop. 6. Step over obstacle (1) Moderate Impairment: Is able to step over box but must stop, then step over. May require verbal cueing. 7. Step around obstacles (1) Moderate Impairment: Is able to clear cones but must significantly slow, speed to accomplish task, or requires verbal cueing. 8. Stairs (2) Mild Impairment: Alternating feet, must use rail.  TOTAL SCORE: 12 / 24   PATIENT SURVEYS:  ABC scale 49.4%  VESTIBULAR ASSESSMENT:  GENERAL OBSERVATION: glasses, forward flexed trunk   SYMPTOM BEHAVIOR:  Subjective history: see above; glasses  Non-Vestibular symptoms: neck pain  Type of dizziness: Imbalance (Disequilibrium) and Lightheadedness/Faint  Frequency: varies  Duration: varies  Aggravating factors: sitting in choir room then standing up to go upstairs  Relieving factors: rest  Progression of symptoms: unchanged  OCULOMOTOR EXAM:  Ocular Alignment: normal  Ocular ROM: No Limitations  Spontaneous Nystagmus: absent  Gaze-Induced Nystagmus:  absent  Smooth Pursuits: intact  Saccades: intact  Convergence/Divergence: 8 inches   VESTIBULAR - OCULAR REFLEX:   Slow VOR: Comment: not tested  VOR Cancellation: Comment: not tested  Head-Impulse Test: not tested  Dynamic Visual Acuity: not tested   POSITIONAL TESTING: Other: not tested  MOTION SENSITIVITY:  Motion Sensitivity Quotient Intensity: 0 = none, 1 = Lightheaded, 2 = Mild, 3 = Moderate, 4 = Severe, 5 = Vomiting  Intensity  1. Sitting to supine   2. Supine to L side   3. Supine to R side   4. Supine to sitting   5. L Hallpike-Dix   6. Up from L    7. R Hallpike-Dix   8. Up from R    9. Sitting, head tipped to L knee 0  10. Head up from L knee 0  11. Sitting, head tipped to R knee 0  12. Head up from R knee 0  13. Sitting head turns x5 0  14.Sitting head nods x5 0  15. In stance, 180 turn to L  16. In stance, 180 turn to R     OTHOSTATICS: sitting 152/78; standing 142/76  FUNCTIONAL GAIT: see above                                                                                                                             TREATMENT DATE:  01/22/24 Sitting  Moist heat to cervical spine x 5' to decrease pain and tissue tightness STM x 10 to cervical spine and parapsinals; upper traps to decrease tightness and improve cervical mobility Cervical extension using a towel x 10 Cervical AAROM rotation with towel x 5 each way Upper trap stretch 3 x 10" each Thoracic extension over the chair x 10 Scapular retraction 5" hold x 10    01/16/24 Review of HEP and goals AROM of Cervical spine see above MMT's of LE's see above Seated cervical extension with towel x 10 Scapular retractions 5" hold x 10 Shoulder shrugs x 5 Thoracic extension over the back of the chair x 10 Cervical rotation and nods x  5 each Nose to knee x 5 each Gaze stabilization with 1 target head turns Corner stretch 5 x 10" Standing against the wall x 30"     12/30/23 physical  therapy evaluation and HEP instruction   Canalith Repositioning:   Gaze Adaptation:   Habituation:   Other: see below  PATIENT EDUCATION: Education details: Patient educated on exam findings, POC, scope of PT, HEP, and what to expect next visit. Person educated: Patient Education method: Explanation, Demonstration, and Handouts Education comprehension: verbalized understanding, returned demonstration, verbal cues required, and tactile cues required  HOME EXERCISE PROGRAM: 01/22/24 Upper trap stretch  Access Code: [email or web address] URL: [email or web address] Date: 01/16/2024 Prepared by: [facility]  Exercises - Cervical Extension AROM with Strap  - 2 x daily - 7 x weekly - 1 sets - 10 reps - Seated Scapular Retraction  - 2 x daily - 7 x weekly - 1 sets - 10 reps - standing single leg balance at the counter (try to not hold on)  - 2 x daily - 7 x weekly - 1 sets - 10 reps - 15 sec hold - Seated Thoracic Lumbar Extension  - 2 x daily - 7 x weekly - 1 sets - 10 reps - Corner Pec Major Stretch  - 2 x daily - 7 x weekly - 1 sets - 5 reps - 10 sec hold - Seated Assisted Cervical Rotation with Towel  - 2 x daily - 7 x weekly - 1 sets - 10 reps - 10 sec hold - Seated Shoulder Shrugs  - 2 x daily - 7 x weekly - 2 sets - 10 reps  Access Code: [email or web address] URL: [email or web address] Date: 12/30/2023 Prepared by: [facility]  Exercises - Cervical Extension AROM with Strap  - 2 x daily - 7 x weekly - 1 sets - 10 reps - Seated Scapular Retraction  - 2 x daily - 7 x weekly -  1 sets - 10 reps - standing single leg balance at the counter (try to not hold on)  - 2 x daily - 7 x weekly - 1 sets - 10 reps - 15 sec hold Pencil push ups GOALS: Goals reviewed with patient? No  SHORT TERM GOALS: Target date: 01/30/2024  patient will be independent with initial HEP  Baseline: Goal status: IN PROGRESS  2.  Patient will report 50% improvement overall  Baseline:  Goal status:  IN PROGRESS  3.  Patient will be able to stand SLS x 5" without UE support each leg to demonstrate improved functional balance Baseline: 5" each with light UE support Goal status: IN PROGRESS   LONG TERM GOALS: Target date: 02/13/2024  Patient will be independent in self management strategies to improve quality of life and functional outcomes.  Baseline:  Goal status: IN PROGRESS  2.  Patient will report 75% improvement overall  Baseline:  Goal status: IN PROGRESS  3.  Patient will be able to stand SLS x 10" without UE support each leg to demonstrate improved functional balance Baseline:  Goal status: IN PROGRESS  4.  Patient will improve DGI score by 5 points to demonstrate improve functional balance and gait Baseline: 12/24 Goal status: IN PROGRESS  5.  Patient will remain free of falls Baseline:  Goal status: IN PROGRESS  ASSESSMENT:  CLINICAL IMPRESSION: Today's session started with moist heat to cervical spine x 5' followed by Soft tissue massage to cervical spine and upper traps to decrease neck pain and improve mobility.  Patient did not understanding coordination of cervical rotation stretch with towel so reviewed that with him until he was proficient in performing back to therapist.  Added upper trap stretch as patient upper traps are very tight.  Feel patients forward flexed posturing is a significant portion of his balance impairment. Needs manual cues for correct form with scapular retractions but range is still limited/weak.    Patient will benefit from continued skilled therapy services to address deficits and promote return to optimal function.       Eval:Patient is a 78 y.o. male who was seen today for physical therapy evaluation and treatment for dizziness and giddiness. Patient demonstrates decreased spinal mobility; balance deficits and gait abnormalities which are negatively impacting patient ability to perform ADLs and functional mobility tasks. Patient does  not appear to have BPPV.  Patient will benefit from skilled physical therapy services to address these deficits to improve level of function with ADLs, functional mobility tasks, and reduce risk for falls.    OBJECTIVE IMPAIRMENTS: Abnormal gait, decreased activity tolerance, decreased balance, decreased knowledge of condition, decreased mobility, difficulty walking, decreased ROM, and impaired perceived functional ability.   ACTIVITY LIMITATIONS: bending, standing, stairs, transfers, and locomotion level  PARTICIPATION LIMITATIONS: shopping and community activity  REHAB POTENTIAL: Good  CLINICAL DECISION MAKING: Evolving/moderate complexity  EVALUATION COMPLEXITY: Moderate   PLAN:  PT FREQUENCY: 2x/week  PT DURATION: 4 weeks  PLANNED INTERVENTIONS: 97164- PT Re-evaluation, 97110-Therapeutic exercises, 97530- Therapeutic activity, 97112- Neuromuscular re-education, 97535- Self Care, 16109- Manual therapy, [phone]- Gait training, [phone]- Orthotic Fit/training, [phone]- Canalith repositioning, [id number]- Aquatic Therapy, [phone]- Splinting, Patient/Family education, Balance training, Stair training, Taping, Dry Needling, Joint mobilization, Joint manipulation, Spinal manipulation, Spinal mobilization, Scar mobilization, and DME instructions.   PLAN FOR NEXT SESSION:   Balance; cervical mobility and postural strengthening.  STM to cervical spine  1:51 PM, 01/22/24 Nathanyel Defenbaugh Small Aayat Hajjar MPT [place] [facility] [place] [phone]

## 2024-01-28 ENCOUNTER — Ambulatory Visit (HOSPITAL_COMMUNITY)

## 2024-01-28 DIAGNOSIS — R42 Dizziness and giddiness: Secondary | ICD-10-CM

## 2024-01-28 DIAGNOSIS — R2689 Other abnormalities of gait and mobility: Secondary | ICD-10-CM

## 2024-01-28 NOTE — Therapy (Signed)
 OUTPATIENT PHYSICAL THERAPY VESTIBULAR TREATMENT     Patient Name: Greg Mann MRN: [id number] DOB:[birth year], 78 y.o., male Today's Date: 01/28/2024  END OF SESSION:  PT End of Session - 01/28/24 1436     Visit Number 4    Number of Visits 8    Date for PT Re-Evaluation 02/13/24    Authorization Type HTA    PT Start Time 1435    PT Stop Time 1515    PT Time Calculation (min) 40 min    Activity Tolerance Patient tolerated treatment well    Behavior During Therapy [facility] for tasks assessed/performed             Past Medical History:  Diagnosis Date   Borderline hyperlipidemia    Chest pain    Dysrhythmia    Exercise-induced tachycardia    Ganglion    left wrist   GERD (gastroesophageal reflux disease)    Thrombocytopenia (HCC)    borderline   Past Surgical History:  Procedure Laterality Date   COLONOSCOPY N/A 04/22/2018   Procedure: COLONOSCOPY;  Surgeon: Suzette Espy, MD;  Location: [facility];  Service: Endoscopy;  Laterality: N/A;  8:30   GANGLION CYST EXCISION     Left Wrist   KNEE ARTHROSCOPY     Left   TONSILLECTOMY     Patient Active Problem List   Diagnosis Date Noted   RLL pneumonia 03/10/2022   Hypoalbuminemia 03/10/2022   Normocytic anemia 03/10/2022   Atrial flutter (HCC) 09/22/2013   Paroxysmal atrial fibrillation (HCC) 04/23/2013   Rapid palpitations 02/26/2013   FATIGUE 10/24/2009   THROMBOCYTOPENIA 10/11/2009   Chest pain 10/11/2009    PCP: Omie Bickers, MD REFERRING PROVIDER: Omie Bickers, MD  REFERRING DIAG: dizziness and giddiness  THERAPY DIAG:  Dizziness and giddiness  Other abnormalities of gait and mobility  ONSET DATE: 2 months or more  Rationale for Evaluation and Treatment: Rehabilitation  SUBJECTIVE:   SUBJECTIVE STATEMENT: Noticed that stress increases his neck pain; its aching today maybe a 2/10  Eval: After sitting a while he gets lightheaded; seems to trip himself; did have a fall forward about 2  months ago. He thinks he doesn't seem to have any balance issues.  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 No  PRECAUTIONS: Fall    WEIGHT BEARING RESTRICTIONS: No  FALLS: Has patient fallen in last 6 months? Yes. Number of falls 1   PLOF: Independent  PATIENT GOALS: help with my balance  OBJECTIVE:  Note: Objective measures were completed at Evaluation unless otherwise noted.  DIAGNOSTIC FINDINGS: none  COGNITION: Overall cognitive status: Within functional limits for tasks assessed   SENSATION: WFL    POSTURE:  rounded shoulders, forward head, and flexed trunk  unable to fully stand upright; limited lumbar extension  Cervical ROM:  limited cervical extension and rotation  Active AROM (deg) 01/16/24  Flexion 27  Extension 35  Right lateral flexion 22  Left lateral flexion 12  Right rotation 55  Left rotation 40  (Blank rows = not tested)  STRENGTH: not tested  LOWER EXTREMITY MMT:   MMT Right 01/16/24 Left 01/16/24  Hip flexion 4+ 4+  Hip abduction    Hip adduction    Hip internal rotation    Hip external rotation    Knee flexion  Knee extension 5 5  Ankle dorsiflexion 5 5  Ankle plantarflexion    Ankle inversion    Ankle eversion    (Blank rows = not tested)  BED MOBILITY:  Not tested  TRANSFERS: Assistive device utilized: None  Sit to stand: Modified independence Stand to sit: Modified independence Chair to chair: Modified independence Floor: not tested  GAIT: Gait pattern: decreased arm swing- Right, decreased arm swing- Left, trunk flexed, poor foot clearance- Right, and poor foot clearance- Left Distance walked: 50 ft in clinic Assistive device utilized: None Level of assistance: Modified independence Comments: difficulty with turns; direction changes  FUNCTIONAL TESTS:  5 times sit to  stand: 16.51 sec Dynamic Gait Index: 12/24 SLS 5" right and 5"  with light UE support DGI 1. Gait level surface (2) Mild Impairment: Walks 20', uses assistive devices, slower speed, mild gait deviations. 2. Change in gait speed (2) Mild Impairment: Is able to change speed but demonstrates mild gait deviations, or not gait deviations but unable to achieve a significant change in velocity, or uses an assistive device. 3. Gait with horizontal head turns (2) Mild Impairment: Performs head turns smoothly with slight change in gait velocity, i.e., minor disruption to smooth gait path or uses walking aid. 4. Gait with vertical head turns 1) Moderate Impairment: Performs head turns with moderate change in gait velocity, slows down, staggers but recovers, can continue to walk. 5. Gait and pivot turn (1) Moderate Impairment: Turns slowly, requires verbal cueing, requires several small steps to catch balance following turn and stop. 6. Step over obstacle (1) Moderate Impairment: Is able to step over box but must stop, then step over. May require verbal cueing. 7. Step around obstacles (1) Moderate Impairment: Is able to clear cones but must significantly slow, speed to accomplish task, or requires verbal cueing. 8. Stairs (2) Mild Impairment: Alternating feet, must use rail.  TOTAL SCORE: 12 / 24   PATIENT SURVEYS:  ABC scale 49.4%  VESTIBULAR ASSESSMENT:  GENERAL OBSERVATION: glasses, forward flexed trunk   SYMPTOM BEHAVIOR:  Subjective history: see above; glasses  Non-Vestibular symptoms: neck pain  Type of dizziness: Imbalance (Disequilibrium) and Lightheadedness/Faint  Frequency: varies  Duration: varies  Aggravating factors: sitting in choir room then standing up to go upstairs  Relieving factors: rest  Progression of symptoms: unchanged  OCULOMOTOR EXAM:  Ocular Alignment: normal  Ocular ROM: No Limitations  Spontaneous Nystagmus: absent  Gaze-Induced Nystagmus:  absent  Smooth Pursuits: intact  Saccades: intact  Convergence/Divergence: 8 inches   VESTIBULAR - OCULAR REFLEX:   Slow VOR: Comment: not tested  VOR Cancellation: Comment: not tested  Head-Impulse Test: not tested  Dynamic Visual Acuity: not tested   POSITIONAL TESTING: Other: not tested  MOTION SENSITIVITY:  Motion Sensitivity Quotient Intensity: 0 = none, 1 = Lightheaded, 2 = Mild, 3 = Moderate, 4 = Severe, 5 = Vomiting  Intensity  1. Sitting to supine   2. Supine to L side   3. Supine to R side   4. Supine to sitting   5. L Hallpike-Dix   6. Up from L    7. R Hallpike-Dix   8. Up from R    9. Sitting, head tipped to L knee 0  10. Head up from L knee 0  11. Sitting, head tipped to R knee 0  12. Head up from R knee 0  13. Sitting head turns x5 0  14.Sitting head nods x5 0  15. In stance, 180 turn to L  16. In stance, 180 turn to R     OTHOSTATICS: sitting 152/78; standing 142/76  FUNCTIONAL GAIT: see above                                                                                                                             TREATMENT DATE:  01/28/24 Nustep seat 10 x 5' dynamic warm up Sit to stand no UE assist x 5 Sit to stand to toe raise x 8 no UE assist Heel raises on incline 2 x 10 light UE hold Toe raises on decline 2 x 10 light UE hold Tandem stance 2 x 30" each Seated: Thoracic extension x 10 Scapular retractions 5" hold x 10 STM to bilateral Upper traps x 10' to decrease tightness of soft tissue    01/22/24 Sitting  Moist heat to cervical spine x 5' to decrease pain and tissue tightness STM x 10 to cervical spine and parapsinals; upper traps to decrease tightness and improve cervical mobility Cervical extension using a towel x 10 Cervical AAROM rotation with towel x 5 each way Upper trap stretch 3 x 10" each Thoracic extension over the chair x 10 Scapular retraction 5" hold x 10    01/16/24 Review of HEP and goals AROM of Cervical  spine see above MMT's of LE's see above Seated cervical extension with towel x 10 Scapular retractions 5" hold x 10 Shoulder shrugs x 5 Thoracic extension over the back of the chair x 10 Cervical rotation and nods x  5 each Nose to knee x 5 each Gaze stabilization with 1 target head turns Corner stretch 5 x 10" Standing against the wall x 30"     12/30/23 physical therapy evaluation and HEP instruction   Canalith Repositioning:   Gaze Adaptation:   Habituation:   Other: see below  PATIENT EDUCATION: Education details: Patient educated on exam findings, POC, scope of PT, HEP, and what to expect next visit. Person educated: Patient Education method: Explanation, Demonstration, and Handouts Education comprehension: verbalized understanding, returned demonstration, verbal cues required, and tactile cues required  HOME EXERCISE PROGRAM:' 01/28/24 - Heel Raises with Counter Support  - 1 x daily - 7 x weekly - 2 sets - 10 reps - Standing Ankle Plantar Flexion Dorsiflexion with Counter Support  - 1 x daily - 7 x weekly - 2 sets - 10 reps - tandem stance balance; try not to hold on  - 1 x daily - 7 x weekly - 1 sets - 5 reps - 30 sec hold 01/22/24 Upper trap stretch  Access Code: [email or web address] URL: [email or web address] Date: 01/16/2024 Prepared by: [facility]  Exercises - Cervical Extension AROM with Strap  - 2 x daily - 7 x weekly - 1 sets - 10 reps - Seated Scapular Retraction  - 2 x daily - 7 x weekly - 1 sets - 10 reps - standing single leg balance at the counter (try to not hold on)  -  2 x daily - 7 x weekly - 1 sets - 10 reps - 15 sec hold - Seated Thoracic Lumbar Extension  - 2 x daily - 7 x weekly - 1 sets - 10 reps - Corner Pec Major Stretch  - 2 x daily - 7 x weekly - 1 sets - 5 reps - 10 sec hold - Seated Assisted Cervical Rotation with Towel  - 2 x daily - 7 x weekly - 1 sets - 10 reps - 10 sec hold - Seated Shoulder Shrugs  - 2 x daily - 7 x weekly - 2  sets - 10 reps  Access Code: [email or web address] URL: [email or web address] Date: 12/30/2023 Prepared by: [facility]  Exercises - Cervical Extension AROM with Strap  - 2 x daily - 7 x weekly - 1 sets - 10 reps - Seated Scapular Retraction  - 2 x daily - 7 x weekly - 1 sets - 10 reps - standing single leg balance at the counter (try to not hold on)  - 2 x daily - 7 x weekly - 1 sets - 10 reps - 15 sec hold Pencil push ups GOALS: Goals reviewed with patient? No  SHORT TERM GOALS: Target date: 01/30/2024  patient will be independent with initial HEP  Baseline: Goal status: IN PROGRESS  2.  Patient will report 50% improvement overall  Baseline:  Goal status: IN PROGRESS  3.  Patient will be able to stand SLS x 5" without UE support each leg to demonstrate improved functional balance Baseline: 5" each with light UE support Goal status: IN PROGRESS   LONG TERM GOALS: Target date: 02/13/2024  Patient will be independent in self management strategies to improve quality of life and functional outcomes.  Baseline:  Goal status: IN PROGRESS  2.  Patient will report 75% improvement overall  Baseline:  Goal status: IN PROGRESS  3.  Patient will be able to stand SLS x 10" without UE support each leg to demonstrate improved functional balance Baseline:  Goal status: IN PROGRESS  4.  Patient will improve DGI score by 5 points to demonstrate improve functional balance and gait Baseline: 12/24 Goal status: IN PROGRESS  5.  Patient will remain free of falls Baseline:  Goal status: IN PROGRESS  ASSESSMENT:  CLINICAL IMPRESSION: Today's session started with dynamic warm up on the Nustep followed by focus on balance to start treatment.  Noted patient with curvature in the thoracic spine concave to the right. Patient tends to lean to the right with sitting as he relaxes/fatigues.  Patient needs light UE assist for balance with heel and toe raises; moderate challenge with tandem  stance. added balance exercises to HEP.    Patient will benefit from continued skilled therapy services to address deficits and promote return to optimal function.       Eval:Patient is a 78 y.o. male who was seen today for physical therapy evaluation and treatment for dizziness and giddiness. Patient demonstrates decreased spinal mobility; balance deficits and gait abnormalities which are negatively impacting patient ability to perform ADLs and functional mobility tasks. Patient does not appear to have BPPV.  Patient will benefit from skilled physical therapy services to address these deficits to improve level of function with ADLs, functional mobility tasks, and reduce risk for falls.    OBJECTIVE IMPAIRMENTS: Abnormal gait, decreased activity tolerance, decreased balance, decreased knowledge of condition, decreased mobility, difficulty walking, decreased ROM, and impaired perceived functional ability.   ACTIVITY LIMITATIONS: bending, standing, stairs, transfers, and  locomotion level  PARTICIPATION LIMITATIONS: shopping and community activity  REHAB POTENTIAL: Good  CLINICAL DECISION MAKING: Evolving/moderate complexity  EVALUATION COMPLEXITY: Moderate   PLAN:  PT FREQUENCY: 2x/week  PT DURATION: 4 weeks  PLANNED INTERVENTIONS: 97164- PT Re-evaluation, 97110-Therapeutic exercises, 97530- Therapeutic activity, 97112- Neuromuscular re-education, 97535- Self Care, 16109- Manual therapy, [phone]- Gait training, [phone]- Orthotic Fit/training, [phone]- Canalith repositioning, [id number]- Aquatic Therapy, [phone]- Splinting, Patient/Family education, Balance training, Stair training, Taping, Dry Needling, Joint mobilization, Joint manipulation, Spinal manipulation, Spinal mobilization, Scar mobilization, and DME instructions.   PLAN FOR NEXT SESSION:   Balance; cervical mobility and postural strengthening.  STM to cervical spine  3:14 PM, 01/28/24 Orine Goga Small Brandan Robicheaux MPT [facility] [place]  [phone]

## 2024-01-30 ENCOUNTER — Ambulatory Visit (HOSPITAL_COMMUNITY)

## 2024-01-30 DIAGNOSIS — R2689 Other abnormalities of gait and mobility: Secondary | ICD-10-CM

## 2024-01-30 DIAGNOSIS — R42 Dizziness and giddiness: Secondary | ICD-10-CM | POA: Diagnosis not present

## 2024-01-30 NOTE — Therapy (Signed)
 OUTPATIENT PHYSICAL THERAPY VESTIBULAR TREATMENT     Patient Name: Greg Mann MRN: [id number] DOB:[birth year], 78 y.o., male Today's Date: 01/30/2024  END OF SESSION:  PT End of Session - 01/30/24 1350     Visit Number 5    Number of Visits 8    Date for PT Re-Evaluation 02/13/24    Authorization Type HTA    PT Start Time 1350    PT Stop Time 1430    PT Time Calculation (min) 40 min    Activity Tolerance Patient tolerated treatment well    Behavior During Therapy WFL for tasks assessed/performed             Past Medical History:  Diagnosis Date   Borderline hyperlipidemia    Chest pain    Dysrhythmia    Exercise-induced tachycardia    Ganglion    left wrist   GERD (gastroesophageal reflux disease)    Thrombocytopenia (HCC)    borderline   Past Surgical History:  Procedure Laterality Date   COLONOSCOPY N/A 04/22/2018   Procedure: COLONOSCOPY;  Surgeon: Suzette Espy, MD;  Location: [facility];  Service: Endoscopy;  Laterality: N/A;  8:30   GANGLION CYST EXCISION     Left Wrist   KNEE ARTHROSCOPY     Left   TONSILLECTOMY     Patient Active Problem List   Diagnosis Date Noted   RLL pneumonia 03/10/2022   Hypoalbuminemia 03/10/2022   Normocytic anemia 03/10/2022   Atrial flutter (HCC) 09/22/2013   Paroxysmal atrial fibrillation (HCC) 04/23/2013   Rapid palpitations 02/26/2013   FATIGUE 10/24/2009   THROMBOCYTOPENIA 10/11/2009   Chest pain 10/11/2009    PCP: Omie Bickers, MD REFERRING PROVIDER: Omie Bickers, MD  REFERRING DIAG: dizziness and giddiness  THERAPY DIAG:  Dizziness and giddiness  Other abnormalities of gait and mobility  ONSET DATE: 2 months or more  Rationale for Evaluation and Treatment: Rehabilitation  SUBJECTIVE:   SUBJECTIVE STATEMENT: His back is bothering him today; he had to help clean out a storage building of his sons and so was doing a lot of standing and lifting  Eval: After sitting a while he gets  lightheaded; seems to trip himself; did have a fall forward about 2 months ago. He thinks he doesn't seem to have any balance issues.  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 No  PRECAUTIONS: Fall    WEIGHT BEARING RESTRICTIONS: No  FALLS: Has patient fallen in last 6 months? Yes. Number of falls 1   PLOF: Independent  PATIENT GOALS: help with my balance  OBJECTIVE:  Note: Objective measures were completed at Evaluation unless otherwise noted.  DIAGNOSTIC FINDINGS: none  COGNITION: Overall cognitive status: Within functional limits for tasks assessed   SENSATION: WFL    POSTURE:  rounded shoulders, forward head, and flexed trunk  unable to fully stand upright; limited lumbar extension  Cervical ROM:  limited cervical extension and rotation  Active AROM (deg) 01/16/24  Flexion 27  Extension 35  Right lateral flexion 22  Left lateral flexion 12  Right rotation 55  Left rotation 40  (Blank rows = not tested)  STRENGTH: not tested  LOWER EXTREMITY MMT:   MMT Right 01/16/24 Left 01/16/24  Hip flexion 4+ 4+  Hip abduction    Hip adduction    Hip  internal rotation    Hip external rotation    Knee flexion    Knee extension 5 5  Ankle dorsiflexion 5 5  Ankle plantarflexion    Ankle inversion    Ankle eversion    (Blank rows = not tested)  BED MOBILITY:  Not tested  TRANSFERS: Assistive device utilized: None  Sit to stand: Modified independence Stand to sit: Modified independence Chair to chair: Modified independence Floor: not tested  GAIT: Gait pattern: decreased arm swing- Right, decreased arm swing- Left, trunk flexed, poor foot clearance- Right, and poor foot clearance- Left Distance walked: 50 ft in clinic Assistive device utilized: None Level of assistance: Modified independence Comments: difficulty  with turns; direction changes  FUNCTIONAL TESTS:  5 times sit to stand: 16.51 sec Dynamic Gait Index: 12/24 SLS 5" right and 5"  with light UE support DGI 1. Gait level surface (2) Mild Impairment: Walks 20', uses assistive devices, slower speed, mild gait deviations. 2. Change in gait speed (2) Mild Impairment: Is able to change speed but demonstrates mild gait deviations, or not gait deviations but unable to achieve a significant change in velocity, or uses an assistive device. 3. Gait with horizontal head turns (2) Mild Impairment: Performs head turns smoothly with slight change in gait velocity, i.e., minor disruption to smooth gait path or uses walking aid. 4. Gait with vertical head turns 1) Moderate Impairment: Performs head turns with moderate change in gait velocity, slows down, staggers but recovers, can continue to walk. 5. Gait and pivot turn (1) Moderate Impairment: Turns slowly, requires verbal cueing, requires several small steps to catch balance following turn and stop. 6. Step over obstacle (1) Moderate Impairment: Is able to step over box but must stop, then step over. May require verbal cueing. 7. Step around obstacles (1) Moderate Impairment: Is able to clear cones but must significantly slow, speed to accomplish task, or requires verbal cueing. 8. Stairs (2) Mild Impairment: Alternating feet, must use rail.  TOTAL SCORE: 12 / 24   PATIENT SURVEYS:  ABC scale 49.4%  VESTIBULAR ASSESSMENT:  GENERAL OBSERVATION: glasses, forward flexed trunk   SYMPTOM BEHAVIOR:  Subjective history: see above; glasses  Non-Vestibular symptoms: neck pain  Type of dizziness: Imbalance (Disequilibrium) and Lightheadedness/Faint  Frequency: varies  Duration: varies  Aggravating factors: sitting in choir room then standing up to go upstairs  Relieving factors: rest  Progression of symptoms: unchanged  OCULOMOTOR EXAM:  Ocular Alignment: normal  Ocular ROM: No  Limitations  Spontaneous Nystagmus: absent  Gaze-Induced Nystagmus: absent  Smooth Pursuits: intact  Saccades: intact  Convergence/Divergence: 8 inches   VESTIBULAR - OCULAR REFLEX:   Slow VOR: Comment: not tested  VOR Cancellation: Comment: not tested  Head-Impulse Test: not tested  Dynamic Visual Acuity: not tested   POSITIONAL TESTING: Other: not tested  MOTION SENSITIVITY:  Motion Sensitivity Quotient Intensity: 0 = none, 1 = Lightheaded, 2 = Mild, 3 = Moderate, 4 = Severe, 5 = Vomiting  Intensity  1. Sitting to supine   2. Supine to L side   3. Supine to R side   4. Supine to sitting   5. L Hallpike-Dix   6. Up from L    7. R Hallpike-Dix   8. Up from R    9. Sitting, head tipped to L knee 0  10. Head up from L knee 0  11. Sitting, head tipped to R knee 0  12. Head up from R knee 0  13. Sitting head turns  x5 0  14.Sitting head nods x5 0  15. In stance, 180 turn to L    16. In stance, 180 turn to R     OTHOSTATICS: sitting 152/78; standing 142/76  FUNCTIONAL GAIT: see above                                                                                                                             TREATMENT DATE:  01/30/24 Nustep seat 10 x 7' dynamic warm up with moist heat to low back Seated thoracic extension over the back of the chair x 10 Cervical rotation x 10 Scapular retraction 5" hold x 10 Standing: Heel raises on incline 2 x 10 Toe raises on decline 2 x 10 Use of mirror to educate on postural awareness STM to cervical spine x 10' to decrease muscle tightness and pain.    01/28/24 Nustep seat 10 x 5' dynamic warm up Sit to stand no UE assist x 5 Sit to stand to toe raise x 8 no UE assist Heel raises on incline 2 x 10 light UE hold Toe raises on decline 2 x 10 light UE hold Tandem stance 2 x 30" each Seated: Thoracic extension x 10 Scapular retractions 5" hold x 10 STM to bilateral Upper traps x 10' to decrease tightness of soft  tissue    01/22/24 Sitting  Moist heat to cervical spine x 5' to decrease pain and tissue tightness STM x 10 to cervical spine and parapsinals; upper traps to decrease tightness and improve cervical mobility Cervical extension using a towel x 10 Cervical AAROM rotation with towel x 5 each way Upper trap stretch 3 x 10" each Thoracic extension over the chair x 10 Scapular retraction 5" hold x 10    01/16/24 Review of HEP and goals AROM of Cervical spine see above MMT's of LE's see above Seated cervical extension with towel x 10 Scapular retractions 5" hold x 10 Shoulder shrugs x 5 Thoracic extension over the back of the chair x 10 Cervical rotation and nods x  5 each Nose to knee x 5 each Gaze stabilization with 1 target head turns Corner stretch 5 x 10" Standing against the wall x 30"     12/30/23 physical therapy evaluation and HEP instruction   Canalith Repositioning:   Gaze Adaptation:   Habituation:   Other: see below  PATIENT EDUCATION: Education details: Patient educated on exam findings, POC, scope of PT, HEP, and what to expect next visit. Person educated: Patient Education method: Explanation, Demonstration, and Handouts Education comprehension: verbalized understanding, returned demonstration, verbal cues required, and tactile cues required  HOME EXERCISE PROGRAM:' 01/28/24 - Heel Raises with Counter Support  - 1 x daily - 7 x weekly - 2 sets - 10 reps - Standing Ankle Plantar Flexion Dorsiflexion with Counter Support  - 1 x daily - 7 x weekly - 2 sets - 10 reps - tandem stance balance; try not to hold on  -  1 x daily - 7 x weekly - 1 sets - 5 reps - 30 sec hold 01/22/24 Upper trap stretch  Access Code: [email or web address] URL: [email or web address] Date: 01/16/2024 Prepared by: [facility]  Exercises - Cervical Extension AROM with Strap  - 2 x daily - 7 x weekly - 1 sets - 10 reps - Seated Scapular Retraction  - 2 x daily - 7 x weekly - 1 sets -  10 reps - standing single leg balance at the counter (try to not hold on)  - 2 x daily - 7 x weekly - 1 sets - 10 reps - 15 sec hold - Seated Thoracic Lumbar Extension  - 2 x daily - 7 x weekly - 1 sets - 10 reps - Corner Pec Major Stretch  - 2 x daily - 7 x weekly - 1 sets - 5 reps - 10 sec hold - Seated Assisted Cervical Rotation with Towel  - 2 x daily - 7 x weekly - 1 sets - 10 reps - 10 sec hold - Seated Shoulder Shrugs  - 2 x daily - 7 x weekly - 2 sets - 10 reps  Access Code: [email or web address] URL: [email or web address] Date: 12/30/2023 Prepared by: [facility]  Exercises - Cervical Extension AROM with Strap  - 2 x daily - 7 x weekly - 1 sets - 10 reps - Seated Scapular Retraction  - 2 x daily - 7 x weekly - 1 sets - 10 reps - standing single leg balance at the counter (try to not hold on)  - 2 x daily - 7 x weekly - 1 sets - 10 reps - 15 sec hold Pencil push ups GOALS: Goals reviewed with patient? No  SHORT TERM GOALS: Target date: 01/30/2024  patient will be independent with initial HEP  Baseline: Goal status: IN PROGRESS  2.  Patient will report 50% improvement overall  Baseline:  Goal status: IN PROGRESS  3.  Patient will be able to stand SLS x 5" without UE support each leg to demonstrate improved functional balance Baseline: 5" each with light UE support Goal status: IN PROGRESS   LONG TERM GOALS: Target date: 02/13/2024  Patient will be independent in self management strategies to improve quality of life and functional outcomes.  Baseline:  Goal status: IN PROGRESS  2.  Patient will report 75% improvement overall  Baseline:  Goal status: IN PROGRESS  3.  Patient will be able to stand SLS x 10" without UE support each leg to demonstrate improved functional balance Baseline:  Goal status: IN PROGRESS  4.  Patient will improve DGI score by 5 points to demonstrate improve functional balance and gait Baseline: 12/24 Goal status: IN PROGRESS  5.   Patient will remain free of falls Baseline:  Goal status: IN PROGRESS  ASSESSMENT:  CLINICAL IMPRESSION: Today's session started with dynamic warm up on the Nustep followed by focus on balance to start treatment.  Noted patient with curvature in the thoracic spine concave to the right. Patient tends to lean to the right with sitting as he relaxes/fatigues.  Patient needs light UE assist for balance with heel and toe raises; moderate challenge with tandem stance. added balance exercises to HEP.    Patient will benefit from continued skilled therapy services to address deficits and promote return to optimal function.       Eval:Patient is a 78 y.o. male who was seen today for physical therapy evaluation and treatment for dizziness and giddiness. Patient demonstrates decreased  spinal mobility; balance deficits and gait abnormalities which are negatively impacting patient ability to perform ADLs and functional mobility tasks. Patient does not appear to have BPPV.  Patient will benefit from skilled physical therapy services to address these deficits to improve level of function with ADLs, functional mobility tasks, and reduce risk for falls.    OBJECTIVE IMPAIRMENTS: Abnormal gait, decreased activity tolerance, decreased balance, decreased knowledge of condition, decreased mobility, difficulty walking, decreased ROM, and impaired perceived functional ability.   ACTIVITY LIMITATIONS: bending, standing, stairs, transfers, and locomotion level  PARTICIPATION LIMITATIONS: shopping and community activity  REHAB POTENTIAL: Good  CLINICAL DECISION MAKING: Evolving/moderate complexity  EVALUATION COMPLEXITY: Moderate   PLAN:  PT FREQUENCY: 2x/week  PT DURATION: 4 weeks  PLANNED INTERVENTIONS: 97164- PT Re-evaluation, 97110-Therapeutic exercises, 97530- Therapeutic activity, 97112- Neuromuscular re-education, 97535- Self Care, [id number]- Manual therapy, [phone]- Gait training, [phone]- Orthotic  Fit/training, [phone]- Canalith repositioning, [id number]- Aquatic Therapy, [phone]- Splinting, Patient/Family education, Balance training, Stair training, Taping, Dry Needling, Joint mobilization, Joint manipulation, Spinal manipulation, Spinal mobilization, Scar mobilization, and DME instructions.   PLAN FOR NEXT SESSION:   Balance; cervical mobility and postural strengthening.  STM to cervical spine  2:28 PM, 01/30/24 Culver Feighner Small Phillp Dolores MPT [facility] [place] [phone]

## 2024-02-04 ENCOUNTER — Ambulatory Visit (HOSPITAL_COMMUNITY)

## 2024-02-04 DIAGNOSIS — R42 Dizziness and giddiness: Secondary | ICD-10-CM | POA: Diagnosis not present

## 2024-02-04 DIAGNOSIS — R2689 Other abnormalities of gait and mobility: Secondary | ICD-10-CM

## 2024-02-04 NOTE — Therapy (Signed)
 OUTPATIENT PHYSICAL THERAPY VESTIBULAR TREATMENT     Patient Name: Greg Mann MRN: [id number] DOB:17-Jan-1946, 78 y.o., male Today's Date: 02/04/2024  END OF SESSION:  PT End of Session - 02/04/24 1350     Visit Number 6    Number of Visits 8    Date for PT Re-Evaluation 02/13/24    Authorization Type HTA    PT Start Time 1350    PT Stop Time 1430    PT Time Calculation (min) 40 min    Activity Tolerance Patient tolerated treatment well    Behavior During Therapy WFL for tasks assessed/performed             Past Medical History:  Diagnosis Date   Borderline hyperlipidemia    Chest pain    Dysrhythmia    Exercise-induced tachycardia    Ganglion    left wrist   GERD (gastroesophageal reflux disease)    Thrombocytopenia (HCC)    borderline   Past Surgical History:  Procedure Laterality Date   COLONOSCOPY N/A 04/22/2018   Procedure: COLONOSCOPY;  Surgeon: Suzette Espy, MD;  Location: [facility];  Service: Endoscopy;  Laterality: N/A;  8:30   GANGLION CYST EXCISION     Left Wrist   KNEE ARTHROSCOPY     Left   TONSILLECTOMY     Patient Active Problem List   Diagnosis Date Noted   RLL pneumonia 03/10/2022   Hypoalbuminemia 03/10/2022   Normocytic anemia 03/10/2022   Atrial flutter (HCC) 09/22/2013   Paroxysmal atrial fibrillation (HCC) 04/23/2013   Rapid palpitations 02/26/2013   FATIGUE 10/24/2009   THROMBOCYTOPENIA 10/11/2009   Chest pain 10/11/2009    PCP: Omie Bickers, MD REFERRING PROVIDER: Omie Bickers, MD  REFERRING DIAG: dizziness and giddiness  THERAPY DIAG:  Dizziness and giddiness  Other abnormalities of gait and mobility  ONSET DATE: 2 months or more  Rationale for Evaluation and Treatment: Rehabilitation  SUBJECTIVE:   SUBJECTIVE STATEMENT: Feeling a little better today; back is only a little sore  Eval: After sitting a while he gets lightheaded; seems to trip himself; did have a fall forward about 2 months ago. He  thinks he doesn't seem to have any balance issues.  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 No  PRECAUTIONS: Fall    WEIGHT BEARING RESTRICTIONS: No  FALLS: Has patient fallen in last 6 months? Yes. Number of falls 1   PLOF: Independent  PATIENT GOALS: help with my balance  OBJECTIVE:  Note: Objective measures were completed at Evaluation unless otherwise noted.  DIAGNOSTIC FINDINGS: none  COGNITION: Overall cognitive status: Within functional limits for tasks assessed   SENSATION: WFL    POSTURE:  rounded shoulders, forward head, and flexed trunk  unable to fully stand upright; limited lumbar extension  Cervical ROM:  limited cervical extension and rotation  Active AROM (deg) 01/16/24  Flexion 27  Extension 35  Right lateral flexion 22  Left lateral flexion 12  Right rotation 55  Left rotation 40  (Blank rows = not tested)  STRENGTH: not tested  LOWER EXTREMITY MMT:   MMT Right 01/16/24 Left 01/16/24  Hip flexion 4+ 4+  Hip abduction    Hip adduction    Hip internal rotation    Hip external rotation    Knee flexion    Knee  extension 5 5  Ankle dorsiflexion 5 5  Ankle plantarflexion    Ankle inversion    Ankle eversion    (Blank rows = not tested)  BED MOBILITY:  Not tested  TRANSFERS: Assistive device utilized: None  Sit to stand: Modified independence Stand to sit: Modified independence Chair to chair: Modified independence Floor: not tested  GAIT: Gait pattern: decreased arm swing- Right, decreased arm swing- Left, trunk flexed, poor foot clearance- Right, and poor foot clearance- Left Distance walked: 50 ft in clinic Assistive device utilized: None Level of assistance: Modified independence Comments: difficulty with turns; direction changes  FUNCTIONAL TESTS:  5 times sit to stand: 16.51  sec Dynamic Gait Index: 12/24 SLS 5" right and 5"  with light UE support DGI 1. Gait level surface (2) Mild Impairment: Walks 20', uses assistive devices, slower speed, mild gait deviations. 2. Change in gait speed (2) Mild Impairment: Is able to change speed but demonstrates mild gait deviations, or not gait deviations but unable to achieve a significant change in velocity, or uses an assistive device. 3. Gait with horizontal head turns (2) Mild Impairment: Performs head turns smoothly with slight change in gait velocity, i.e., minor disruption to smooth gait path or uses walking aid. 4. Gait with vertical head turns 1) Moderate Impairment: Performs head turns with moderate change in gait velocity, slows down, staggers but recovers, can continue to walk. 5. Gait and pivot turn (1) Moderate Impairment: Turns slowly, requires verbal cueing, requires several small steps to catch balance following turn and stop. 6. Step over obstacle (1) Moderate Impairment: Is able to step over box but must stop, then step over. May require verbal cueing. 7. Step around obstacles (1) Moderate Impairment: Is able to clear cones but must significantly slow, speed to accomplish task, or requires verbal cueing. 8. Stairs (2) Mild Impairment: Alternating feet, must use rail.  TOTAL SCORE: 12 / 24   PATIENT SURVEYS:  ABC scale 49.4%  VESTIBULAR ASSESSMENT:  GENERAL OBSERVATION: glasses, forward flexed trunk   SYMPTOM BEHAVIOR:  Subjective history: see above; glasses  Non-Vestibular symptoms: neck pain  Type of dizziness: Imbalance (Disequilibrium) and Lightheadedness/Faint  Frequency: varies  Duration: varies  Aggravating factors: sitting in choir room then standing up to go upstairs  Relieving factors: rest  Progression of symptoms: unchanged  OCULOMOTOR EXAM:  Ocular Alignment: normal  Ocular ROM: No Limitations  Spontaneous Nystagmus: absent  Gaze-Induced Nystagmus: absent  Smooth Pursuits:  intact  Saccades: intact  Convergence/Divergence: 8 inches   VESTIBULAR - OCULAR REFLEX:   Slow VOR: Comment: not tested  VOR Cancellation: Comment: not tested  Head-Impulse Test: not tested  Dynamic Visual Acuity: not tested   POSITIONAL TESTING: Other: not tested  MOTION SENSITIVITY:  Motion Sensitivity Quotient Intensity: 0 = none, 1 = Lightheaded, 2 = Mild, 3 = Moderate, 4 = Severe, 5 = Vomiting  Intensity  1. Sitting to supine   2. Supine to L side   3. Supine to R side   4. Supine to sitting   5. L Hallpike-Dix   6. Up from L    7. R Hallpike-Dix   8. Up from R    9. Sitting, head tipped to L knee 0  10. Head up from L knee 0  11. Sitting, head tipped to R knee 0  12. Head up from R knee 0  13. Sitting head turns x5 0  14.Sitting head nods x5 0  15. In stance, 180 turn to L  16. In stance, 180 turn to R     OTHOSTATICS: sitting 152/78; standing 142/76  FUNCTIONAL GAIT: see above                                                                                                                             TREATMENT DATE:  02/04/24 Nustep seat 10 x 5' level 2 dynamic warm up Seated thoracic extension over chair x 10 Standing lumbar extension over the bar x 10 Tandem stance x 30" each Heel raises on incline 2 x 10 Toe raises on decline 2 x 10 Slant board 5 x 20" STM to cervical spine x 10' to decrease soft tissue tightness and improve joint extensiblity   01/30/24 Nustep seat 10 x 7' dynamic warm up with moist heat to low back Seated thoracic extension over the back of the chair x 10 Cervical rotation x 10 Scapular retraction 5" hold x 10 Standing: Heel raises on incline 2 x 10 Toe raises on decline 2 x 10 Use of mirror to educate on postural awareness STM to cervical spine x 10' to decrease muscle tightness and pain.    01/28/24 Nustep seat 10 x 5' dynamic warm up Sit to stand no UE assist x 5 Sit to stand to toe raise x 8 no UE assist Heel  raises on incline 2 x 10 light UE hold Toe raises on decline 2 x 10 light UE hold Tandem stance 2 x 30" each Seated: Thoracic extension x 10 Scapular retractions 5" hold x 10 STM to bilateral Upper traps x 10' to decrease tightness of soft tissue    01/22/24 Sitting  Moist heat to cervical spine x 5' to decrease pain and tissue tightness STM x 10 to cervical spine and parapsinals; upper traps to decrease tightness and improve cervical mobility Cervical extension using a towel x 10 Cervical AAROM rotation with towel x 5 each way Upper trap stretch 3 x 10" each Thoracic extension over the chair x 10 Scapular retraction 5" hold x 10    01/16/24 Review of HEP and goals AROM of Cervical spine see above MMT's of LE's see above Seated cervical extension with towel x 10 Scapular retractions 5" hold x 10 Shoulder shrugs x 5 Thoracic extension over the back of the chair x 10 Cervical rotation and nods x  5 each Nose to knee x 5 each Gaze stabilization with 1 target head turns Corner stretch 5 x 10" Standing against the wall x 30"     12/30/23 physical therapy evaluation and HEP instruction   Canalith Repositioning:   Gaze Adaptation:   Habituation:   Other: see below  PATIENT EDUCATION: Education details: Patient educated on exam findings, POC, scope of PT, HEP, and what to expect next visit. Person educated: Patient Education method: Explanation, Demonstration, and Handouts Education comprehension: verbalized understanding, returned demonstration, verbal cues required, and tactile cues required  HOME EXERCISE PROGRAM:' 02/04/24 - Standing Lumbar Extension with Counter  -  1 x daily - 7 x weekly - 1 sets - 10 reps 01/28/24 - Heel Raises with Counter Support  - 1 x daily - 7 x weekly - 2 sets - 10 reps - Standing Ankle Plantar Flexion Dorsiflexion with Counter Support  - 1 x daily - 7 x weekly - 2 sets - 10 reps - tandem stance balance; try not to hold on  - 1 x daily - 7  x weekly - 1 sets - 5 reps - 30 sec hold 01/22/24 Upper trap stretch  Access Code: [email or web address] URL: [email or web address] Date: 01/16/2024 Prepared by: [facility]  Exercises - Cervical Extension AROM with Strap  - 2 x daily - 7 x weekly - 1 sets - 10 reps - Seated Scapular Retraction  - 2 x daily - 7 x weekly - 1 sets - 10 reps - standing single leg balance at the counter (try to not hold on)  - 2 x daily - 7 x weekly - 1 sets - 10 reps - 15 sec hold - Seated Thoracic Lumbar Extension  - 2 x daily - 7 x weekly - 1 sets - 10 reps - Corner Pec Major Stretch  - 2 x daily - 7 x weekly - 1 sets - 5 reps - 10 sec hold - Seated Assisted Cervical Rotation with Towel  - 2 x daily - 7 x weekly - 1 sets - 10 reps - 10 sec hold - Seated Shoulder Shrugs  - 2 x daily - 7 x weekly - 2 sets - 10 reps  Access Code: [email or web address] URL: [email or web address] Date: 12/30/2023 Prepared by: [facility]  Exercises - Cervical Extension AROM with Strap  - 2 x daily - 7 x weekly - 1 sets - 10 reps - Seated Scapular Retraction  - 2 x daily - 7 x weekly - 1 sets - 10 reps - standing single leg balance at the counter (try to not hold on)  - 2 x daily - 7 x weekly - 1 sets - 10 reps - 15 sec hold Pencil push ups GOALS: Goals reviewed with patient? No  SHORT TERM GOALS: Target date: 01/30/2024  patient will be independent with initial HEP  Baseline: Goal status: IN PROGRESS  2.  Patient will report 50% improvement overall  Baseline:  Goal status: IN PROGRESS  3.  Patient will be able to stand SLS x 5" without UE support each leg to demonstrate improved functional balance Baseline: 5" each with light UE support Goal status: IN PROGRESS   LONG TERM GOALS: Target date: 02/13/2024  Patient will be independent in self management strategies to improve quality of life and functional outcomes.  Baseline:  Goal status: IN PROGRESS  2.  Patient will report 75% improvement  overall  Baseline:  Goal status: IN PROGRESS  3.  Patient will be able to stand SLS x 10" without UE support each leg to demonstrate improved functional balance Baseline:  Goal status: IN PROGRESS  4.  Patient will improve DGI score by 5 points to demonstrate improve functional balance and gait Baseline: 12/24 Goal status: IN PROGRESS  5.  Patient will remain free of falls Baseline:  Goal status: IN PROGRESS  ASSESSMENT:  CLINICAL IMPRESSION: Today's session started with dynamic warm up on the Nustep followed by focus on balance to start treatment.  Updated HEP added lumbar extension due to patient tendency to forward flex.   Continued with focus on balance and improving posture with functional activities.  Patient needs continual cues to improve posture and pick up his feet with walking.    Patient will benefit from continued skilled therapy services to address deficits and promote return to optimal function.       Eval:Patient is a 78 y.o. male who was seen today for physical therapy evaluation and treatment for dizziness and giddiness. Patient demonstrates decreased spinal mobility; balance deficits and gait abnormalities which are negatively impacting patient ability to perform ADLs and functional mobility tasks. Patient does not appear to have BPPV.  Patient will benefit from skilled physical therapy services to address these deficits to improve level of function with ADLs, functional mobility tasks, and reduce risk for falls.    OBJECTIVE IMPAIRMENTS: Abnormal gait, decreased activity tolerance, decreased balance, decreased knowledge of condition, decreased mobility, difficulty walking, decreased ROM, and impaired perceived functional ability.   ACTIVITY LIMITATIONS: bending, standing, stairs, transfers, and locomotion level  PARTICIPATION LIMITATIONS: shopping and community activity  REHAB POTENTIAL: Good  CLINICAL DECISION MAKING: Evolving/moderate complexity  EVALUATION  COMPLEXITY: Moderate   PLAN:  PT FREQUENCY: 2x/week  PT DURATION: 4 weeks  PLANNED INTERVENTIONS: 97164- PT Re-evaluation, 97110-Therapeutic exercises, 97530- Therapeutic activity, 97112- Neuromuscular re-education, 97535- Self Care, 29528- Manual therapy, [phone]- Gait training, [phone]- Orthotic Fit/training, [phone]- Canalith repositioning, [id number]- Aquatic Therapy, [phone]- Splinting, Patient/Family education, Balance training, Stair training, Taping, Dry Needling, Joint mobilization, Joint manipulation, Spinal manipulation, Spinal mobilization, Scar mobilization, and DME instructions.   PLAN FOR NEXT SESSION:   Balance; cervical mobility and postural strengthening.  STM to cervical spine  2:30 PM, 02/04/24 Vivan Vanderveer Small Alin Chavira MPT [facility] [place] [phone]

## 2024-02-06 ENCOUNTER — Ambulatory Visit (HOSPITAL_COMMUNITY)

## 2024-02-06 DIAGNOSIS — R42 Dizziness and giddiness: Secondary | ICD-10-CM | POA: Diagnosis not present

## 2024-02-06 DIAGNOSIS — R2689 Other abnormalities of gait and mobility: Secondary | ICD-10-CM

## 2024-02-06 NOTE — Therapy (Signed)
 OUTPATIENT PHYSICAL THERAPY VESTIBULAR TREATMENT     Patient Name: Greg Mann MRN: [id number] DOB:08-Dec-1945, 78 y.o., male Today's Date: 02/06/2024  END OF SESSION:  PT End of Session - 02/06/24 1359     Visit Number 7    Number of Visits 8    Date for PT Re-Evaluation 02/13/24    Authorization Type HTA    PT Start Time 0159   late check   PT Stop Time 0230    PT Time Calculation (min) 31 min    Activity Tolerance Patient tolerated treatment well    Behavior During Therapy [facility] for tasks assessed/performed             Past Medical History:  Diagnosis Date   Borderline hyperlipidemia    Chest pain    Dysrhythmia    Exercise-induced tachycardia    Ganglion    left wrist   GERD (gastroesophageal reflux disease)    Thrombocytopenia (HCC)    borderline   Past Surgical History:  Procedure Laterality Date   COLONOSCOPY N/A 04/22/2018   Procedure: COLONOSCOPY;  Surgeon: Suzette Espy, MD;  Location: [facility];  Service: Endoscopy;  Laterality: N/A;  8:30   GANGLION CYST EXCISION     Left Wrist   KNEE ARTHROSCOPY     Left   TONSILLECTOMY     Patient Active Problem List   Diagnosis Date Noted   RLL pneumonia 03/10/2022   Hypoalbuminemia 03/10/2022   Normocytic anemia 03/10/2022   Atrial flutter (HCC) 09/22/2013   Paroxysmal atrial fibrillation (HCC) 04/23/2013   Rapid palpitations 02/26/2013   FATIGUE 10/24/2009   THROMBOCYTOPENIA 10/11/2009   Chest pain 10/11/2009    PCP: Omie Bickers, MD REFERRING PROVIDER: Omie Bickers, MD  REFERRING DIAG: dizziness and giddiness  THERAPY DIAG:  Dizziness and giddiness  Other abnormalities of gait and mobility  ONSET DATE: 2 months or more  Rationale for Evaluation and Treatment: Rehabilitation  SUBJECTIVE:   SUBJECTIVE STATEMENT: No pain today but he did have a fall out in the yard yesterday while trimming bushes but got up ok; does have a bruise on his right thigh  Eval: After sitting a while he  gets lightheaded; seems to trip himself; did have a fall forward about 2 months ago. He thinks he doesn't seem to have any balance issues.  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 No  PRECAUTIONS: Fall    WEIGHT BEARING RESTRICTIONS: No  FALLS: Has patient fallen in last 6 months? Yes. Number of falls 1   PLOF: Independent  PATIENT GOALS: help with my balance  OBJECTIVE:  Note: Objective measures were completed at Evaluation unless otherwise noted.  DIAGNOSTIC FINDINGS: none  COGNITION: Overall cognitive status: Within functional limits for tasks assessed   SENSATION: WFL    POSTURE:  rounded shoulders, forward head, and flexed trunk  unable to fully stand upright; limited lumbar extension  Cervical ROM:  limited cervical extension and rotation  Active AROM (deg) 01/16/24  Flexion 27  Extension 35  Right lateral flexion 22  Left lateral flexion 12  Right rotation 55  Left rotation 40  (Blank rows = not tested)  STRENGTH: not tested  LOWER EXTREMITY MMT:   MMT Right 01/16/24 Left 01/16/24  Hip flexion 4+ 4+  Hip abduction    Hip adduction  Hip internal rotation    Hip external rotation    Knee flexion    Knee extension 5 5  Ankle dorsiflexion 5 5  Ankle plantarflexion    Ankle inversion    Ankle eversion    (Blank rows = not tested)  BED MOBILITY:  Not tested  TRANSFERS: Assistive device utilized: None  Sit to stand: Modified independence Stand to sit: Modified independence Chair to chair: Modified independence Floor: not tested  GAIT: Gait pattern: decreased arm swing- Right, decreased arm swing- Left, trunk flexed, poor foot clearance- Right, and poor foot clearance- Left Distance walked: 50 ft in clinic Assistive device utilized: None Level of assistance: Modified independence Comments:  difficulty with turns; direction changes  FUNCTIONAL TESTS:  5 times sit to stand: 16.51 sec Dynamic Gait Index: 12/24 SLS 5" right and 5"  with light UE support DGI 1. Gait level surface (2) Mild Impairment: Walks 20', uses assistive devices, slower speed, mild gait deviations. 2. Change in gait speed (2) Mild Impairment: Is able to change speed but demonstrates mild gait deviations, or not gait deviations but unable to achieve a significant change in velocity, or uses an assistive device. 3. Gait with horizontal head turns (2) Mild Impairment: Performs head turns smoothly with slight change in gait velocity, i.e., minor disruption to smooth gait path or uses walking aid. 4. Gait with vertical head turns 1) Moderate Impairment: Performs head turns with moderate change in gait velocity, slows down, staggers but recovers, can continue to walk. 5. Gait and pivot turn (1) Moderate Impairment: Turns slowly, requires verbal cueing, requires several small steps to catch balance following turn and stop. 6. Step over obstacle (1) Moderate Impairment: Is able to step over box but must stop, then step over. May require verbal cueing. 7. Step around obstacles (1) Moderate Impairment: Is able to clear cones but must significantly slow, speed to accomplish task, or requires verbal cueing. 8. Stairs (2) Mild Impairment: Alternating feet, must use rail.  TOTAL SCORE: 12 / 24   PATIENT SURVEYS:  ABC scale 49.4%  VESTIBULAR ASSESSMENT:  GENERAL OBSERVATION: glasses, forward flexed trunk   SYMPTOM BEHAVIOR:  Subjective history: see above; glasses  Non-Vestibular symptoms: neck pain  Type of dizziness: Imbalance (Disequilibrium) and Lightheadedness/Faint  Frequency: varies  Duration: varies  Aggravating factors: sitting in choir room then standing up to go upstairs  Relieving factors: rest  Progression of symptoms: unchanged  OCULOMOTOR EXAM:  Ocular Alignment: normal  Ocular ROM: No  Limitations  Spontaneous Nystagmus: absent  Gaze-Induced Nystagmus: absent  Smooth Pursuits: intact  Saccades: intact  Convergence/Divergence: 8 inches   VESTIBULAR - OCULAR REFLEX:   Slow VOR: Comment: not tested  VOR Cancellation: Comment: not tested  Head-Impulse Test: not tested  Dynamic Visual Acuity: not tested   POSITIONAL TESTING: Other: not tested  MOTION SENSITIVITY:  Motion Sensitivity Quotient Intensity: 0 = none, 1 = Lightheaded, 2 = Mild, 3 = Moderate, 4 = Severe, 5 = Vomiting  Intensity  1. Sitting to supine   2. Supine to L side   3. Supine to R side   4. Supine to sitting   5. L Hallpike-Dix   6. Up from L    7. R Hallpike-Dix   8. Up from R    9. Sitting, head tipped to L knee 0  10. Head up from L knee 0  11. Sitting, head tipped to R knee 0  12. Head up from R knee 0  13. Sitting head  turns x5 0  14.Sitting head nods x5 0  15. In stance, 180 turn to L    16. In stance, 180 turn to R     OTHOSTATICS: sitting 152/78; standing 142/76  FUNCTIONAL GAIT: see above                                                                                                                             TREATMENT DATE:  02/06/24 Seated thoracic extension 2 x 10 Standing lumbar extension 2 x 10 Slant board 5 x 20" Tandem stance 2 x 30" Foam beam stand with head turns and nods x 10 each light UE assist  Hip extension 2 x 10 each Hip abduction 2 x 10 each Nustep seat 10 level 3 x 5' to finish up treatment   02/04/24 Nustep seat 10 x 5' level 2 dynamic warm up Seated thoracic extension over chair x 10 Standing lumbar extension over the bar x 10 Tandem stance x 30" each Heel raises on incline 2 x 10 Toe raises on decline 2 x 10 Slant board 5 x 20" STM to cervical spine x 10' to decrease soft tissue tightness and improve joint extensiblity   01/30/24 Nustep seat 10 x 7' dynamic warm up with moist heat to low back Seated thoracic extension over the back of the  chair x 10 Cervical rotation x 10 Scapular retraction 5" hold x 10 Standing: Heel raises on incline 2 x 10 Toe raises on decline 2 x 10 Use of mirror to educate on postural awareness STM to cervical spine x 10' to decrease muscle tightness and pain.    01/28/24 Nustep seat 10 x 5' dynamic warm up Sit to stand no UE assist x 5 Sit to stand to toe raise x 8 no UE assist Heel raises on incline 2 x 10 light UE hold Toe raises on decline 2 x 10 light UE hold Tandem stance 2 x 30" each Seated: Thoracic extension x 10 Scapular retractions 5" hold x 10 STM to bilateral Upper traps x 10' to decrease tightness of soft tissue    01/22/24 Sitting  Moist heat to cervical spine x 5' to decrease pain and tissue tightness STM x 10 to cervical spine and parapsinals; upper traps to decrease tightness and improve cervical mobility Cervical extension using a towel x 10 Cervical AAROM rotation with towel x 5 each way Upper trap stretch 3 x 10" each Thoracic extension over the chair x 10 Scapular retraction 5" hold x 10    01/16/24 Review of HEP and goals AROM of Cervical spine see above MMT's of LE's see above Seated cervical extension with towel x 10 Scapular retractions 5" hold x 10 Shoulder shrugs x 5 Thoracic extension over the back of the chair x 10 Cervical rotation and nods x  5 each Nose to knee x 5 each Gaze stabilization with 1 target head turns Corner stretch 5 x 10" Standing against the wall  x 30"     12/30/23 physical therapy evaluation and HEP instruction   Canalith Repositioning:   Gaze Adaptation:   Habituation:   Other: see below  PATIENT EDUCATION: Education details: Patient educated on exam findings, POC, scope of PT, HEP, and what to expect next visit. Person educated: Patient Education method: Explanation, Demonstration, and Handouts Education comprehension: verbalized understanding, returned demonstration, verbal cues required, and tactile cues  required  HOME EXERCISE PROGRAM:' 02/04/24 - Standing Lumbar Extension with Counter  - 1 x daily - 7 x weekly - 1 sets - 10 reps 01/28/24 - Heel Raises with Counter Support  - 1 x daily - 7 x weekly - 2 sets - 10 reps - Standing Ankle Plantar Flexion Dorsiflexion with Counter Support  - 1 x daily - 7 x weekly - 2 sets - 10 reps - tandem stance balance; try not to hold on  - 1 x daily - 7 x weekly - 1 sets - 5 reps - 30 sec hold 01/22/24 Upper trap stretch  Access Code: [email or web address] URL: [email or web address] Date: 01/16/2024 Prepared by: [facility]  Exercises - Cervical Extension AROM with Strap  - 2 x daily - 7 x weekly - 1 sets - 10 reps - Seated Scapular Retraction  - 2 x daily - 7 x weekly - 1 sets - 10 reps - standing single leg balance at the counter (try to not hold on)  - 2 x daily - 7 x weekly - 1 sets - 10 reps - 15 sec hold - Seated Thoracic Lumbar Extension  - 2 x daily - 7 x weekly - 1 sets - 10 reps - Corner Pec Major Stretch  - 2 x daily - 7 x weekly - 1 sets - 5 reps - 10 sec hold - Seated Assisted Cervical Rotation with Towel  - 2 x daily - 7 x weekly - 1 sets - 10 reps - 10 sec hold - Seated Shoulder Shrugs  - 2 x daily - 7 x weekly - 2 sets - 10 reps  Access Code: [email or web address] URL: [email or web address] Date: 12/30/2023 Prepared by: [facility]  Exercises - Cervical Extension AROM with Strap  - 2 x daily - 7 x weekly - 1 sets - 10 reps - Seated Scapular Retraction  - 2 x daily - 7 x weekly - 1 sets - 10 reps - standing single leg balance at the counter (try to not hold on)  - 2 x daily - 7 x weekly - 1 sets - 10 reps - 15 sec hold Pencil push ups GOALS: Goals reviewed with patient? No  SHORT TERM GOALS: Target date: 01/30/2024  patient will be independent with initial HEP  Baseline: Goal status: IN PROGRESS  2.  Patient will report 50% improvement overall  Baseline:  Goal status: IN PROGRESS  3.  Patient will be able to stand SLS x  5" without UE support each leg to demonstrate improved functional balance Baseline: 5" each with light UE support Goal status: IN PROGRESS   LONG TERM GOALS: Target date: 02/13/2024  Patient will be independent in self management strategies to improve quality of life and functional outcomes.  Baseline:  Goal status: IN PROGRESS  2.  Patient will report 75% improvement overall  Baseline:  Goal status: IN PROGRESS  3.  Patient will be able to stand SLS x 10" without UE support each leg to demonstrate improved functional balance Baseline:  Goal status: IN PROGRESS  4.  Patient  will improve DGI score by 5 points to demonstrate improve functional balance and gait Baseline: 12/24 Goal status: IN PROGRESS  5.  Patient will remain free of falls Baseline:  Goal status: IN PROGRESS  ASSESSMENT:  CLINICAL IMPRESSION: Late arrival today.  Continued to work on postural awareness, general strength and balance.  Patient unable to let go of the parallel bars with standing balance exercise on foam beam.  He continues to forward flex at the trunk so continued with spinal extension exercise and postural strengthening.  Of note he tends to externally rotate his hips with hip extension and abduction but improves with cues.    Patient will benefit from continued skilled therapy services to address deficits and promote return to optimal function.       Eval:Patient is a 78 y.o. male who was seen today for physical therapy evaluation and treatment for dizziness and giddiness. Patient demonstrates decreased spinal mobility; balance deficits and gait abnormalities which are negatively impacting patient ability to perform ADLs and functional mobility tasks. Patient does not appear to have BPPV.  Patient will benefit from skilled physical therapy services to address these deficits to improve level of function with ADLs, functional mobility tasks, and reduce risk for falls.    OBJECTIVE IMPAIRMENTS: Abnormal  gait, decreased activity tolerance, decreased balance, decreased knowledge of condition, decreased mobility, difficulty walking, decreased ROM, and impaired perceived functional ability.   ACTIVITY LIMITATIONS: bending, standing, stairs, transfers, and locomotion level  PARTICIPATION LIMITATIONS: shopping and community activity  REHAB POTENTIAL: Good  CLINICAL DECISION MAKING: Evolving/moderate complexity  EVALUATION COMPLEXITY: Moderate   PLAN:  PT FREQUENCY: 2x/week  PT DURATION: 4 weeks  PLANNED INTERVENTIONS: 97164- PT Re-evaluation, 97110-Therapeutic exercises, 97530- Therapeutic activity, 97112- Neuromuscular re-education, 97535- Self Care, 19147- Manual therapy, [phone]- Gait training, [phone]- Orthotic Fit/training, [phone]- Canalith repositioning, [id number]- Aquatic Therapy, [phone]- Splinting, Patient/Family education, Balance training, Stair training, Taping, Dry Needling, Joint mobilization, Joint manipulation, Spinal manipulation, Spinal mobilization, Scar mobilization, and DME instructions.   PLAN FOR NEXT SESSION:   Balance; cervical mobility and postural strengthening.  STM to cervical spine as appropriate; reassess next visit  2:26 PM, 02/06/24 Crystalynn Mcinerney Small Ana Liaw MPT [place] [facility] [place] [phone]

## 2024-02-09 ENCOUNTER — Ambulatory Visit (HOSPITAL_COMMUNITY): Attending: Internal Medicine

## 2024-02-09 DIAGNOSIS — R2689 Other abnormalities of gait and mobility: Secondary | ICD-10-CM | POA: Diagnosis not present

## 2024-02-09 DIAGNOSIS — R42 Dizziness and giddiness: Secondary | ICD-10-CM | POA: Diagnosis not present

## 2024-02-09 NOTE — Therapy (Signed)
 OUTPATIENT PHYSICAL THERAPY VESTIBULAR PROGRESS NOTE Progress Note Reporting Period 12/30/23 to 02/09/24  See note below for Objective Data and Assessment of Progress/Goals.         Patient Name: Greg Mann MRN: [id number] DOB:[birth year], 78 y.o., male Today's Date: 02/09/2024  END OF SESSION:  PT End of Session - 02/09/24 1056     Visit Number 8    Number of Visits 8    Date for PT Re-Evaluation 02/13/24    Authorization Type HTA    PT Start Time 1056    PT Stop Time 1138    PT Time Calculation (min) 42 min    Activity Tolerance Patient tolerated treatment well    Behavior During Therapy WFL for tasks assessed/performed             Past Medical History:  Diagnosis Date   Borderline hyperlipidemia    Chest pain    Dysrhythmia    Exercise-induced tachycardia    Ganglion    left wrist   GERD (gastroesophageal reflux disease)    Thrombocytopenia (HCC)    borderline   Past Surgical History:  Procedure Laterality Date   COLONOSCOPY N/A 04/22/2018   Procedure: COLONOSCOPY;  Surgeon: Suzette Espy, MD;  Location: [facility];  Service: Endoscopy;  Laterality: N/A;  8:30   GANGLION CYST EXCISION     Left Wrist   KNEE ARTHROSCOPY     Left   TONSILLECTOMY     Patient Active Problem List   Diagnosis Date Noted   RLL pneumonia 03/10/2022   Hypoalbuminemia 03/10/2022   Normocytic anemia 03/10/2022   Atrial flutter (HCC) 09/22/2013   Paroxysmal atrial fibrillation (HCC) 04/23/2013   Rapid palpitations 02/26/2013   FATIGUE 10/24/2009   THROMBOCYTOPENIA 10/11/2009   Chest pain 10/11/2009    PCP: Omie Bickers, MD REFERRING PROVIDER: Omie Bickers, MD  REFERRING DIAG: dizziness and giddiness  THERAPY DIAG:  Dizziness and giddiness - Plan: PT plan of care cert/re-cert  Other abnormalities of gait and mobility - Plan: PT plan of care cert/re-cert  ONSET DATE: 2 months or more  Rationale for Evaluation and Treatment: Rehabilitation  SUBJECTIVE:    SUBJECTIVE STATEMENT: About "25% better" overall; no new falls since last visit; has to be very careful with quick turns.  Eval: After sitting a while he gets lightheaded; seems to trip himself; did have a fall forward about 2 months ago. He thinks he doesn't seem to have any balance issues.  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 No  PRECAUTIONS: Fall    WEIGHT BEARING RESTRICTIONS: No  FALLS: Has patient fallen in last 6 months? Yes. Number of falls 1   PLOF: Independent  PATIENT GOALS: help with my balance  OBJECTIVE:  Note: Objective measures were completed at Evaluation unless otherwise noted.  DIAGNOSTIC FINDINGS: none  COGNITION: Overall cognitive status: Within functional limits for tasks assessed   SENSATION: WFL    POSTURE:  rounded shoulders, forward head, and flexed trunk  unable to fully stand upright; limited lumbar extension  Cervical ROM:  limited cervical extension and rotation  Active AROM (deg) 01/16/24 AROM 02/09/24  Flexion 27 44  Extension 35 38  Right lateral flexion 22 28  Left lateral flexion 12 21  Right rotation 55 55  Left rotation 40 50  (Blank rows =  not tested)  STRENGTH: not tested  LOWER EXTREMITY MMT:   MMT Right 01/16/24 Left 01/16/24  Hip flexion 4+ 4+  Hip abduction    Hip adduction    Hip internal rotation    Hip external rotation    Knee flexion    Knee extension 5 5  Ankle dorsiflexion 5 5  Ankle plantarflexion    Ankle inversion    Ankle eversion    (Blank rows = not tested)  BED MOBILITY:  Not tested  TRANSFERS: Assistive device utilized: None  Sit to stand: Modified independence Stand to sit: Modified independence Chair to chair: Modified independence Floor: not tested  GAIT: Gait pattern: decreased arm swing- Right, decreased arm swing- Left, trunk  flexed, poor foot clearance- Right, and poor foot clearance- Left Distance walked: 50 ft in clinic Assistive device utilized: None Level of assistance: Modified independence Comments: difficulty with turns; direction changes  FUNCTIONAL TESTS:  5 times sit to stand: 16.51 sec Dynamic Gait Index: 12/24 SLS 5" right and 5"  with light UE support DGI 1. Gait level surface (2) Mild Impairment: Walks 20', uses assistive devices, slower speed, mild gait deviations. 2. Change in gait speed (2) Mild Impairment: Is able to change speed but demonstrates mild gait deviations, or not gait deviations but unable to achieve a significant change in velocity, or uses an assistive device. 3. Gait with horizontal head turns (2) Mild Impairment: Performs head turns smoothly with slight change in gait velocity, i.e., minor disruption to smooth gait path or uses walking aid. 4. Gait with vertical head turns 1) Moderate Impairment: Performs head turns with moderate change in gait velocity, slows down, staggers but recovers, can continue to walk. 5. Gait and pivot turn (1) Moderate Impairment: Turns slowly, requires verbal cueing, requires several small steps to catch balance following turn and stop. 6. Step over obstacle (1) Moderate Impairment: Is able to step over box but must stop, then step over. May require verbal cueing. 7. Step around obstacles (1) Moderate Impairment: Is able to clear cones but must significantly slow, speed to accomplish task, or requires verbal cueing. 8. Stairs (2) Mild Impairment: Alternating feet, must use rail.  TOTAL SCORE: 12 / 24   PATIENT SURVEYS:  ABC scale 49.4%  VESTIBULAR ASSESSMENT:  GENERAL OBSERVATION: glasses, forward flexed trunk   SYMPTOM BEHAVIOR:  Subjective history: see above; glasses  Non-Vestibular symptoms: neck pain  Type of dizziness: Imbalance (Disequilibrium) and Lightheadedness/Faint  Frequency: varies  Duration: varies  Aggravating  factors: sitting in choir room then standing up to go upstairs  Relieving factors: rest  Progression of symptoms: unchanged  OCULOMOTOR EXAM:  Ocular Alignment: normal  Ocular ROM: No Limitations  Spontaneous Nystagmus: absent  Gaze-Induced Nystagmus: absent  Smooth Pursuits: intact  Saccades: intact  Convergence/Divergence: 8 inches   VESTIBULAR - OCULAR REFLEX:   Slow VOR: Comment: not tested  VOR Cancellation: Comment: not tested  Head-Impulse Test: not tested  Dynamic Visual Acuity: not tested   POSITIONAL TESTING: Other: not tested  MOTION SENSITIVITY:  Motion Sensitivity Quotient Intensity: 0 = none, 1 = Lightheaded, 2 = Mild, 3 = Moderate, 4 = Severe, 5 = Vomiting  Intensity  1. Sitting to supine   2. Supine to L side   3. Supine to R side   4. Supine to sitting   5. L Hallpike-Dix   6. Up from L    7. R Hallpike-Dix   8. Up from R    9. Sitting, head tipped  to L knee 0  10. Head up from L knee 0  11. Sitting, head tipped to R knee 0  12. Head up from R knee 0  13. Sitting head turns x5 0  14.Sitting head nods x5 0  15. In stance, 180 turn to L    16. In stance, 180 turn to R     OTHOSTATICS: sitting 152/78; standing 142/76  FUNCTIONAL GAIT: see above                                                                                                                             TREATMENT DATE:  02/09/24 Progress note ABC scale 81.3% while sitting with moist heat AROM of cervical spine see above SLS left 8" and right 9" DGI 1. Gait level surface (2) Mild Impairment: Walks 20', uses assistive devices, slower speed, mild gait deviations. 2. Change in gait speed (2) Mild Impairment: Is able to change speed but demonstrates mild gait deviations, or not gait deviations but unable to achieve a significant change in velocity, or uses an assistive device. 3. Gait with horizontal head turns (2) Mild Impairment: Performs head turns smoothly with slight change in  gait velocity, i.e., minor disruption to smooth gait path or uses walking aid. 4. Gait with vertical head turns (2) Mild Impairment: Performs head turns smoothly with slight change in gait velocity, i.e., minor disruption to smooth gait path or uses walking aid. 5. Gait and pivot turn (2) Mild Impairment: Pivot turns safely in > 3 seconds and stops with no loss of balance. 6. Step over obstacle (2) Mild Impairment: Is able to step over box, but must slow down and adjust steps to clear box safely. 7. Step around obstacles (2) Mild Impairment: Is able to step around both cones, but must slow down and adjust steps to clear cones. 8. Stairs (2) Mild Impairment: Alternating feet, must use rail.  TOTAL SCORE: 16 / 24 Ended with Nustep level 3 x 5'  02/06/24 Seated thoracic extension 2 x 10 Standing lumbar extension 2 x 10 Slant board 5 x 20" Tandem stance 2 x 30" Foam beam stand with head turns and nods x 10 each light UE assist  Hip extension 2 x 10 each Hip abduction 2 x 10 each Nustep seat 10 level 3 x 5' to finish up treatment    02/04/24 Nustep seat 10 x 5' level 2 dynamic warm up Seated thoracic extension over chair x 10 Standing lumbar extension over the bar x 10 Tandem stance x 30" each Heel raises on incline 2 x 10 Toe raises on decline 2 x 10 Slant board 5 x 20" STM to cervical spine x 10' to decrease soft tissue tightness and improve joint extensiblity   01/30/24 Nustep seat 10 x 7' dynamic warm up with moist heat to low back Seated thoracic extension over the back of the chair x 10 Cervical rotation x 10 Scapular retraction 5" hold x 10  Standing: Heel raises on incline 2 x 10 Toe raises on decline 2 x 10 Use of mirror to educate on postural awareness STM to cervical spine x 10' to decrease muscle tightness and pain.    01/28/24 Nustep seat 10 x 5' dynamic warm up Sit to stand no UE assist x 5 Sit to stand to toe raise x 8 no UE assist Heel raises on incline 2  x 10 light UE hold Toe raises on decline 2 x 10 light UE hold Tandem stance 2 x 30" each Seated: Thoracic extension x 10 Scapular retractions 5" hold x 10 STM to bilateral Upper traps x 10' to decrease tightness of soft tissue    01/22/24 Sitting  Moist heat to cervical spine x 5' to decrease pain and tissue tightness STM x 10 to cervical spine and parapsinals; upper traps to decrease tightness and improve cervical mobility Cervical extension using a towel x 10 Cervical AAROM rotation with towel x 5 each way Upper trap stretch 3 x 10" each Thoracic extension over the chair x 10 Scapular retraction 5" hold x 10    01/16/24 Review of HEP and goals AROM of Cervical spine see above MMT's of LE's see above Seated cervical extension with towel x 10 Scapular retractions 5" hold x 10 Shoulder shrugs x 5 Thoracic extension over the back of the chair x 10 Cervical rotation and nods x  5 each Nose to knee x 5 each Gaze stabilization with 1 target head turns Corner stretch 5 x 10" Standing against the wall x 30"     12/30/23 physical therapy evaluation and HEP instruction   Canalith Repositioning:   Gaze Adaptation:   Habituation:   Other: see below  PATIENT EDUCATION: Education details: Patient educated on exam findings, POC, scope of PT, HEP, and what to expect next visit. Person educated: Patient Education method: Explanation, Demonstration, and Handouts Education comprehension: verbalized understanding, returned demonstration, verbal cues required, and tactile cues required  HOME EXERCISE PROGRAM:' 02/04/24 - Standing Lumbar Extension with Counter  - 1 x daily - 7 x weekly - 1 sets - 10 reps 01/28/24 - Heel Raises with Counter Support  - 1 x daily - 7 x weekly - 2 sets - 10 reps - Standing Ankle Plantar Flexion Dorsiflexion with Counter Support  - 1 x daily - 7 x weekly - 2 sets - 10 reps - tandem stance balance; try not to hold on  - 1 x daily - 7 x weekly - 1 sets -  5 reps - 30 sec hold 01/22/24 Upper trap stretch  Access Code: [email or web address] URL: [email or web address] Date: 01/16/2024 Prepared by: [facility]  Exercises - Cervical Extension AROM with Strap  - 2 x daily - 7 x weekly - 1 sets - 10 reps - Seated Scapular Retraction  - 2 x daily - 7 x weekly - 1 sets - 10 reps - standing single leg balance at the counter (try to not hold on)  - 2 x daily - 7 x weekly - 1 sets - 10 reps - 15 sec hold - Seated Thoracic Lumbar Extension  - 2 x daily - 7 x weekly - 1 sets - 10 reps - Corner Pec Major Stretch  - 2 x daily - 7 x weekly - 1 sets - 5 reps - 10 sec hold - Seated Assisted Cervical Rotation with Towel  - 2 x daily - 7 x weekly - 1 sets - 10 reps - 10 sec hold -  Seated Shoulder Shrugs  - 2 x daily - 7 x weekly - 2 sets - 10 reps  Access Code: [email or web address] URL: [email or web address] Date: 12/30/2023 Prepared by: [facility]  Exercises - Cervical Extension AROM with Strap  - 2 x daily - 7 x weekly - 1 sets - 10 reps - Seated Scapular Retraction  - 2 x daily - 7 x weekly - 1 sets - 10 reps - standing single leg balance at the counter (try to not hold on)  - 2 x daily - 7 x weekly - 1 sets - 10 reps - 15 sec hold Pencil push ups GOALS: Goals reviewed with patient? No  SHORT TERM GOALS: Target date: 01/30/2024  patient will be independent with initial HEP  Baseline: Goal status: MET  2.  Patient will report 50% improvement overall  Baseline: 25% better Goal status: IN PROGRESS  3.  Patient will be able to stand SLS x 5" without UE support each leg to demonstrate improved functional balance Baseline: 5" each with light UE support Goal status: MET   LONG TERM GOALS: Target date: 02/13/2024  Patient will be independent in self management strategies to improve quality of life and functional outcomes.  Baseline:  Goal status: IN PROGRESS  2.  Patient will report 75% improvement overall  Baseline:  Goal status: IN  PROGRESS  3.  Patient will be able to stand SLS x 10" without UE support each leg to demonstrate improved functional balance Baseline: 8" on left and 9" on right 02/09/24 Goal status: IN PROGRESS  4.  Patient will improve DGI score by 5 points to demonstrate improve functional balance and gait Baseline: 12/24; 16/24 02/09/24 Goal status: IN PROGRESS  5.  Patient will remain free of falls Baseline: fell in the yard last week trimming bushes Goal status: IN PROGRESS  ASSESSMENT:  CLINICAL IMPRESSION: Progress note today as it is his 8th visit.  Patient has made good improvement with DGI and ABC scale.  He has improved cervical mobility as well.  He achieved 2/8 set rehab goals and he would like to continue with therapy to address remaining unmet and partially met goals.   Patient will benefit from continued skilled therapy services to address deficits and promote return to optimal function.       Eval:Patient is a 78 y.o. male who was seen today for physical therapy evaluation and treatment for dizziness and giddiness. Patient demonstrates decreased spinal mobility; balance deficits and gait abnormalities which are negatively impacting patient ability to perform ADLs and functional mobility tasks. Patient does not appear to have BPPV.  Patient will benefit from skilled physical therapy services to address these deficits to improve level of function with ADLs, functional mobility tasks, and reduce risk for falls.    OBJECTIVE IMPAIRMENTS: Abnormal gait, decreased activity tolerance, decreased balance, decreased knowledge of condition, decreased mobility, difficulty walking, decreased ROM, and impaired perceived functional ability.   ACTIVITY LIMITATIONS: bending, standing, stairs, transfers, and locomotion level  PARTICIPATION LIMITATIONS: shopping and community activity  REHAB POTENTIAL: Good  CLINICAL DECISION MAKING: Evolving/moderate complexity  EVALUATION COMPLEXITY:  Moderate   PLAN:  PT FREQUENCY: 2x/week  PT DURATION: 4 weeks  PLANNED INTERVENTIONS: 97164- PT Re-evaluation, 97110-Therapeutic exercises, 97530- Therapeutic activity, 97112- Neuromuscular re-education, 97535- Self Care, 47829- Manual therapy, [phone]- Gait training, [phone]- Orthotic Fit/training, [phone]- Canalith repositioning, [id number]- Aquatic Therapy, [phone]- Splinting, Patient/Family education, Balance training, Stair training, Taping, Dry Needling, Joint mobilization, Joint manipulation, Spinal manipulation, Spinal mobilization, Scar mobilization, and  DME instructions.   PLAN FOR NEXT SESSION:   Balance; cervical mobility and postural strengthening.  STM to cervical spine as appropriate; extend 2 x a week for 4 week to address remaining unmet and partially met goals.  Work on Editor, commissioning; turns, turning head with walking.    11:38 AM, 02/09/24 Wane Mollett Small Enis Riecke MPT [facility] [place] [phone]

## 2024-02-11 DIAGNOSIS — M9902 Segmental and somatic dysfunction of thoracic region: Secondary | ICD-10-CM | POA: Diagnosis not present

## 2024-02-11 DIAGNOSIS — M546 Pain in thoracic spine: Secondary | ICD-10-CM | POA: Diagnosis not present

## 2024-02-11 DIAGNOSIS — M542 Cervicalgia: Secondary | ICD-10-CM | POA: Diagnosis not present

## 2024-02-11 DIAGNOSIS — M62830 Muscle spasm of back: Secondary | ICD-10-CM | POA: Diagnosis not present

## 2024-02-11 DIAGNOSIS — M9901 Segmental and somatic dysfunction of cervical region: Secondary | ICD-10-CM | POA: Diagnosis not present

## 2024-02-11 DIAGNOSIS — M9903 Segmental and somatic dysfunction of lumbar region: Secondary | ICD-10-CM | POA: Diagnosis not present

## 2024-02-13 ENCOUNTER — Encounter (HOSPITAL_COMMUNITY): Payer: Self-pay

## 2024-02-13 ENCOUNTER — Ambulatory Visit (HOSPITAL_COMMUNITY)

## 2024-02-13 DIAGNOSIS — R2689 Other abnormalities of gait and mobility: Secondary | ICD-10-CM

## 2024-02-13 DIAGNOSIS — R42 Dizziness and giddiness: Secondary | ICD-10-CM

## 2024-02-13 NOTE — Therapy (Signed)
 OUTPATIENT PHYSICAL THERAPY VESTIBULAR PROGRESS NOTE Progress Note Reporting Period 12/30/23 to 02/09/24  See note below for Objective Data and Assessment of Progress/Goals.    Patient Name: Greg Mann MRN: [id number] DOB:[birth year], 78 y.o., male Today's Date: 02/13/2024  END OF SESSION:  PT End of Session - 02/13/24 1425     Visit Number 9    Number of Visits 8    Date for PT Re-Evaluation 03/12/24    Authorization Type HTA    PT Start Time 1430    PT Stop Time 1510    PT Time Calculation (min) 40 min    Activity Tolerance Patient tolerated treatment well    Behavior During Therapy [facility] for tasks assessed/performed              Past Medical History:  Diagnosis Date   Borderline hyperlipidemia    Chest pain    Dysrhythmia    Exercise-induced tachycardia    Ganglion    left wrist   GERD (gastroesophageal reflux disease)    Thrombocytopenia (HCC)    borderline   Past Surgical History:  Procedure Laterality Date   COLONOSCOPY N/A 04/22/2018   Procedure: COLONOSCOPY;  Surgeon: Suzette Espy, MD;  Location: [facility];  Service: Endoscopy;  Laterality: N/A;  8:30   GANGLION CYST EXCISION     Left Wrist   KNEE ARTHROSCOPY     Left   TONSILLECTOMY     Patient Active Problem List   Diagnosis Date Noted   RLL pneumonia 03/10/2022   Hypoalbuminemia 03/10/2022   Normocytic anemia 03/10/2022   Atrial flutter (HCC) 09/22/2013   Paroxysmal atrial fibrillation (HCC) 04/23/2013   Rapid palpitations 02/26/2013   FATIGUE 10/24/2009   THROMBOCYTOPENIA 10/11/2009   Chest pain 10/11/2009    PCP: Omie Bickers, MD REFERRING PROVIDER: Omie Bickers, MD  REFERRING DIAG: dizziness and giddiness  THERAPY DIAG:  Dizziness and giddiness  Other abnormalities of gait and mobility  ONSET DATE: 2 months or more  Rationale for Evaluation and Treatment: Rehabilitation  SUBJECTIVE:   SUBJECTIVE STATEMENT: Pt states he's been feeling a little better. Pt states he has  been trying to be more cautions with everything he has been doing.   Eval: After sitting a while he gets lightheaded; seems to trip himself; did have a fall forward about 2 months ago. He thinks he doesn't seem to have any balance issues.  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 No  PRECAUTIONS: Fall    WEIGHT BEARING RESTRICTIONS: No  FALLS: Has patient fallen in last 6 months? Yes. Number of falls 1   PLOF: Independent  PATIENT GOALS: help with my balance  OBJECTIVE:  Note: Objective measures were completed at Evaluation unless otherwise noted.  DIAGNOSTIC FINDINGS: none  COGNITION: Overall cognitive status: Within functional limits for tasks assessed   SENSATION: WFL    POSTURE:  rounded shoulders, forward head, and flexed trunk  unable to fully stand upright; limited lumbar extension  Cervical ROM:  limited cervical extension and rotation  Active AROM (deg) 01/16/24 AROM 02/09/24  Flexion 27 44  Extension 35 38  Right lateral flexion 22 28  Left lateral flexion 12 21  Right rotation 55 55  Left rotation 40 50  (Blank rows = not tested)  STRENGTH: not tested  LOWER EXTREMITY MMT:  MMT Right 01/16/24 Left 01/16/24  Hip flexion 4+ 4+  Hip abduction    Hip adduction    Hip internal rotation    Hip external rotation    Knee flexion    Knee extension 5 5  Ankle dorsiflexion 5 5  Ankle plantarflexion    Ankle inversion    Ankle eversion    (Blank rows = not tested)  BED MOBILITY:  Not tested  TRANSFERS: Assistive device utilized: None  Sit to stand: Modified independence Stand to sit: Modified independence Chair to chair: Modified independence Floor: not tested  GAIT: Gait pattern: decreased arm swing- Right, decreased arm swing- Left, trunk flexed, poor foot clearance- Right, and poor foot clearance-  Left Distance walked: 50 ft in clinic Assistive device utilized: None Level of assistance: Modified independence Comments: difficulty with turns; direction changes  FUNCTIONAL TESTS:  5 times sit to stand: 16.51 sec Dynamic Gait Index: 12/24 SLS 5" right and 5"  with light UE support DGI 1. Gait level surface (2) Mild Impairment: Walks 20', uses assistive devices, slower speed, mild gait deviations. 2. Change in gait speed (2) Mild Impairment: Is able to change speed but demonstrates mild gait deviations, or not gait deviations but unable to achieve a significant change in velocity, or uses an assistive device. 3. Gait with horizontal head turns (2) Mild Impairment: Performs head turns smoothly with slight change in gait velocity, i.e., minor disruption to smooth gait path or uses walking aid. 4. Gait with vertical head turns 1) Moderate Impairment: Performs head turns with moderate change in gait velocity, slows down, staggers but recovers, can continue to walk. 5. Gait and pivot turn (1) Moderate Impairment: Turns slowly, requires verbal cueing, requires several small steps to catch balance following turn and stop. 6. Step over obstacle (1) Moderate Impairment: Is able to step over box but must stop, then step over. May require verbal cueing. 7. Step around obstacles (1) Moderate Impairment: Is able to clear cones but must significantly slow, speed to accomplish task, or requires verbal cueing. 8. Stairs (2) Mild Impairment: Alternating feet, must use rail.  TOTAL SCORE: 12 / 24   PATIENT SURVEYS:  ABC scale 49.4%  VESTIBULAR ASSESSMENT:  GENERAL OBSERVATION: glasses, forward flexed trunk   SYMPTOM BEHAVIOR:  Subjective history: see above; glasses  Non-Vestibular symptoms: neck pain  Type of dizziness: Imbalance (Disequilibrium) and Lightheadedness/Faint  Frequency: varies  Duration: varies  Aggravating factors: sitting in choir room then standing up to go  upstairs  Relieving factors: rest  Progression of symptoms: unchanged  OCULOMOTOR EXAM:  Ocular Alignment: normal  Ocular ROM: No Limitations  Spontaneous Nystagmus: absent  Gaze-Induced Nystagmus: absent  Smooth Pursuits: intact  Saccades: intact  Convergence/Divergence: 8 inches   VESTIBULAR - OCULAR REFLEX:   Slow VOR: Comment: not tested  VOR Cancellation: Comment: not tested  Head-Impulse Test: not tested  Dynamic Visual Acuity: not tested   POSITIONAL TESTING: Other: not tested  MOTION SENSITIVITY:  Motion Sensitivity Quotient Intensity: 0 = none, 1 = Lightheaded, 2 = Mild, 3 = Moderate, 4 = Severe, 5 = Vomiting  Intensity  1. Sitting to supine   2. Supine to L side   3. Supine to R side   4. Supine to sitting   5. L Hallpike-Dix   6. Up from L    7. R Hallpike-Dix   8. Up from R    9. Sitting, head tipped to L knee 0  10. Head up from L knee 0  11. Sitting, head tipped to R knee 0  12. Head up from R knee 0  13. Sitting head turns x5 0  14.Sitting head nods x5 0  15. In stance, 180 turn to L    16. In stance, 180 turn to R     OTHOSTATICS: sitting 152/78; standing 142/76  FUNCTIONAL GAIT: see above                                                                                                                             TREATMENT DATE:  02/13/2024  Therapeutic Exercise: -Nustep, 5 minutes, level 4 resistance, 75 spm -Step ups, 6 inch step up and overs, 1 sets of 5 reps -Lateral step up and overs, 6 inch steps, 1 sets of 5 reps -Standing 3 way hip 1 sets 10 reps, bilaterally, pt cued for upright trunk and maintaining of neutral spine -Lateral stepping 3 laps 20 feet per lap, second 2 with RTB around ankles, pt cued for upright posture -Leg press, 2 sets of 8 reps, plate number 4, pt cued for eccentric control   Neuromuscular Re-education: -Walking marches and butt kicks with no UE support for balance challenge, 40 foot laps, 1 lap each variation, CGA  required for safety -Sit to stands, 2 sets of 10 reps, pt cued for core activation -SLS, 3 trial each side in Parallel bars, multiple corrections made bilaterally, pt cued to use UE correction rather than LE -Cone/hurdle obstacle course with 2 pound ankle weights on 20 foot line, 2 laps, weaving and stepping over for balance and coordination improvement -Aeromat walks, tandem and lateral stepping in parallel bars, 1 lap each variation, pt cued for decreased UE support, uses both UE for most of session   02/09/24 Progress note ABC scale 81.3% while sitting with moist heat AROM of cervical spine see above SLS left 8" and right 9" DGI 1. Gait level surface (2) Mild Impairment: Walks 20', uses assistive devices, slower speed, mild gait deviations. 2. Change in gait speed (2) Mild Impairment: Is able to change speed but demonstrates mild gait deviations, or not gait deviations but unable to achieve a significant change in velocity, or uses an assistive device. 3. Gait with horizontal head turns (2) Mild Impairment: Performs head turns smoothly with slight change in gait velocity, i.e., minor disruption to smooth gait path or uses walking aid. 4. Gait with vertical head turns (2) Mild Impairment: Performs head turns smoothly with slight change in gait velocity, i.e., minor disruption to smooth gait path or uses walking aid. 5. Gait and pivot turn (2) Mild Impairment: Pivot turns safely in > 3 seconds and stops with no loss of balance. 6. Step over obstacle (2) Mild Impairment: Is able to step over box, but must slow down and adjust steps to clear box safely. 7. Step around obstacles (2) Mild Impairment: Is able to step around both cones, but must slow down and adjust steps to clear cones. 8.  Stairs (2) Mild Impairment: Alternating feet, must use rail.  TOTAL SCORE: 16 / 24 Ended with Nustep level 3 x 5'  02/06/24 Seated thoracic extension 2 x 10 Standing lumbar extension 2 x 10 Slant board  5 x 20" Tandem stance 2 x 30" Foam beam stand with head turns and nods x 10 each light UE assist  Hip extension 2 x 10 each Hip abduction 2 x 10 each Nustep seat 10 level 3 x 5' to finish up treatment     PATIENT EDUCATION: Education details: Patient educated on exam findings, POC, scope of PT, HEP, and what to expect next visit. Person educated: Patient Education method: Explanation, Demonstration, and Handouts Education comprehension: verbalized understanding, returned demonstration, verbal cues required, and tactile cues required  HOME EXERCISE PROGRAM:' 02/04/24 - Standing Lumbar Extension with Counter  - 1 x daily - 7 x weekly - 1 sets - 10 reps 01/28/24 - Heel Raises with Counter Support  - 1 x daily - 7 x weekly - 2 sets - 10 reps - Standing Ankle Plantar Flexion Dorsiflexion with Counter Support  - 1 x daily - 7 x weekly - 2 sets - 10 reps - tandem stance balance; try not to hold on  - 1 x daily - 7 x weekly - 1 sets - 5 reps - 30 sec hold 01/22/24 Upper trap stretch  Access Code: [email or web address] URL: [email or web address] Date: 01/16/2024 Prepared by: [facility]  Exercises - Cervical Extension AROM with Strap  - 2 x daily - 7 x weekly - 1 sets - 10 reps - Seated Scapular Retraction  - 2 x daily - 7 x weekly - 1 sets - 10 reps - standing single leg balance at the counter (try to not hold on)  - 2 x daily - 7 x weekly - 1 sets - 10 reps - 15 sec hold - Seated Thoracic Lumbar Extension  - 2 x daily - 7 x weekly - 1 sets - 10 reps - Corner Pec Major Stretch  - 2 x daily - 7 x weekly - 1 sets - 5 reps - 10 sec hold - Seated Assisted Cervical Rotation with Towel  - 2 x daily - 7 x weekly - 1 sets - 10 reps - 10 sec hold - Seated Shoulder Shrugs  - 2 x daily - 7 x weekly - 2 sets - 10 reps  Access Code: [email or web address] URL: [email or web address] Date: 12/30/2023 Prepared by: [facility]  Exercises - Cervical Extension AROM with Strap  - 2 x daily - 7 x weekly  - 1 sets - 10 reps - Seated Scapular Retraction  - 2 x daily - 7 x weekly - 1 sets - 10 reps - standing single leg balance at the counter (try to not hold on)  - 2 x daily - 7 x weekly - 1 sets - 10 reps - 15 sec hold Pencil push ups GOALS: Goals reviewed with patient? No  SHORT TERM GOALS: Target date: 01/30/2024  patient will be independent with initial HEP  Baseline: Goal status: MET  2.  Patient will report 50% improvement overall  Baseline: 25% better Goal status: IN PROGRESS  3.  Patient will be able to stand SLS x 5" without UE support each leg to demonstrate improved functional balance Baseline: 5" each with light UE support Goal status: MET   LONG TERM GOALS: Target date: 02/13/2024  Patient will be independent in self management strategies to improve quality of life and  functional outcomes.  Baseline:  Goal status: IN PROGRESS  2.  Patient will report 75% improvement overall  Baseline:  Goal status: IN PROGRESS  3.  Patient will be able to stand SLS x 10" without UE support each leg to demonstrate improved functional balance Baseline: 8" on left and 9" on right 02/09/24 Goal status: IN PROGRESS  4.  Patient will improve DGI score by 5 points to demonstrate improve functional balance and gait Baseline: 12/24; 16/24 02/09/24 Goal status: IN PROGRESS  5.  Patient will remain free of falls Baseline: fell in the yard last week trimming bushes Goal status: IN PROGRESS  ASSESSMENT:  CLINICAL IMPRESSION: Patient continues to demonstrate decreased gait quality and balance especially with turning quickly. Progress LE strengthening to optimize pts strength as good leg musculature is a strength of pt. Pt continues to struggle with SLS trials bilaterally although pt states left leg seems a bit steadier. Patient able to progress dynamic balance and core activation exercises today with aero mat walks and ankle weight activities, good performance with verbal cueing. Patient  would continue to benefit from skilled physical therapy for increased LE strength, improved functional mobility, and improved balance for improved quality of life, maintained independence with gait training, decreased risk of falls and continued progress towards therapy goals.     Eval:Patient is a 78 y.o. male who was seen today for physical therapy evaluation and treatment for dizziness and giddiness. Patient demonstrates decreased spinal mobility; balance deficits and gait abnormalities which are negatively impacting patient ability to perform ADLs and functional mobility tasks. Patient does not appear to have BPPV.  Patient will benefit from skilled physical therapy services to address these deficits to improve level of function with ADLs, functional mobility tasks, and reduce risk for falls.    OBJECTIVE IMPAIRMENTS: Abnormal gait, decreased activity tolerance, decreased balance, decreased knowledge of condition, decreased mobility, difficulty walking, decreased ROM, and impaired perceived functional ability.   ACTIVITY LIMITATIONS: bending, standing, stairs, transfers, and locomotion level  PARTICIPATION LIMITATIONS: shopping and community activity  REHAB POTENTIAL: Good  CLINICAL DECISION MAKING: Evolving/moderate complexity  EVALUATION COMPLEXITY: Moderate   PLAN:  PT FREQUENCY: 2x/week  PT DURATION: 4 weeks  PLANNED INTERVENTIONS: 97164- PT Re-evaluation, 97110-Therapeutic exercises, 97530- Therapeutic activity, 97112- Neuromuscular re-education, 97535- Self Care, 91478- Manual therapy, [phone]- Gait training, [phone]- Orthotic Fit/training, [phone]- Canalith repositioning, [id number]- Aquatic Therapy, [phone]- Splinting, Patient/Family education, Balance training, Stair training, Taping, Dry Needling, Joint mobilization, Joint manipulation, Spinal manipulation, Spinal mobilization, Scar mobilization, and DME instructions.   PLAN FOR NEXT SESSION:   Balance; cervical mobility and postural  strengthening.  STM to cervical spine as appropriate; extend 2 x a week for 4 week to address remaining unmet and partially met goals.  Work on Editor, commissioning; turns, turning head with walking.    Armond Bertin, PT, DPT [facility] Office: [phone] 3:18 PM, 02/13/24

## 2024-02-19 ENCOUNTER — Ambulatory Visit (HOSPITAL_COMMUNITY)

## 2024-02-19 ENCOUNTER — Encounter (HOSPITAL_COMMUNITY): Payer: Self-pay

## 2024-02-19 DIAGNOSIS — R42 Dizziness and giddiness: Secondary | ICD-10-CM | POA: Diagnosis not present

## 2024-02-19 DIAGNOSIS — R2689 Other abnormalities of gait and mobility: Secondary | ICD-10-CM

## 2024-02-19 NOTE — Therapy (Signed)
 OUTPATIENT PHYSICAL THERAPY VESTIBULAR TREATMENT  Patient Name: Greg Mann MRN: [id number] DOB:05/08/1946, 78 y.o., male Today's Date: 02/19/2024  END OF SESSION:  PT End of Session - 02/19/24 1139     Visit Number 10    Date for PT Re-Evaluation 03/12/24    Authorization Type HTA    PT Start Time 1139    PT Stop Time 1220    PT Time Calculation (min) 41 min    Activity Tolerance Patient tolerated treatment well    Behavior During Therapy [facility] for tasks assessed/performed            Past Medical History:  Diagnosis Date   Borderline hyperlipidemia    Chest pain    Dysrhythmia    Exercise-induced tachycardia    Ganglion    left wrist   GERD (gastroesophageal reflux disease)    Thrombocytopenia (HCC)    borderline   Past Surgical History:  Procedure Laterality Date   COLONOSCOPY N/A 04/22/2018   Procedure: COLONOSCOPY;  Surgeon: Suzette Espy, MD;  Location: [facility];  Service: Endoscopy;  Laterality: N/A;  8:30   GANGLION CYST EXCISION     Left Wrist   KNEE ARTHROSCOPY     Left   TONSILLECTOMY     Patient Active Problem List   Diagnosis Date Noted   RLL pneumonia 03/10/2022   Hypoalbuminemia 03/10/2022   Normocytic anemia 03/10/2022   Atrial flutter (HCC) 09/22/2013   Paroxysmal atrial fibrillation (HCC) 04/23/2013   Rapid palpitations 02/26/2013   FATIGUE 10/24/2009   THROMBOCYTOPENIA 10/11/2009   Chest pain 10/11/2009    PCP: Omie Bickers, MD REFERRING PROVIDER: Omie Bickers, MD  REFERRING DIAG: dizziness and giddiness  THERAPY DIAG:  Dizziness and giddiness  Other abnormalities of gait and mobility  ONSET DATE: 2 months or more  Rationale for Evaluation and Treatment: Rehabilitation  SUBJECTIVE:   SUBJECTIVE STATEMENT: Pt reports balance feeling about the same but that his neck pain has sort of radiated down into his mid back. Pt states he sometimes feels strange when in the shower and his eyes are closed.   Eval: After sitting  a while he gets lightheaded; seems to trip himself; did have a fall forward about 2 months ago. He thinks he doesn't seem to have any balance issues.  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 No  PRECAUTIONS: Fall    WEIGHT BEARING RESTRICTIONS: No  FALLS: Has patient fallen in last 6 months? Yes. Number of falls 1   PLOF: Independent  PATIENT GOALS: help with my balance  OBJECTIVE:  Note: Objective measures were completed at Evaluation unless otherwise noted.  DIAGNOSTIC FINDINGS: none  COGNITION: Overall cognitive status: Within functional limits for tasks assessed   SENSATION: WFL    POSTURE:  rounded shoulders, forward head, and flexed trunk  unable to fully stand upright; limited lumbar extension  Cervical ROM:  limited cervical extension and rotation  Active AROM (deg) 01/16/24 AROM 02/09/24  Flexion 27 44  Extension 35 38  Right lateral flexion 22 28  Left lateral flexion 12 21  Right rotation 55 55  Left rotation 40 50  (Blank rows = not tested)  STRENGTH: not tested  LOWER EXTREMITY MMT:   MMT Right 01/16/24 Left 01/16/24  Hip flexion 4+ 4+  Hip abduction  Hip adduction    Hip internal rotation    Hip external rotation    Knee flexion    Knee extension 5 5  Ankle dorsiflexion 5 5  Ankle plantarflexion    Ankle inversion    Ankle eversion    (Blank rows = not tested)  BED MOBILITY:  Not tested  TRANSFERS: Assistive device utilized: None  Sit to stand: Modified independence Stand to sit: Modified independence Chair to chair: Modified independence Floor: not tested  GAIT: Gait pattern: decreased arm swing- Right, decreased arm swing- Left, trunk flexed, poor foot clearance- Right, and poor foot clearance- Left Distance walked: 50 ft in clinic Assistive device utilized: None Level of  assistance: Modified independence Comments: difficulty with turns; direction changes  FUNCTIONAL TESTS:  5 times sit to stand: 16.51 sec Dynamic Gait Index: 12/24 SLS 5 right and 5  with light UE support DGI 1. Gait level surface (2) Mild Impairment: Walks 20', uses assistive devices, slower speed, mild gait deviations. 2. Change in gait speed (2) Mild Impairment: Is able to change speed but demonstrates mild gait deviations, or not gait deviations but unable to achieve a significant change in velocity, or uses an assistive device. 3. Gait with horizontal head turns (2) Mild Impairment: Performs head turns smoothly with slight change in gait velocity, i.e., minor disruption to smooth gait path or uses walking aid. 4. Gait with vertical head turns 1) Moderate Impairment: Performs head turns with moderate change in gait velocity, slows down, staggers but recovers, can continue to walk. 5. Gait and pivot turn (1) Moderate Impairment: Turns slowly, requires verbal cueing, requires several small steps to catch balance following turn and stop. 6. Step over obstacle (1) Moderate Impairment: Is able to step over box but must stop, then step over. May require verbal cueing. 7. Step around obstacles (1) Moderate Impairment: Is able to clear cones but must significantly slow, speed to accomplish task, or requires verbal cueing. 8. Stairs (2) Mild Impairment: Alternating feet, must use rail.  TOTAL SCORE: 12 / 24   PATIENT SURVEYS:  ABC scale 49.4%  VESTIBULAR ASSESSMENT:  GENERAL OBSERVATION: glasses, forward flexed trunk   SYMPTOM BEHAVIOR:  Subjective history: see above; glasses  Non-Vestibular symptoms: neck pain  Type of dizziness: Imbalance (Disequilibrium) and Lightheadedness/Faint  Frequency: varies  Duration: varies  Aggravating factors: sitting in choir room then standing up to go upstairs  Relieving factors: rest  Progression of symptoms: unchanged  OCULOMOTOR  EXAM:  Ocular Alignment: normal  Ocular ROM: No Limitations  Spontaneous Nystagmus: absent  Gaze-Induced Nystagmus: absent  Smooth Pursuits: intact  Saccades: intact  Convergence/Divergence: 8 inches   VESTIBULAR - OCULAR REFLEX:   Slow VOR: Comment: not tested  VOR Cancellation: Comment: not tested  Head-Impulse Test: not tested  Dynamic Visual Acuity: not tested   POSITIONAL TESTING: Other: not tested  MOTION SENSITIVITY:  Motion Sensitivity Quotient Intensity: 0 = none, 1 = Lightheaded, 2 = Mild, 3 = Moderate, 4 = Severe, 5 = Vomiting  Intensity  1. Sitting to supine   2. Supine to L side   3. Supine to R side   4. Supine to sitting   5. L Hallpike-Dix   6. Up from L    7. R Hallpike-Dix   8. Up from R    9. Sitting, head tipped to L knee 0  10. Head up from L knee 0  11. Sitting, head tipped to R knee 0  12. Head up from R knee  0  13. Sitting head turns x5 0  14.Sitting head nods x5 0  15. In stance, 180 turn to L    16. In stance, 180 turn to R     OTHOSTATICS: sitting 152/78; standing 142/76  FUNCTIONAL GAIT: see above                                                                                                                             TREATMENT DATE:  02/19/2024  Therapeutic Exercise: -Treadmill, 4 minutes, level 4 resistance, 1.5 for 3 minutes, 1.8 for last minute, pt states Les are worn out -Cervical AROM, flexion/extension and rotations, 1 set of 10 reps  -Speed step ups, 6 inch step up and overs, 2 sets of 30 seconds, 11 reps, 14 reps   Neuromuscular Re-education: -Agility ladder, 11 minutes for motor planning with LE placement, diagonal, 2 step each square horizontal/vertical pattern, 1 lap each variation -Chair to mat transfer, with tidal tank, 1 sets of 10 reps, pt cued for core activation  Manual: -STM to cervical spine and lumbar paraspinals bilaterally, in prone position -grade II, III to cervical spinal segments C3-C7 and lumbar spinal  segments, L1-L5  02/13/2024  Therapeutic Exercise: -Nustep, 5 minutes, level 4 resistance, 75 spm -Step ups, 6 inch step up and overs, 1 sets of 5 reps -Lateral step up and overs, 6 inch steps, 1 sets of 5 reps -Standing 3 way hip 1 sets 10 reps, bilaterally, pt cued for upright trunk and maintaining of neutral spine -Lateral stepping 3 laps 20 feet per lap, second 2 with RTB around ankles, pt cued for upright posture -Leg press, 2 sets of 8 reps, plate number 4, pt cued for eccentric control   Neuromuscular Re-education: -Walking marches and butt kicks with no UE support for balance challenge, 40 foot laps, 1 lap each variation, CGA required for safety -Sit to stands, 2 sets of 10 reps, pt cued for core activation -SLS, 3 trial each side in Parallel bars, multiple corrections made bilaterally, pt cued to use UE correction rather than LE -Cone/hurdle obstacle course with 2 pound ankle weights on 20 foot line, 2 laps, weaving and stepping over for balance and coordination improvement -Aeromat walks, tandem and lateral stepping in parallel bars, 1 lap each variation, pt cued for decreased UE support, uses both UE for most of session   02/09/24 Progress note ABC scale 81.3% while sitting with moist heat AROM of cervical spine see above SLS left 8 and right 9 DGI 1. Gait level surface (2) Mild Impairment: Walks 20', uses assistive devices, slower speed, mild gait deviations. 2. Change in gait speed (2) Mild Impairment: Is able to change speed but demonstrates mild gait deviations, or not gait deviations but unable to achieve a significant change in velocity, or uses an assistive device. 3. Gait with horizontal head turns (2) Mild Impairment: Performs head turns smoothly with slight change in gait velocity, i.e., minor disruption to smooth  gait path or uses walking aid. 4. Gait with vertical head turns (2) Mild Impairment: Performs head turns smoothly with slight change in gait velocity,  i.e., minor disruption to smooth gait path or uses walking aid. 5. Gait and pivot turn (2) Mild Impairment: Pivot turns safely in > 3 seconds and stops with no loss of balance. 6. Step over obstacle (2) Mild Impairment: Is able to step over box, but must slow down and adjust steps to clear box safely. 7. Step around obstacles (2) Mild Impairment: Is able to step around both cones, but must slow down and adjust steps to clear cones. 8. Stairs (2) Mild Impairment: Alternating feet, must use rail.  TOTAL SCORE: 16 / 24 Ended with Nustep level 3 x 5'    PATIENT EDUCATION: Education details: Patient educated on exam findings, POC, scope of PT, HEP, and what to expect next visit. Person educated: Patient Education method: Explanation, Demonstration, and Handouts Education comprehension: verbalized understanding, returned demonstration, verbal cues required, and tactile cues required  HOME EXERCISE PROGRAM:' 02/04/24 - Standing Lumbar Extension with Counter  - 1 x daily - 7 x weekly - 1 sets - 10 reps 01/28/24 - Heel Raises with Counter Support  - 1 x daily - 7 x weekly - 2 sets - 10 reps - Standing Ankle Plantar Flexion Dorsiflexion with Counter Support  - 1 x daily - 7 x weekly - 2 sets - 10 reps - tandem stance balance; try not to hold on  - 1 x daily - 7 x weekly - 1 sets - 5 reps - 30 sec hold 01/22/24 Upper trap stretch  Access Code: [email or web address] URL: [email or web address] Date: 01/16/2024 Prepared by: [facility]  Exercises - Cervical Extension AROM with Strap  - 2 x daily - 7 x weekly - 1 sets - 10 reps - Seated Scapular Retraction  - 2 x daily - 7 x weekly - 1 sets - 10 reps - standing single leg balance at the counter (try to not hold on)  - 2 x daily - 7 x weekly - 1 sets - 10 reps - 15 sec hold - Seated Thoracic Lumbar Extension  - 2 x daily - 7 x weekly - 1 sets - 10 reps - Corner Pec Major Stretch  - 2 x daily - 7 x weekly - 1 sets - 5 reps - 10 sec hold -  Seated Assisted Cervical Rotation with Towel  - 2 x daily - 7 x weekly - 1 sets - 10 reps - 10 sec hold - Seated Shoulder Shrugs  - 2 x daily - 7 x weekly - 2 sets - 10 reps  Access Code: [email or web address] URL: [email or web address] Date: 12/30/2023 Prepared by: [facility]  Exercises - Cervical Extension AROM with Strap  - 2 x daily - 7 x weekly - 1 sets - 10 reps - Seated Scapular Retraction  - 2 x daily - 7 x weekly - 1 sets - 10 reps - standing single leg balance at the counter (try to not hold on)  - 2 x daily - 7 x weekly - 1 sets - 10 reps - 15 sec hold Pencil push ups GOALS: Goals reviewed with patient? No  SHORT TERM GOALS: Target date: 01/30/2024  patient will be independent with initial HEP  Baseline: Goal status: MET  2.  Patient will report 50% improvement overall  Baseline: 25% better Goal status: IN PROGRESS  3.  Patient will be able to stand SLS x  5 without UE support each leg to demonstrate improved functional balance Baseline: 5 each with light UE support Goal status: MET   LONG TERM GOALS: Target date: 02/13/2024  Patient will be independent in self management strategies to improve quality of life and functional outcomes.  Baseline:  Goal status: IN PROGRESS  2.  Patient will report 75% improvement overall  Baseline:  Goal status: IN PROGRESS  3.  Patient will be able to stand SLS x 10 without UE support each leg to demonstrate improved functional balance Baseline: 8 on left and 9 on right 02/09/24 Goal status: IN PROGRESS  4.  Patient will improve DGI score by 5 points to demonstrate improve functional balance and gait Baseline: 12/24; 16/24 02/09/24 Goal status: IN PROGRESS  5.  Patient will remain free of falls Baseline: fell in the yard last week trimming bushes Goal status: IN PROGRESS  ASSESSMENT:  CLINICAL IMPRESSION: Patient continues to demonstrate decreased LE endurance, decreased gait quality and balance. Patient also  demonstrates decreased endurance with walking due to LE musculature as no SOB noted. Patient able to progress dynamic balance and core activation exercises today with agility ladder activities with need for multiple demonstrations and verbal cues for correct movement. Pt verbally agrees that he has trouble with multitasking and motor planning during these activities. Pt continues to demonstrate low back pain throughout session but neck pain subsided following manual. Patient would continue to benefit from skilled physical therapy for increased confidence with ambulation, increased LE endurance, and improved balance for improved quality of life, improved independence with gait training and continued progress towards therapy goals.      Eval:Patient is a 78 y.o. male who was seen today for physical therapy evaluation and treatment for dizziness and giddiness. Patient demonstrates decreased spinal mobility; balance deficits and gait abnormalities which are negatively impacting patient ability to perform ADLs and functional mobility tasks. Patient does not appear to have BPPV.  Patient will benefit from skilled physical therapy services to address these deficits to improve level of function with ADLs, functional mobility tasks, and reduce risk for falls.    OBJECTIVE IMPAIRMENTS: Abnormal gait, decreased activity tolerance, decreased balance, decreased knowledge of condition, decreased mobility, difficulty walking, decreased ROM, and impaired perceived functional ability.   ACTIVITY LIMITATIONS: bending, standing, stairs, transfers, and locomotion level  PARTICIPATION LIMITATIONS: shopping and community activity  REHAB POTENTIAL: Good  CLINICAL DECISION MAKING: Evolving/moderate complexity  EVALUATION COMPLEXITY: Moderate   PLAN:  PT FREQUENCY: 2x/week  PT DURATION: 4 weeks  PLANNED INTERVENTIONS: 97164- PT Re-evaluation, 97110-Therapeutic exercises, 97530- Therapeutic activity, 97112-  Neuromuscular re-education, 97535- Self Care, 60454- Manual therapy, [phone]- Gait training, [phone]- Orthotic Fit/training, [phone]- Canalith repositioning, [id number]- Aquatic Therapy, [phone]- Splinting, Patient/Family education, Balance training, Stair training, Taping, Dry Needling, Joint mobilization, Joint manipulation, Spinal manipulation, Spinal mobilization, Scar mobilization, and DME instructions.   PLAN FOR NEXT SESSION:   Balance; cervical mobility and postural strengthening.  STM to cervical spine as appropriate; extend 2 x a week for 4 week to address remaining unmet and partially met goals.  Work on Editor, commissioning; turns, turning head with walking.    Armond Bertin, PT, DPT [facility] Office: [phone] 12:33 PM, 02/19/24

## 2024-02-23 DIAGNOSIS — H25813 Combined forms of age-related cataract, bilateral: Secondary | ICD-10-CM | POA: Diagnosis not present

## 2024-02-25 ENCOUNTER — Encounter (HOSPITAL_COMMUNITY): Payer: Self-pay

## 2024-02-25 ENCOUNTER — Ambulatory Visit (HOSPITAL_COMMUNITY)

## 2024-02-25 DIAGNOSIS — R42 Dizziness and giddiness: Secondary | ICD-10-CM

## 2024-02-25 DIAGNOSIS — R2689 Other abnormalities of gait and mobility: Secondary | ICD-10-CM

## 2024-02-25 NOTE — Therapy (Signed)
 OUTPATIENT PHYSICAL THERAPY VESTIBULAR TREATMENT  Patient Name: Greg Mann MRN: [id number] DOB:July 29, 1946, 78 y.o., male Today's Date: 02/25/2024  END OF SESSION:  PT End of Session - 02/25/24 1105     Visit Number 11    Number of Visits 8    Date for PT Re-Evaluation 03/12/24    Authorization Type HTA    PT Start Time 1105    PT Stop Time 1145    PT Time Calculation (min) 40 min    Activity Tolerance Patient tolerated treatment well    Behavior During Therapy WFL for tasks assessed/performed             Past Medical History:  Diagnosis Date   Borderline hyperlipidemia    Chest pain    Dysrhythmia    Exercise-induced tachycardia    Ganglion    left wrist   GERD (gastroesophageal reflux disease)    Thrombocytopenia (HCC)    borderline   Past Surgical History:  Procedure Laterality Date   COLONOSCOPY N/A 04/22/2018   Procedure: COLONOSCOPY;  Surgeon: Suzette Espy, MD;  Location: [facility];  Service: Endoscopy;  Laterality: N/A;  8:30   GANGLION CYST EXCISION     Left Wrist   KNEE ARTHROSCOPY     Left   TONSILLECTOMY     Patient Active Problem List   Diagnosis Date Noted   RLL pneumonia 03/10/2022   Hypoalbuminemia 03/10/2022   Normocytic anemia 03/10/2022   Atrial flutter (HCC) 09/22/2013   Paroxysmal atrial fibrillation (HCC) 04/23/2013   Rapid palpitations 02/26/2013   FATIGUE 10/24/2009   THROMBOCYTOPENIA 10/11/2009   Chest pain 10/11/2009    PCP: Omie Bickers, MD REFERRING PROVIDER: Omie Bickers, MD  REFERRING DIAG: dizziness and giddiness  THERAPY DIAG:  Dizziness and giddiness  Other abnormalities of gait and mobility  ONSET DATE: 2 months or more  Rationale for Evaluation and Treatment: Rehabilitation  SUBJECTIVE:   SUBJECTIVE STATEMENT: Pt states he is feeling pretty good today, pt states he has already performed HEP today. Pt states leaning back on counter exercise has been helping back feel better. Pt states he is about  to have cataract surgery later this year.  Eval: After sitting a while he gets lightheaded; seems to trip himself; did have a fall forward about 2 months ago. He thinks he doesn't seem to have any balance issues.  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 No  PRECAUTIONS: Fall    WEIGHT BEARING RESTRICTIONS: No  FALLS: Has patient fallen in last 6 months? Yes. Number of falls 1   PLOF: Independent  PATIENT GOALS: help with my balance  OBJECTIVE:  Note: Objective measures were completed at Evaluation unless otherwise noted.  DIAGNOSTIC FINDINGS: none  COGNITION: Overall cognitive status: Within functional limits for tasks assessed   SENSATION: WFL    POSTURE:  rounded shoulders, forward head, and flexed trunk  unable to fully stand upright; limited lumbar extension  Cervical ROM:  limited cervical extension and rotation  Active AROM (deg) 01/16/24 AROM 02/09/24  Flexion 27 44  Extension 35 38  Right lateral flexion 22 28  Left lateral flexion 12 21  Right rotation 55 55  Left rotation 40 50  (Blank rows = not tested)  STRENGTH: not tested  LOWER EXTREMITY MMT:   MMT Right 01/16/24 Left  01/16/24  Hip flexion 4+ 4+  Hip abduction    Hip adduction    Hip internal rotation    Hip external rotation    Knee flexion    Knee extension 5 5  Ankle dorsiflexion 5 5  Ankle plantarflexion    Ankle inversion    Ankle eversion    (Blank rows = not tested)  BED MOBILITY:  Not tested  TRANSFERS: Assistive device utilized: None  Sit to stand: Modified independence Stand to sit: Modified independence Chair to chair: Modified independence Floor: not tested  GAIT: Gait pattern: decreased arm swing- Right, decreased arm swing- Left, trunk flexed, poor foot clearance- Right, and poor foot clearance- Left Distance walked: 50  ft in clinic Assistive device utilized: None Level of assistance: Modified independence Comments: difficulty with turns; direction changes  FUNCTIONAL TESTS:  5 times sit to stand: 16.51 sec Dynamic Gait Index: 12/24 SLS 5 right and 5  with light UE support DGI 1. Gait level surface (2) Mild Impairment: Walks 20', uses assistive devices, slower speed, mild gait deviations. 2. Change in gait speed (2) Mild Impairment: Is able to change speed but demonstrates mild gait deviations, or not gait deviations but unable to achieve a significant change in velocity, or uses an assistive device. 3. Gait with horizontal head turns (2) Mild Impairment: Performs head turns smoothly with slight change in gait velocity, i.e., minor disruption to smooth gait path or uses walking aid. 4. Gait with vertical head turns 1) Moderate Impairment: Performs head turns with moderate change in gait velocity, slows down, staggers but recovers, can continue to walk. 5. Gait and pivot turn (1) Moderate Impairment: Turns slowly, requires verbal cueing, requires several small steps to catch balance following turn and stop. 6. Step over obstacle (1) Moderate Impairment: Is able to step over box but must stop, then step over. May require verbal cueing. 7. Step around obstacles (1) Moderate Impairment: Is able to clear cones but must significantly slow, speed to accomplish task, or requires verbal cueing. 8. Stairs (2) Mild Impairment: Alternating feet, must use rail.  TOTAL SCORE: 12 / 24   PATIENT SURVEYS:  ABC scale 49.4%  VESTIBULAR ASSESSMENT:  GENERAL OBSERVATION: glasses, forward flexed trunk   SYMPTOM BEHAVIOR:  Subjective history: see above; glasses  Non-Vestibular symptoms: neck pain  Type of dizziness: Imbalance (Disequilibrium) and Lightheadedness/Faint  Frequency: varies  Duration: varies  Aggravating factors: sitting in choir room then standing up to go upstairs  Relieving factors:  rest  Progression of symptoms: unchanged  OCULOMOTOR EXAM:  Ocular Alignment: normal  Ocular ROM: No Limitations  Spontaneous Nystagmus: absent  Gaze-Induced Nystagmus: absent  Smooth Pursuits: intact  Saccades: intact  Convergence/Divergence: 8 inches   VESTIBULAR - OCULAR REFLEX:   Slow VOR: Comment: not tested  VOR Cancellation: Comment: not tested  Head-Impulse Test: not tested  Dynamic Visual Acuity: not tested   POSITIONAL TESTING: Other: not tested  MOTION SENSITIVITY:  Motion Sensitivity Quotient Intensity: 0 = none, 1 = Lightheaded, 2 = Mild, 3 = Moderate, 4 = Severe, 5 = Vomiting  Intensity  1. Sitting to supine   2. Supine to L side   3. Supine to R side   4. Supine to sitting   5. L Hallpike-Dix   6. Up from L    7. R Hallpike-Dix   8. Up from R    9. Sitting, head tipped to L knee 0  10. Head up from L knee 0  11. Sitting, head  tipped to R knee 0  12. Head up from R knee 0  13. Sitting head turns x5 0  14.Sitting head nods x5 0  15. In stance, 180 turn to L    16. In stance, 180 turn to R     OTHOSTATICS: sitting 152/78; standing 142/76  FUNCTIONAL GAIT: see above                                                                                                                             TREATMENT DATE:  02/25/2024  Therapeutic Exercise: -Treadmill, 5 minutes, 2.0 grade, 1.5 for 3 minutes, 1.9 for last 2 minutes, pt states LEs are worn out -Standing hip extensions, 2 sets of 8 reps, RTB at ankles   Neuromuscular Re-education: -Rocker board, side to side and forward and backward, pt demonstrates increased difficulty and requires BUE support for slow controlled weight shifts -Lateral stepping, 2 lap, RTB at ankles, in parallel bars, one UE support on occasion -Monster walks, 2 lap, RTB at ankles, in parallel bars, one UE support on occasion -Forward lunges on bosu ball, 1 set of 7 reps bilaterally, BUE support required -Lateral step up and overs,  12 inch step, 1 set of 6 reps, bilaterally, BUE support utilized -Sit to stands, 2 sets of 3 rep, with yellow ball trampoline toss (3 per sts)    02/19/2024  Therapeutic Exercise: -Treadmill, 4 minutes, level 4 resistance, 1.5 for 3 minutes, 1.8 for last minute, pt states Les are worn out -Cervical AROM, flexion/extension and rotations, 1 set of 10 reps  -Speed step ups, 6 inch step up and overs, 2 sets of 30 seconds, 11 reps, 14 reps   Neuromuscular Re-education: -Agility ladder, 11 minutes for motor planning with LE placement, diagonal, 2 step each square horizontal/vertical pattern, 1 lap each variation -Chair to mat transfer, with tidal tank, 1 sets of 10 reps, pt cued for core activation  Manual: -STM to cervical spine and lumbar paraspinals bilaterally, in prone position -grade II, III to cervical spinal segments C3-C7 and lumbar spinal segments, L1-L5  02/13/2024  Therapeutic Exercise: -Nustep, 5 minutes, level 4 resistance, 75 spm -Step ups, 6 inch step up and overs, 1 sets of 5 reps -Lateral step up and overs, 6 inch steps, 1 sets of 5 reps -Standing 3 way hip 1 sets 10 reps, bilaterally, pt cued for upright trunk and maintaining of neutral spine -Lateral stepping 3 laps 20 feet per lap, second 2 with RTB around ankles, pt cued for upright posture -Leg press, 2 sets of 8 reps, plate number 4, pt cued for eccentric control   Neuromuscular Re-education: -Walking marches and butt kicks with no UE support for balance challenge, 40 foot laps, 1 lap each variation, CGA required for safety -Sit to stands, 2 sets of 10 reps, pt cued for core activation -SLS, 3 trial each side in Parallel bars, multiple corrections made bilaterally, pt cued to use UE correction rather than  LE -Cone/hurdle obstacle course with 2 pound ankle weights on 20 foot line, 2 laps, weaving and stepping over for balance and coordination improvement -Aeromat walks, tandem and lateral stepping in parallel bars, 1  lap each variation, pt cued for decreased UE support, uses both UE for most of session    PATIENT EDUCATION: Education details: Patient educated on exam findings, POC, scope of PT, HEP, and what to expect next visit. Person educated: Patient Education method: Explanation, Demonstration, and Handouts Education comprehension: verbalized understanding, returned demonstration, verbal cues required, and tactile cues required  HOME EXERCISE PROGRAM:' 02/04/24 - Standing Lumbar Extension with Counter  - 1 x daily - 7 x weekly - 1 sets - 10 reps 01/28/24 - Heel Raises with Counter Support  - 1 x daily - 7 x weekly - 2 sets - 10 reps - Standing Ankle Plantar Flexion Dorsiflexion with Counter Support  - 1 x daily - 7 x weekly - 2 sets - 10 reps - tandem stance balance; try not to hold on  - 1 x daily - 7 x weekly - 1 sets - 5 reps - 30 sec hold 01/22/24 Upper trap stretch  Access Code: [email or web address] URL: [email or web address] Date: 01/16/2024 Prepared by: [facility]  Exercises - Cervical Extension AROM with Strap  - 2 x daily - 7 x weekly - 1 sets - 10 reps - Seated Scapular Retraction  - 2 x daily - 7 x weekly - 1 sets - 10 reps - standing single leg balance at the counter (try to not hold on)  - 2 x daily - 7 x weekly - 1 sets - 10 reps - 15 sec hold - Seated Thoracic Lumbar Extension  - 2 x daily - 7 x weekly - 1 sets - 10 reps - Corner Pec Major Stretch  - 2 x daily - 7 x weekly - 1 sets - 5 reps - 10 sec hold - Seated Assisted Cervical Rotation with Towel  - 2 x daily - 7 x weekly - 1 sets - 10 reps - 10 sec hold - Seated Shoulder Shrugs  - 2 x daily - 7 x weekly - 2 sets - 10 reps  Access Code: [email or web address] URL: [email or web address] Date: 12/30/2023 Prepared by: [facility]  Exercises - Cervical Extension AROM with Strap  - 2 x daily - 7 x weekly - 1 sets - 10 reps - Seated Scapular Retraction  - 2 x daily - 7 x weekly - 1 sets - 10 reps - standing single leg  balance at the counter (try to not hold on)  - 2 x daily - 7 x weekly - 1 sets - 10 reps - 15 sec hold Pencil push ups GOALS: Goals reviewed with patient? No  SHORT TERM GOALS: Target date: 01/30/2024  patient will be independent with initial HEP  Baseline: Goal status: MET  2.  Patient will report 50% improvement overall  Baseline: 25% better Goal status: IN PROGRESS  3.  Patient will be able to stand SLS x 5 without UE support each leg to demonstrate improved functional balance Baseline: 5 each with light UE support Goal status: MET   LONG TERM GOALS: Target date: 02/13/2024  Patient will be independent in self management strategies to improve quality of life and functional outcomes.  Baseline:  Goal status: IN PROGRESS  2.  Patient will report 75% improvement overall  Baseline:  Goal status: IN PROGRESS  3.  Patient will be able to stand SLS  x 10 without UE support each leg to demonstrate improved functional balance Baseline: 8 on left and 9 on right 02/09/24 Goal status: IN PROGRESS  4.  Patient will improve DGI score by 5 points to demonstrate improve functional balance and gait Baseline: 12/24; 16/24 02/09/24 Goal status: IN PROGRESS  5.  Patient will remain free of falls Baseline: fell in the yard last week trimming bushes Goal status: IN PROGRESS  ASSESSMENT:  CLINICAL IMPRESSION: Treatment focused on primarily LE strengthening and balance during todays session as pt states no dizziness present upon presentation and no neck pain. Patient continues to demonstrate decreased LE strength, decreased gait quality and balance. Patient also demonstrates decreased endurance with aerobic based exercise during today's session with multiple sitting breaks needed throughout session. Patient able to progress dynamic balance and core activation exercises today with lunge variation and banded walks, good performance with verbal cueing. Patient would continue to benefit from  skilled physical therapy for decreased giddiness on feet, increased endurance with ambulation, increased LE strength, and improved balance for improved quality of life, improved independence with community ambulation and continued progress towards therapy goals.       Eval:Patient is a 78 y.o. male who was seen today for physical therapy evaluation and treatment for dizziness and giddiness. Patient demonstrates decreased spinal mobility; balance deficits and gait abnormalities which are negatively impacting patient ability to perform ADLs and functional mobility tasks. Patient does not appear to have BPPV.  Patient will benefit from skilled physical therapy services to address these deficits to improve level of function with ADLs, functional mobility tasks, and reduce risk for falls.    OBJECTIVE IMPAIRMENTS: Abnormal gait, decreased activity tolerance, decreased balance, decreased knowledge of condition, decreased mobility, difficulty walking, decreased ROM, and impaired perceived functional ability.   ACTIVITY LIMITATIONS: bending, standing, stairs, transfers, and locomotion level  PARTICIPATION LIMITATIONS: shopping and community activity  REHAB POTENTIAL: Good  CLINICAL DECISION MAKING: Evolving/moderate complexity  EVALUATION COMPLEXITY: Moderate   PLAN:  PT FREQUENCY: 2x/week  PT DURATION: 4 weeks  PLANNED INTERVENTIONS: 97164- PT Re-evaluation, 97110-Therapeutic exercises, 97530- Therapeutic activity, 97112- Neuromuscular re-education, 97535- Self Care, 52841- Manual therapy, [phone]- Gait training, [phone]- Orthotic Fit/training, [phone]- Canalith repositioning, [id number]- Aquatic Therapy, [phone]- Splinting, Patient/Family education, Balance training, Stair training, Taping, Dry Needling, Joint mobilization, Joint manipulation, Spinal manipulation, Spinal mobilization, Scar mobilization, and DME instructions.   PLAN FOR NEXT SESSION:   Balance; cervical mobility and postural  strengthening.  STM to cervical spine as appropriate; extend 2 x a week for 4 week to address remaining unmet and partially met goals.  Work on Editor, commissioning; turns, turning head with walking.    Armond Bertin, PT, DPT [facility] Office: [phone] 2:01 PM, 02/25/24

## 2024-02-27 ENCOUNTER — Ambulatory Visit (HOSPITAL_COMMUNITY)

## 2024-02-27 DIAGNOSIS — R42 Dizziness and giddiness: Secondary | ICD-10-CM

## 2024-02-27 DIAGNOSIS — R2689 Other abnormalities of gait and mobility: Secondary | ICD-10-CM

## 2024-02-27 NOTE — Therapy (Signed)
 OUTPATIENT PHYSICAL THERAPY VESTIBULAR TREATMENT  Patient Name: Greg Mann MRN: [id number] DOB:November 24, 1945, 78 y.o., male Today's Date: 02/27/2024  END OF SESSION:  PT End of Session - 02/27/24 0802     Visit Number 12    Number of Visits 16    Date for PT Re-Evaluation 03/12/24    Authorization Type HTA    PT Start Time 0802    PT Stop Time 0842    PT Time Calculation (min) 40 min    Activity Tolerance Patient tolerated treatment well    Behavior During Therapy [facility] for tasks assessed/performed             Past Medical History:  Diagnosis Date   Borderline hyperlipidemia    Chest pain    Dysrhythmia    Exercise-induced tachycardia    Ganglion    left wrist   GERD (gastroesophageal reflux disease)    Thrombocytopenia (HCC)    borderline   Past Surgical History:  Procedure Laterality Date   COLONOSCOPY N/A 04/22/2018   Procedure: COLONOSCOPY;  Surgeon: Suzette Espy, MD;  Location: [facility];  Service: Endoscopy;  Laterality: N/A;  8:30   GANGLION CYST EXCISION     Left Wrist   KNEE ARTHROSCOPY     Left   TONSILLECTOMY     Patient Active Problem List   Diagnosis Date Noted   RLL pneumonia 03/10/2022   Hypoalbuminemia 03/10/2022   Normocytic anemia 03/10/2022   Atrial flutter (HCC) 09/22/2013   Paroxysmal atrial fibrillation (HCC) 04/23/2013   Rapid palpitations 02/26/2013   FATIGUE 10/24/2009   THROMBOCYTOPENIA 10/11/2009   Chest pain 10/11/2009    PCP: Omie Bickers, MD REFERRING PROVIDER: Omie Bickers, MD  REFERRING DIAG: dizziness and giddiness  THERAPY DIAG:  Dizziness and giddiness  Other abnormalities of gait and mobility  ONSET DATE: 2 months or more  Rationale for Evaluation and Treatment: Rehabilitation  SUBJECTIVE:   SUBJECTIVE STATEMENT: Noticed his neck hurting some last night while shopping at [place].  No new falls  Eval: After sitting a while he gets lightheaded; seems to trip himself; did have a fall forward about 2  months ago. He thinks he doesn't seem to have any balance issues.  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 No  PRECAUTIONS: Fall    WEIGHT BEARING RESTRICTIONS: No  FALLS: Has patient fallen in last 6 months? Yes. Number of falls 1   PLOF: Independent  PATIENT GOALS: help with my balance  OBJECTIVE:  Note: Objective measures were completed at Evaluation unless otherwise noted.  DIAGNOSTIC FINDINGS: none  COGNITION: Overall cognitive status: Within functional limits for tasks assessed   SENSATION: WFL    POSTURE:  rounded shoulders, forward head, and flexed trunk  unable to fully stand upright; limited lumbar extension  Cervical ROM:  limited cervical extension and rotation  Active AROM (deg) 01/16/24 AROM 02/09/24  Flexion 27 44  Extension 35 38  Right lateral flexion 22 28  Left lateral flexion 12 21  Right rotation 55 55  Left rotation 40 50  (Blank rows = not tested)  STRENGTH: not tested  LOWER EXTREMITY MMT:   MMT Right 01/16/24 Left 01/16/24  Hip flexion 4+ 4+  Hip abduction    Hip adduction    Hip internal rotation    Hip external rotation  Knee flexion    Knee extension 5 5  Ankle dorsiflexion 5 5  Ankle plantarflexion    Ankle inversion    Ankle eversion    (Blank rows = not tested)  BED MOBILITY:  Not tested  TRANSFERS: Assistive device utilized: None  Sit to stand: Modified independence Stand to sit: Modified independence Chair to chair: Modified independence Floor: not tested  GAIT: Gait pattern: decreased arm swing- Right, decreased arm swing- Left, trunk flexed, poor foot clearance- Right, and poor foot clearance- Left Distance walked: 50 ft in clinic Assistive device utilized: None Level of assistance: Modified independence Comments: difficulty with turns; direction  changes  FUNCTIONAL TESTS:  5 times sit to stand: 16.51 sec Dynamic Gait Index: 12/24 SLS 5 right and 5  with light UE support DGI 1. Gait level surface (2) Mild Impairment: Walks 20', uses assistive devices, slower speed, mild gait deviations. 2. Change in gait speed (2) Mild Impairment: Is able to change speed but demonstrates mild gait deviations, or not gait deviations but unable to achieve a significant change in velocity, or uses an assistive device. 3. Gait with horizontal head turns (2) Mild Impairment: Performs head turns smoothly with slight change in gait velocity, i.e., minor disruption to smooth gait path or uses walking aid. 4. Gait with vertical head turns 1) Moderate Impairment: Performs head turns with moderate change in gait velocity, slows down, staggers but recovers, can continue to walk. 5. Gait and pivot turn (1) Moderate Impairment: Turns slowly, requires verbal cueing, requires several small steps to catch balance following turn and stop. 6. Step over obstacle (1) Moderate Impairment: Is able to step over box but must stop, then step over. May require verbal cueing. 7. Step around obstacles (1) Moderate Impairment: Is able to clear cones but must significantly slow, speed to accomplish task, or requires verbal cueing. 8. Stairs (2) Mild Impairment: Alternating feet, must use rail.  TOTAL SCORE: 12 / 24   PATIENT SURVEYS:  ABC scale 49.4%  VESTIBULAR ASSESSMENT:  GENERAL OBSERVATION: glasses, forward flexed trunk   SYMPTOM BEHAVIOR:  Subjective history: see above; glasses  Non-Vestibular symptoms: neck pain  Type of dizziness: Imbalance (Disequilibrium) and Lightheadedness/Faint  Frequency: varies  Duration: varies  Aggravating factors: sitting in choir room then standing up to go upstairs  Relieving factors: rest  Progression of symptoms: unchanged  OCULOMOTOR EXAM:  Ocular Alignment: normal  Ocular ROM: No Limitations  Spontaneous Nystagmus:  absent  Gaze-Induced Nystagmus: absent  Smooth Pursuits: intact  Saccades: intact  Convergence/Divergence: 8 inches   VESTIBULAR - OCULAR REFLEX:   Slow VOR: Comment: not tested  VOR Cancellation: Comment: not tested  Head-Impulse Test: not tested  Dynamic Visual Acuity: not tested   POSITIONAL TESTING: Other: not tested  MOTION SENSITIVITY:  Motion Sensitivity Quotient Intensity: 0 = none, 1 = Lightheaded, 2 = Mild, 3 = Moderate, 4 = Severe, 5 = Vomiting  Intensity  1. Sitting to supine   2. Supine to L side   3. Supine to R side   4. Supine to sitting   5. L Hallpike-Dix   6. Up from L    7. R Hallpike-Dix   8. Up from R    9. Sitting, head tipped to L knee 0  10. Head up from L knee 0  11. Sitting, head tipped to R knee 0  12. Head up from R knee 0  13. Sitting head turns x5 0  14.Sitting head nods x5 0  15. In  stance, 180 turn to L    16. In stance, 180 turn to R     OTHOSTATICS: sitting 152/78; standing 142/76  FUNCTIONAL GAIT: see above                                                                                                                             TREATMENT DATE:  02/27/24 Nustep seat 10 x 5' dynamic warm up Heel raises on incline with 1 UE assist with x 20 reps Toe raises on decline x 20 with 1 UE assist Lumbar extension 2 x 10 over bar (ended treatment with a set) BOSU ball alternating lunges 2 x 10 each Seated thoracic extension 2 x 10 (ended treatment with a set) Green theraband scapular retractions 2 x 10 Green theraband shoulder extensions 2 x 10 Paloff press green theraband 2 x 10 each way    02/25/2024  Therapeutic Exercise: -Treadmill, 5 minutes, 2.0 grade, 1.5 for 3 minutes, 1.9 for last 2 minutes, pt states LEs are worn out -Standing hip extensions, 2 sets of 8 reps, RTB at ankles   Neuromuscular Re-education: -Rocker board, side to side and forward and backward, pt demonstrates increased difficulty and requires BUE support for  slow controlled weight shifts -Lateral stepping, 2 lap, RTB at ankles, in parallel bars, one UE support on occasion -Monster walks, 2 lap, RTB at ankles, in parallel bars, one UE support on occasion -Forward lunges on bosu ball, 1 set of 7 reps bilaterally, BUE support required -Lateral step up and overs, 12 inch step, 1 set of 6 reps, bilaterally, BUE support utilized -Sit to stands, 2 sets of 3 rep, with yellow ball trampoline toss (3 per sts)    02/19/2024  Therapeutic Exercise: -Treadmill, 4 minutes, level 4 resistance, 1.5 for 3 minutes, 1.8 for last minute, pt states Les are worn out -Cervical AROM, flexion/extension and rotations, 1 set of 10 reps  -Speed step ups, 6 inch step up and overs, 2 sets of 30 seconds, 11 reps, 14 reps   Neuromuscular Re-education: -Agility ladder, 11 minutes for motor planning with LE placement, diagonal, 2 step each square horizontal/vertical pattern, 1 lap each variation -Chair to mat transfer, with tidal tank, 1 sets of 10 reps, pt cued for core activation  Manual: -STM to cervical spine and lumbar paraspinals bilaterally, in prone position -grade II, III to cervical spinal segments C3-C7 and lumbar spinal segments, L1-L5  02/13/2024  Therapeutic Exercise: -Nustep, 5 minutes, level 4 resistance, 75 spm -Step ups, 6 inch step up and overs, 1 sets of 5 reps -Lateral step up and overs, 6 inch steps, 1 sets of 5 reps -Standing 3 way hip 1 sets 10 reps, bilaterally, pt cued for upright trunk and maintaining of neutral spine -Lateral stepping 3 laps 20 feet per lap, second 2 with RTB around ankles, pt cued for upright posture -Leg press, 2 sets of 8 reps, plate number 4, pt cued for eccentric control  Neuromuscular Re-education: -Walking marches and butt kicks with no UE support for balance challenge, 40 foot laps, 1 lap each variation, CGA required for safety -Sit to stands, 2 sets of 10 reps, pt cued for core activation -SLS, 3 trial each side in  Parallel bars, multiple corrections made bilaterally, pt cued to use UE correction rather than LE -Cone/hurdle obstacle course with 2 pound ankle weights on 20 foot line, 2 laps, weaving and stepping over for balance and coordination improvement -Aeromat walks, tandem and lateral stepping in parallel bars, 1 lap each variation, pt cued for decreased UE support, uses both UE for most of session    PATIENT EDUCATION: Education details: Patient educated on exam findings, POC, scope of PT, HEP, and what to expect next visit. Person educated: Patient Education method: Explanation, Demonstration, and Handouts Education comprehension: verbalized understanding, returned demonstration, verbal cues required, and tactile cues required  HOME EXERCISE PROGRAM:' 02/04/24 - Standing Lumbar Extension with Counter  - 1 x daily - 7 x weekly - 1 sets - 10 reps 01/28/24 - Heel Raises with Counter Support  - 1 x daily - 7 x weekly - 2 sets - 10 reps - Standing Ankle Plantar Flexion Dorsiflexion with Counter Support  - 1 x daily - 7 x weekly - 2 sets - 10 reps - tandem stance balance; try not to hold on  - 1 x daily - 7 x weekly - 1 sets - 5 reps - 30 sec hold 01/22/24 Upper trap stretch  Access Code: [email or web address] URL: [email or web address] Date: 01/16/2024 Prepared by: [facility]  Exercises - Cervical Extension AROM with Strap  - 2 x daily - 7 x weekly - 1 sets - 10 reps - Seated Scapular Retraction  - 2 x daily - 7 x weekly - 1 sets - 10 reps - standing single leg balance at the counter (try to not hold on)  - 2 x daily - 7 x weekly - 1 sets - 10 reps - 15 sec hold - Seated Thoracic Lumbar Extension  - 2 x daily - 7 x weekly - 1 sets - 10 reps - Corner Pec Major Stretch  - 2 x daily - 7 x weekly - 1 sets - 5 reps - 10 sec hold - Seated Assisted Cervical Rotation with Towel  - 2 x daily - 7 x weekly - 1 sets - 10 reps - 10 sec hold - Seated Shoulder Shrugs  - 2 x daily - 7 x weekly - 2 sets - 10  reps  Access Code: [email or web address] URL: [email or web address] Date: 12/30/2023 Prepared by: [facility]  Exercises - Cervical Extension AROM with Strap  - 2 x daily - 7 x weekly - 1 sets - 10 reps - Seated Scapular Retraction  - 2 x daily - 7 x weekly - 1 sets - 10 reps - standing single leg balance at the counter (try to not hold on)  - 2 x daily - 7 x weekly - 1 sets - 10 reps - 15 sec hold Pencil push ups GOALS: Goals reviewed with patient? No  SHORT TERM GOALS: Target date: 01/30/2024  patient will be independent with initial HEP  Baseline: Goal status: MET  2.  Patient will report 50% improvement overall  Baseline: 25% better Goal status: IN PROGRESS  3.  Patient will be able to stand SLS x 5 without UE support each leg to demonstrate improved functional balance Baseline: 5 each with light UE support Goal status: MET  LONG TERM GOALS: Target date: 02/13/2024  Patient will be independent in self management strategies to improve quality of life and functional outcomes.  Baseline:  Goal status: IN PROGRESS  2.  Patient will report 75% improvement overall  Baseline:  Goal status: IN PROGRESS  3.  Patient will be able to stand SLS x 10 without UE support each leg to demonstrate improved functional balance Baseline: 8 on left and 9 on right 02/09/24 Goal status: IN PROGRESS  4.  Patient will improve DGI score by 5 points to demonstrate improve functional balance and gait Baseline: 12/24; 16/24 02/09/24 Goal status: IN PROGRESS  5.  Patient will remain free of falls Baseline: fell in the yard last week trimming bushes Goal status: IN PROGRESS  ASSESSMENT:  CLINICAL IMPRESSION: Todays session with continued focus on balance and postural strengthening. He reports no dizziness but continues with impaired balance and forward flexed posturing that increase his fall risk.    Needs cues with Paloff press to maintain form and for technique.  Ended with lumbar and  thoracic extensions.   Patient would continue to benefit from skilled physical therapy for decreased giddiness on feet, increased endurance with ambulation, increased LE strength, and improved balance for improved quality of life, improved independence with community ambulation and continued progress towards therapy goals.       Eval:Patient is a 78 y.o. male who was seen today for physical therapy evaluation and treatment for dizziness and giddiness. Patient demonstrates decreased spinal mobility; balance deficits and gait abnormalities which are negatively impacting patient ability to perform ADLs and functional mobility tasks. Patient does not appear to have BPPV.  Patient will benefit from skilled physical therapy services to address these deficits to improve level of function with ADLs, functional mobility tasks, and reduce risk for falls.    OBJECTIVE IMPAIRMENTS: Abnormal gait, decreased activity tolerance, decreased balance, decreased knowledge of condition, decreased mobility, difficulty walking, decreased ROM, and impaired perceived functional ability.   ACTIVITY LIMITATIONS: bending, standing, stairs, transfers, and locomotion level  PARTICIPATION LIMITATIONS: shopping and community activity  REHAB POTENTIAL: Good  CLINICAL DECISION MAKING: Evolving/moderate complexity  EVALUATION COMPLEXITY: Moderate   PLAN:  PT FREQUENCY: 2x/week  PT DURATION: 4 weeks  PLANNED INTERVENTIONS: 97164- PT Re-evaluation, 97110-Therapeutic exercises, 97530- Therapeutic activity, 97112- Neuromuscular re-education, 97535- Self Care, 16109- Manual therapy, [phone]- Gait training, [phone]- Orthotic Fit/training, [phone]- Canalith repositioning, [id number]- Aquatic Therapy, [phone]- Splinting, Patient/Family education, Balance training, Stair training, Taping, Dry Needling, Joint mobilization, Joint manipulation, Spinal manipulation, Spinal mobilization, Scar mobilization, and DME instructions.   PLAN FOR NEXT  SESSION:   Balance; cervical mobility and postural strengthening.  STM to cervical spine as appropriate; extend 2 x a week for 4 week to address remaining unmet and partially met goals.  Work on Editor, commissioning; turns, turning head with walking.    8:47 AM, 02/27/24 [place] Gouge Small [place] Every MPT [place] [facility] [place] [phone]

## 2024-03-03 ENCOUNTER — Encounter (HOSPITAL_COMMUNITY): Payer: Self-pay

## 2024-03-03 ENCOUNTER — Ambulatory Visit (HOSPITAL_COMMUNITY)

## 2024-03-03 DIAGNOSIS — R42 Dizziness and giddiness: Secondary | ICD-10-CM

## 2024-03-03 DIAGNOSIS — R2689 Other abnormalities of gait and mobility: Secondary | ICD-10-CM

## 2024-03-03 NOTE — Therapy (Signed)
 OUTPATIENT PHYSICAL THERAPY VESTIBULAR TREATMENT  Patient Name: ATWELL MCDANEL MRN: [id number] DOB:02-15-1946, 78 y.o., male Today's Date: 03/03/2024  END OF SESSION:  PT End of Session - 03/03/24 1429     Visit Number 13    Number of Visits 16    Date for PT Re-Evaluation 03/12/24    Authorization Type HTA    PT Start Time 1429    PT Stop Time 1510    PT Time Calculation (min) 41 min    Activity Tolerance Patient tolerated treatment well    Behavior During Therapy [facility] for tasks assessed/performed              Past Medical History:  Diagnosis Date   Borderline hyperlipidemia    Chest pain    Dysrhythmia    Exercise-induced tachycardia    Ganglion    left wrist   GERD (gastroesophageal reflux disease)    Thrombocytopenia (HCC)    borderline   Past Surgical History:  Procedure Laterality Date   COLONOSCOPY N/A 04/22/2018   Procedure: COLONOSCOPY;  Surgeon: Shaaron Lamar CHRISTELLA, MD;  Location: [facility];  Service: Endoscopy;  Laterality: N/A;  8:30   GANGLION CYST EXCISION     Left Wrist   KNEE ARTHROSCOPY     Left   TONSILLECTOMY     Patient Active Problem List   Diagnosis Date Noted   RLL pneumonia 03/10/2022   Hypoalbuminemia 03/10/2022   Normocytic anemia 03/10/2022   Atrial flutter (HCC) 09/22/2013   Paroxysmal atrial fibrillation (HCC) 04/23/2013   Rapid palpitations 02/26/2013   FATIGUE 10/24/2009   THROMBOCYTOPENIA 10/11/2009   Chest pain 10/11/2009    PCP: Shona Norleen PEDLAR, MD REFERRING PROVIDER: Shona Norleen PEDLAR, MD  REFERRING DIAG: dizziness and giddiness  THERAPY DIAG:  Dizziness and giddiness  Other abnormalities of gait and mobility  ONSET DATE: 2 months or more  Rationale for Evaluation and Treatment: Rehabilitation  SUBJECTIVE:   SUBJECTIVE STATEMENT: Pt states neck was bothering him pretty bad on Tuesday but says it has not bothered him today. No recent falls reported.  Eval: After sitting a while he gets lightheaded; seems to trip  himself; did have a fall forward about 2 months ago. He thinks he doesn't seem to have any balance issues.  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 No  PRECAUTIONS: Fall    WEIGHT BEARING RESTRICTIONS: No  FALLS: Has patient fallen in last 6 months? Yes. Number of falls 1   PLOF: Independent  PATIENT GOALS: help with my balance  OBJECTIVE:  Note: Objective measures were completed at Evaluation unless otherwise noted.  DIAGNOSTIC FINDINGS: none  COGNITION: Overall cognitive status: Within functional limits for tasks assessed   SENSATION: WFL    POSTURE:  rounded shoulders, forward head, and flexed trunk  unable to fully stand upright; limited lumbar extension  Cervical ROM:  limited cervical extension and rotation  Active AROM (deg) 01/16/24 AROM 02/09/24  Flexion 27 44  Extension 35 38  Right lateral flexion 22 28  Left lateral flexion 12 21  Right rotation 55 55  Left rotation 40 50  (Blank rows = not tested)  STRENGTH: not tested  LOWER EXTREMITY MMT:   MMT Right 01/16/24 Left 01/16/24  Hip flexion 4+ 4+  Hip abduction    Hip adduction    Hip  internal rotation    Hip external rotation    Knee flexion    Knee extension 5 5  Ankle dorsiflexion 5 5  Ankle plantarflexion    Ankle inversion    Ankle eversion    (Blank rows = not tested)  BED MOBILITY:  Not tested  TRANSFERS: Assistive device utilized: None  Sit to stand: Modified independence Stand to sit: Modified independence Chair to chair: Modified independence Floor: not tested  GAIT: Gait pattern: decreased arm swing- Right, decreased arm swing- Left, trunk flexed, poor foot clearance- Right, and poor foot clearance- Left Distance walked: 50 ft in clinic Assistive device utilized: None Level of assistance: Modified independence Comments:  difficulty with turns; direction changes  FUNCTIONAL TESTS:  5 times sit to stand: 16.51 sec Dynamic Gait Index: 12/24 SLS 5 right and 5  with light UE support DGI 1. Gait level surface (2) Mild Impairment: Walks 20', uses assistive devices, slower speed, mild gait deviations. 2. Change in gait speed (2) Mild Impairment: Is able to change speed but demonstrates mild gait deviations, or not gait deviations but unable to achieve a significant change in velocity, or uses an assistive device. 3. Gait with horizontal head turns (2) Mild Impairment: Performs head turns smoothly with slight change in gait velocity, i.e., minor disruption to smooth gait path or uses walking aid. 4. Gait with vertical head turns 1) Moderate Impairment: Performs head turns with moderate change in gait velocity, slows down, staggers but recovers, can continue to walk. 5. Gait and pivot turn (1) Moderate Impairment: Turns slowly, requires verbal cueing, requires several small steps to catch balance following turn and stop. 6. Step over obstacle (1) Moderate Impairment: Is able to step over box but must stop, then step over. May require verbal cueing. 7. Step around obstacles (1) Moderate Impairment: Is able to clear cones but must significantly slow, speed to accomplish task, or requires verbal cueing. 8. Stairs (2) Mild Impairment: Alternating feet, must use rail.  TOTAL SCORE: 12 / 24   PATIENT SURVEYS:  ABC scale 49.4%  VESTIBULAR ASSESSMENT:  GENERAL OBSERVATION: glasses, forward flexed trunk   SYMPTOM BEHAVIOR:  Subjective history: see above; glasses  Non-Vestibular symptoms: neck pain  Type of dizziness: Imbalance (Disequilibrium) and Lightheadedness/Faint  Frequency: varies  Duration: varies  Aggravating factors: sitting in choir room then standing up to go upstairs  Relieving factors: rest  Progression of symptoms: unchanged  OCULOMOTOR EXAM:  Ocular Alignment: normal  Ocular ROM: No  Limitations  Spontaneous Nystagmus: absent  Gaze-Induced Nystagmus: absent  Smooth Pursuits: intact  Saccades: intact  Convergence/Divergence: 8 inches   VESTIBULAR - OCULAR REFLEX:   Slow VOR: Comment: not tested  VOR Cancellation: Comment: not tested  Head-Impulse Test: not tested  Dynamic Visual Acuity: not tested   POSITIONAL TESTING: Other: not tested  MOTION SENSITIVITY:  Motion Sensitivity Quotient Intensity: 0 = none, 1 = Lightheaded, 2 = Mild, 3 = Moderate, 4 = Severe, 5 = Vomiting  Intensity  1. Sitting to supine   2. Supine to L side   3. Supine to R side   4. Supine to sitting   5. L Hallpike-Dix   6. Up from L    7. R Hallpike-Dix   8. Up from R    9. Sitting, head tipped to L knee 0  10. Head up from L knee 0  11. Sitting, head tipped to R knee 0  12. Head up from R knee 0  13. Sitting head turns  x5 0  14.Sitting head nods x5 0  15. In stance, 180 turn to L    16. In stance, 180 turn to R     OTHOSTATICS: sitting 152/78; standing 142/76  FUNCTIONAL GAIT: see above                                                                                                                             TREATMENT DATE:  03/03/2024  BP: 111/62 mmHg, 114/68 mmHg Therapeutic Exercise: -Treadmill, 5 minutes, 2.0 grade, 1.0 for 3 minutes, 1.4 for last 2 minutes, pt requires less verbal cueing today for picking up feet and standing up straight   Neuromuscular Re-education: -Green theraband scapular retractions 2 x 10, standing on blue foam -Green theraband shoulder extensions 2 x 10, standing on blue foam -Lateral stepping with 4lb cuff weights, 1 lap 32 feet, pt requires rest break due to leg fatigue and light headed feeling  -Walking marches, with 4lb cuff weights, 1 lap, 40 feet -Sit to stands, 2 sets of 4 rep, with blue ball trampoline toss (3 per sts), one foot on blue foam    02/27/24 Nustep seat 10 x 5' dynamic warm up Heel raises on incline with 1 UE assist  with x 20 reps Toe raises on decline x 20 with 1 UE assist Lumbar extension 2 x 10 over bar (ended treatment with a set) BOSU ball alternating lunges 2 x 10 each Seated thoracic extension 2 x 10 (ended treatment with a set) Green theraband scapular retractions 2 x 10 Green theraband shoulder extensions 2 x 10 Paloff press green theraband 2 x 10 each way    02/25/2024  Therapeutic Exercise: -Treadmill, 5 minutes, 2.0 grade, 1.5 for 3 minutes, 1.9 for last 2 minutes, pt states LEs are worn out -Standing hip extensions, 2 sets of 8 reps, RTB at ankles   Neuromuscular Re-education: -Rocker board, side to side and forward and backward, pt demonstrates increased difficulty and requires BUE support for slow controlled weight shifts -Lateral stepping, 2 lap, RTB at ankles, in parallel bars, one UE support on occasion -Monster walks, 2 lap, RTB at ankles, in parallel bars, one UE support on occasion -Forward lunges on bosu ball, 1 set of 7 reps bilaterally, BUE support required -Lateral step up and overs, 12 inch step, 1 set of 6 reps, bilaterally, BUE support utilized -Sit to stands, 2 sets of 3 rep, with yellow ball trampoline toss (3 per sts)   PATIENT EDUCATION: Education details: Patient educated on exam findings, POC, scope of PT, HEP, and what to expect next visit. Person educated: Patient Education method: Explanation, Demonstration, and Handouts Education comprehension: verbalized understanding, returned demonstration, verbal cues required, and tactile cues required  HOME EXERCISE PROGRAM:' 02/04/24 - Standing Lumbar Extension with Counter  - 1 x daily - 7 x weekly - 1 sets - 10 reps 01/28/24 - Heel Raises with Counter Support  - 1 x daily - 7 x weekly -  2 sets - 10 reps - Standing Ankle Plantar Flexion Dorsiflexion with Counter Support  - 1 x daily - 7 x weekly - 2 sets - 10 reps - tandem stance balance; try not to hold on  - 1 x daily - 7 x weekly - 1 sets - 5 reps - 30 sec  hold 01/22/24 Upper trap stretch  Access Code: [email or web address] URL: [email or web address] Date: 01/16/2024 Prepared by: [facility] - [facility]  Exercises - Cervical Extension AROM with Strap  - 2 x daily - 7 x weekly - 1 sets - 10 reps - Seated Scapular Retraction  - 2 x daily - 7 x weekly - 1 sets - 10 reps - standing single leg balance at the counter (try to not hold on)  - 2 x daily - 7 x weekly - 1 sets - 10 reps - 15 sec hold - Seated Thoracic Lumbar Extension  - 2 x daily - 7 x weekly - 1 sets - 10 reps - Corner Pec Major Stretch  - 2 x daily - 7 x weekly - 1 sets - 5 reps - 10 sec hold - Seated Assisted Cervical Rotation with Towel  - 2 x daily - 7 x weekly - 1 sets - 10 reps - 10 sec hold - Seated Shoulder Shrugs  - 2 x daily - 7 x weekly - 2 sets - 10 reps  Access Code: [email or web address] URL: [email or web address] Date: 12/30/2023 Prepared by: [facility]  Exercises - Cervical Extension AROM with Strap  - 2 x daily - 7 x weekly - 1 sets - 10 reps - Seated Scapular Retraction  - 2 x daily - 7 x weekly - 1 sets - 10 reps - standing single leg balance at the counter (try to not hold on)  - 2 x daily - 7 x weekly - 1 sets - 10 reps - 15 sec hold Pencil push ups GOALS: Goals reviewed with patient? No  SHORT TERM GOALS: Target date: 01/30/2024  patient will be independent with initial HEP  Baseline: Goal status: MET  2.  Patient will report 50% improvement overall  Baseline: 25% better Goal status: IN PROGRESS  3.  Patient will be able to stand SLS x 5 without UE support each leg to demonstrate improved functional balance Baseline: 5 each with light UE support Goal status: MET   LONG TERM GOALS: Target date: 02/13/2024  Patient will be independent in self management strategies to improve quality of life and functional outcomes.  Baseline:  Goal status: IN PROGRESS  2.  Patient will report 75% improvement overall  Baseline:  Goal status: IN PROGRESS  3.   Patient will be able to stand SLS x 10 without UE support each leg to demonstrate improved functional balance Baseline: 8 on left and 9 on right 02/09/24 Goal status: IN PROGRESS  4.  Patient will improve DGI score by 5 points to demonstrate improve functional balance and gait Baseline: 12/24; 16/24 02/09/24 Goal status: IN PROGRESS  5.  Patient will remain free of falls Baseline: fell in the yard last week trimming bushes Goal status: IN PROGRESS  ASSESSMENT:  CLINICAL IMPRESSION: Patient continues to demonstrate decreased LE strength/endurance, decreased gait quality and significant balance issues. Patient also demonstrates decreased endurance with increased intensity LE strengthening exercise during today's session. Patient able to continue dynamic balance and core activation exercises today with treadmill training and ankle weight walks, good performance with verbal cueing. Patient would continue to benefit from skilled physical therapy  for increased endurance with ambulation, increased LE strength, and improved balance for improved quality of life, improved independence with gait training and continued progress towards therapy goals.        Eval:Patient is a 78 y.o. male who was seen today for physical therapy evaluation and treatment for dizziness and giddiness. Patient demonstrates decreased spinal mobility; balance deficits and gait abnormalities which are negatively impacting patient ability to perform ADLs and functional mobility tasks. Patient does not appear to have BPPV.  Patient will benefit from skilled physical therapy services to address these deficits to improve level of function with ADLs, functional mobility tasks, and reduce risk for falls.    OBJECTIVE IMPAIRMENTS: Abnormal gait, decreased activity tolerance, decreased balance, decreased knowledge of condition, decreased mobility, difficulty walking, decreased ROM, and impaired perceived functional ability.   ACTIVITY  LIMITATIONS: bending, standing, stairs, transfers, and locomotion level  PARTICIPATION LIMITATIONS: shopping and community activity  REHAB POTENTIAL: Good  CLINICAL DECISION MAKING: Evolving/moderate complexity  EVALUATION COMPLEXITY: Moderate   PLAN:  PT FREQUENCY: 2x/week  PT DURATION: 4 weeks  PLANNED INTERVENTIONS: 97164- PT Re-evaluation, 97110-Therapeutic exercises, 97530- Therapeutic activity, 97112- Neuromuscular re-education, 97535- Self Care, [id number]- Manual therapy, [phone]- Gait training, [phone]- Orthotic Fit/training, [phone]- Canalith repositioning, [id number]- Aquatic Therapy, [phone]- Splinting, Patient/Family education, Balance training, Stair training, Taping, Dry Needling, Joint mobilization, Joint manipulation, Spinal manipulation, Spinal mobilization, Scar mobilization, and DME instructions.   PLAN FOR NEXT SESSION:   Balance; cervical mobility and postural strengthening.  STM to cervical spine as appropriate; extend 2 x a week for 4 week to address remaining unmet and partially met goals.  Work on Editor, commissioning; turns, turning head with walking.    Steen Bisig, PT, DPT [facility] Office: [phone] 3:21 PM, 03/03/24

## 2024-03-05 ENCOUNTER — Encounter (HOSPITAL_COMMUNITY): Payer: Self-pay

## 2024-03-05 ENCOUNTER — Encounter (HOSPITAL_COMMUNITY)

## 2024-03-05 ENCOUNTER — Ambulatory Visit (HOSPITAL_COMMUNITY)

## 2024-03-05 DIAGNOSIS — R42 Dizziness and giddiness: Secondary | ICD-10-CM | POA: Diagnosis not present

## 2024-03-05 DIAGNOSIS — R2689 Other abnormalities of gait and mobility: Secondary | ICD-10-CM

## 2024-03-05 NOTE — Therapy (Signed)
 OUTPATIENT PHYSICAL THERAPY VESTIBULAR TREATMENT  Patient Name: Greg Mann MRN: [id number] DOB:07-27-1946, 78 y.o., male Today's Date: 03/05/2024  END OF SESSION:  PT End of Session - 03/05/24 0844     Visit Number 14    Number of Visits 16    Date for PT Re-Evaluation 03/12/24    Authorization Type HTA    PT Start Time 0844    PT Stop Time 0924    PT Time Calculation (min) 40 min    Activity Tolerance Patient tolerated treatment well    Behavior During Therapy [facility] for tasks assessed/performed               Past Medical History:  Diagnosis Date   Borderline hyperlipidemia    Chest pain    Dysrhythmia    Exercise-induced tachycardia    Ganglion    left wrist   GERD (gastroesophageal reflux disease)    Thrombocytopenia (HCC)    borderline   Past Surgical History:  Procedure Laterality Date   COLONOSCOPY N/A 04/22/2018   Procedure: COLONOSCOPY;  Surgeon: Shaaron Lamar CHRISTELLA, MD;  Location: [facility];  Service: Endoscopy;  Laterality: N/A;  8:30   GANGLION CYST EXCISION     Left Wrist   KNEE ARTHROSCOPY     Left   TONSILLECTOMY     Patient Active Problem List   Diagnosis Date Noted   RLL pneumonia 03/10/2022   Hypoalbuminemia 03/10/2022   Normocytic anemia 03/10/2022   Atrial flutter (HCC) 09/22/2013   Paroxysmal atrial fibrillation (HCC) 04/23/2013   Rapid palpitations 02/26/2013   FATIGUE 10/24/2009   THROMBOCYTOPENIA 10/11/2009   Chest pain 10/11/2009    PCP: Shona Norleen PEDLAR, MD REFERRING PROVIDER: Shona Norleen PEDLAR, MD  REFERRING DIAG: dizziness and giddiness  THERAPY DIAG:  Dizziness and giddiness  Other abnormalities of gait and mobility  ONSET DATE: 2 months or more  Rationale for Evaluation and Treatment: Rehabilitation  SUBJECTIVE:   SUBJECTIVE STATEMENT: Pt states going to [employer] yesterday and can tell his low back was a little sore after that. Pt states he is dealing with some sinus issues causing some crud around his vocal  chords.  Eval: After sitting a while he gets lightheaded; seems to trip himself; did have a fall forward about 2 months ago. He thinks he doesn't seem to have any balance issues.  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 No  PRECAUTIONS: Fall    WEIGHT BEARING RESTRICTIONS: No  FALLS: Has patient fallen in last 6 months? Yes. Number of falls 1   PLOF: Independent  PATIENT GOALS: help with my balance  OBJECTIVE:  Note: Objective measures were completed at Evaluation unless otherwise noted.  DIAGNOSTIC FINDINGS: none  COGNITION: Overall cognitive status: Within functional limits for tasks assessed   SENSATION: WFL    POSTURE:  rounded shoulders, forward head, and flexed trunk  unable to fully stand upright; limited lumbar extension  Cervical ROM:  limited cervical extension and rotation  Active AROM (deg) 01/16/24 AROM 02/09/24  Flexion 27 44  Extension 35 38  Right lateral flexion 22 28  Left lateral flexion 12 21  Right rotation 55 55  Left rotation 40 50  (Blank rows = not tested)  STRENGTH: not tested  LOWER EXTREMITY MMT:   MMT Right 01/16/24 Left 01/16/24  Hip flexion 4+  4+  Hip abduction    Hip adduction    Hip internal rotation    Hip external rotation    Knee flexion    Knee extension 5 5  Ankle dorsiflexion 5 5  Ankle plantarflexion    Ankle inversion    Ankle eversion    (Blank rows = not tested)  BED MOBILITY:  Not tested  TRANSFERS: Assistive device utilized: None  Sit to stand: Modified independence Stand to sit: Modified independence Chair to chair: Modified independence Floor: not tested  GAIT: Gait pattern: decreased arm swing- Right, decreased arm swing- Left, trunk flexed, poor foot clearance- Right, and poor foot clearance- Left Distance walked: 50 ft in clinic Assistive device  utilized: None Level of assistance: Modified independence Comments: difficulty with turns; direction changes  FUNCTIONAL TESTS:  5 times sit to stand: 16.51 sec Dynamic Gait Index: 12/24 SLS 5 right and 5  with light UE support DGI 1. Gait level surface (2) Mild Impairment: Walks 20', uses assistive devices, slower speed, mild gait deviations. 2. Change in gait speed (2) Mild Impairment: Is able to change speed but demonstrates mild gait deviations, or not gait deviations but unable to achieve a significant change in velocity, or uses an assistive device. 3. Gait with horizontal head turns (2) Mild Impairment: Performs head turns smoothly with slight change in gait velocity, i.e., minor disruption to smooth gait path or uses walking aid. 4. Gait with vertical head turns 1) Moderate Impairment: Performs head turns with moderate change in gait velocity, slows down, staggers but recovers, can continue to walk. 5. Gait and pivot turn (1) Moderate Impairment: Turns slowly, requires verbal cueing, requires several small steps to catch balance following turn and stop. 6. Step over obstacle (1) Moderate Impairment: Is able to step over box but must stop, then step over. May require verbal cueing. 7. Step around obstacles (1) Moderate Impairment: Is able to clear cones but must significantly slow, speed to accomplish task, or requires verbal cueing. 8. Stairs (2) Mild Impairment: Alternating feet, must use rail.  TOTAL SCORE: 12 / 24   PATIENT SURVEYS:  ABC scale 49.4%  VESTIBULAR ASSESSMENT:  GENERAL OBSERVATION: glasses, forward flexed trunk   SYMPTOM BEHAVIOR:  Subjective history: see above; glasses  Non-Vestibular symptoms: neck pain  Type of dizziness: Imbalance (Disequilibrium) and Lightheadedness/Faint  Frequency: varies  Duration: varies  Aggravating factors: sitting in choir room then standing up to go upstairs  Relieving factors: rest  Progression of symptoms:  unchanged  OCULOMOTOR EXAM:  Ocular Alignment: normal  Ocular ROM: No Limitations  Spontaneous Nystagmus: absent  Gaze-Induced Nystagmus: absent  Smooth Pursuits: intact  Saccades: intact  Convergence/Divergence: 8 inches   VESTIBULAR - OCULAR REFLEX:   Slow VOR: Comment: not tested  VOR Cancellation: Comment: not tested  Head-Impulse Test: not tested  Dynamic Visual Acuity: not tested   POSITIONAL TESTING: Other: not tested  MOTION SENSITIVITY:  Motion Sensitivity Quotient Intensity: 0 = none, 1 = Lightheaded, 2 = Mild, 3 = Moderate, 4 = Severe, 5 = Vomiting  Intensity  1. Sitting to supine   2. Supine to L side   3. Supine to R side   4. Supine to sitting   5. L Hallpike-Dix   6. Up from L    7. R Hallpike-Dix   8. Up from R    9. Sitting, head tipped to L knee 0  10. Head up from L knee 0  11. Sitting, head tipped to R knee 0  12. Head up from R knee 0  13. Sitting head turns x5 0  14.Sitting head nods x5 0  15. In stance, 180 turn to L    16. In stance, 180 turn to R     OTHOSTATICS: sitting 152/78; standing 142/76  FUNCTIONAL GAIT: see above                                                                                                                             TREATMENT DATE:  03/05/2024   BP: 151/79 mmHg, 139/80 Therapeutic Exercise: -Elliptical machine, 5 minutes, pt requires cuing for UE usage for full rotations on machine  Neuromuscular Re-education: -Green theraband lateral stepping, 2 laps, 30 foot laps -Green theraband monster walks, 2 laps, 30 foot laps -Obstacle course for motor planning and multitasking for increased balance challenge -9 inch hurdles, 9 cones, 2 balance pads, all while holding red weighted ball on top of cone, pt requires CGA  03/03/2024  BP: 111/62 mmHg, 114/68 mmHg Therapeutic Exercise: -Treadmill, 5 minutes, 2.0 grade, 1.0 for 3 minutes, 1.4 for last 2 minutes, pt requires less verbal cueing today for picking up feet  and standing up straight   Neuromuscular Re-education: -Green theraband scapular retractions 2 x 10, standing on blue foam -Green theraband shoulder extensions 2 x 10, standing on blue foam -Lateral stepping with 4lb cuff weights, 1 lap 32 feet, pt requires rest break due to leg fatigue and light headed feeling  -Walking marches, with 4lb cuff weights, 1 lap, 40 feet -Sit to stands, 2 sets of 4 rep, with blue ball trampoline toss (3 per sts), one foot on blue foam    02/27/24 Nustep seat 10 x 5' dynamic warm up Heel raises on incline with 1 UE assist with x 20 reps Toe raises on decline x 20 with 1 UE assist Lumbar extension 2 x 10 over bar (ended treatment with a set) BOSU ball alternating lunges 2 x 10 each Seated thoracic extension 2 x 10 (ended treatment with a set) Green theraband scapular retractions 2 x 10 Green theraband shoulder extensions 2 x 10 Paloff press green theraband 2 x 10 each way    02/25/2024  Therapeutic Exercise: -Treadmill, 5 minutes, 2.0 grade, 1.5 for 3 minutes, 1.9 for last 2 minutes, pt states LEs are worn out -Standing hip extensions, 2 sets of 8 reps, RTB at ankles   Neuromuscular Re-education: -Rocker board, side to side and forward and backward, pt demonstrates increased difficulty and requires BUE support for slow controlled weight shifts -Lateral stepping, 2 lap, RTB at ankles, in parallel bars, one UE support on occasion -Monster walks, 2 lap, RTB at ankles, in parallel bars, one UE support on occasion -Forward lunges on bosu ball, 1 set of 7 reps bilaterally, BUE support required -Lateral step up and overs, 12 inch step, 1 set of 6 reps, bilaterally, BUE support utilized -Sit to stands, 2 sets of 3 rep, with yellow ball trampoline  toss (3 per sts)   PATIENT EDUCATION: Education details: Patient educated on exam findings, POC, scope of PT, HEP, and what to expect next visit. Person educated: Patient Education method: Explanation,  Demonstration, and Handouts Education comprehension: verbalized understanding, returned demonstration, verbal cues required, and tactile cues required  HOME EXERCISE PROGRAM:' 02/04/24 - Standing Lumbar Extension with Counter  - 1 x daily - 7 x weekly - 1 sets - 10 reps 01/28/24 - Heel Raises with Counter Support  - 1 x daily - 7 x weekly - 2 sets - 10 reps - Standing Ankle Plantar Flexion Dorsiflexion with Counter Support  - 1 x daily - 7 x weekly - 2 sets - 10 reps - tandem stance balance; try not to hold on  - 1 x daily - 7 x weekly - 1 sets - 5 reps - 30 sec hold 01/22/24 Upper trap stretch  Access Code: [email or web address] URL: [email or web address] Date: 01/16/2024 Prepared by: [facility]  Exercises - Cervical Extension AROM with Strap  - 2 x daily - 7 x weekly - 1 sets - 10 reps - Seated Scapular Retraction  - 2 x daily - 7 x weekly - 1 sets - 10 reps - standing single leg balance at the counter (try to not hold on)  - 2 x daily - 7 x weekly - 1 sets - 10 reps - 15 sec hold - Seated Thoracic Lumbar Extension  - 2 x daily - 7 x weekly - 1 sets - 10 reps - Corner Pec Major Stretch  - 2 x daily - 7 x weekly - 1 sets - 5 reps - 10 sec hold - Seated Assisted Cervical Rotation with Towel  - 2 x daily - 7 x weekly - 1 sets - 10 reps - 10 sec hold - Seated Shoulder Shrugs  - 2 x daily - 7 x weekly - 2 sets - 10 reps  Access Code: [email or web address] URL: [email or web address] Date: 12/30/2023 Prepared by: [facility]  Exercises - Cervical Extension AROM with Strap  - 2 x daily - 7 x weekly - 1 sets - 10 reps - Seated Scapular Retraction  - 2 x daily - 7 x weekly - 1 sets - 10 reps - standing single leg balance at the counter (try to not hold on)  - 2 x daily - 7 x weekly - 1 sets - 10 reps - 15 sec hold Pencil push ups GOALS: Goals reviewed with patient? No  SHORT TERM GOALS: Target date: 01/30/2024  patient will be independent with initial HEP  Baseline: Goal status:  MET  2.  Patient will report 50% improvement overall  Baseline: 25% better Goal status: IN PROGRESS  3.  Patient will be able to stand SLS x 5 without UE support each leg to demonstrate improved functional balance Baseline: 5 each with light UE support Goal status: MET   LONG TERM GOALS: Target date: 02/13/2024  Patient will be independent in self management strategies to improve quality of life and functional outcomes.  Baseline:  Goal status: IN PROGRESS  2.  Patient will report 75% improvement overall  Baseline:  Goal status: IN PROGRESS  3.  Patient will be able to stand SLS x 10 without UE support each leg to demonstrate improved functional balance Baseline: 8 on left and 9 on right 02/09/24 Goal status: IN PROGRESS  4.  Patient will improve DGI score by 5 points to demonstrate improve functional balance and gait Baseline: 12/24; 16/24 02/09/24  Goal status: IN PROGRESS  5.  Patient will remain free of falls Baseline: fell in the yard last week trimming bushes Goal status: IN PROGRESS  ASSESSMENT:  CLINICAL IMPRESSION: Patient continues to demonstrate decreased LE strength, decreased gait quality and impaired balance. Patient also demonstrates decreased tolerance with elliptical machine with physical help from therapist for full rotations during today's session. Patient able to progress dynamic balance and core activation exercises today with obstacle course today with decreased motor performance noted with multitasking and balance techniques, verbal cueing required to retain proper form. Patient would continue to benefit from skilled physical therapy for increased endurance with ambulation, increased LE strength, and improved balance for improved quality of life, improved community ambulation and continued progress towards therapy goals.    Eval:Patient is a 78 y.o. male who was seen today for physical therapy evaluation and treatment for dizziness and giddiness.  Patient demonstrates decreased spinal mobility; balance deficits and gait abnormalities which are negatively impacting patient ability to perform ADLs and functional mobility tasks. Patient does not appear to have BPPV.  Patient will benefit from skilled physical therapy services to address these deficits to improve level of function with ADLs, functional mobility tasks, and reduce risk for falls.    OBJECTIVE IMPAIRMENTS: Abnormal gait, decreased activity tolerance, decreased balance, decreased knowledge of condition, decreased mobility, difficulty walking, decreased ROM, and impaired perceived functional ability.   ACTIVITY LIMITATIONS: bending, standing, stairs, transfers, and locomotion level  PARTICIPATION LIMITATIONS: shopping and community activity  REHAB POTENTIAL: Good  CLINICAL DECISION MAKING: Evolving/moderate complexity  EVALUATION COMPLEXITY: Moderate   PLAN:  PT FREQUENCY: 2x/week  PT DURATION: 4 weeks  PLANNED INTERVENTIONS: 97164- PT Re-evaluation, 97110-Therapeutic exercises, 97530- Therapeutic activity, 97112- Neuromuscular re-education, 97535- Self Care, [id number]- Manual therapy, [phone]- Gait training, [phone]- Orthotic Fit/training, [phone]- Canalith repositioning, [id number]- Aquatic Therapy, [phone]- Splinting, Patient/Family education, Balance training, Stair training, Taping, Dry Needling, Joint mobilization, Joint manipulation, Spinal manipulation, Spinal mobilization, Scar mobilization, and DME instructions.   PLAN FOR NEXT SESSION:   Balance; cervical mobility and postural strengthening.  STM to cervical spine as appropriate; extend 2 x a week for 4 week to address remaining unmet and partially met goals.  Work on Editor, commissioning; turns, turning head with walking.    Lang Ada, PT, DPT [facility] Office: [phone] 9:34 AM, 03/05/24

## 2024-03-07 ENCOUNTER — Other Ambulatory Visit: Payer: Self-pay | Admitting: Internal Medicine

## 2024-03-07 DIAGNOSIS — I480 Paroxysmal atrial fibrillation: Secondary | ICD-10-CM

## 2024-03-08 NOTE — Telephone Encounter (Signed)
 Prescription refill request for Eliquis  received. Indication:afib Last office visit:11/24 Scr:0.99  3/25 Age: 78 Weight:76.2  kg  Prescription refilled

## 2024-03-09 ENCOUNTER — Ambulatory Visit (HOSPITAL_COMMUNITY): Attending: Internal Medicine

## 2024-03-09 DIAGNOSIS — R42 Dizziness and giddiness: Secondary | ICD-10-CM | POA: Insufficient documentation

## 2024-03-09 DIAGNOSIS — R2689 Other abnormalities of gait and mobility: Secondary | ICD-10-CM | POA: Diagnosis not present

## 2024-03-09 NOTE — Therapy (Signed)
 OUTPATIENT PHYSICAL THERAPY VESTIBULAR TREATMENT  Patient Name: Greg Mann MRN: [id number] DOB:Jun 27, 1946, 78 y.o., male Today's Date: 03/09/2024  END OF SESSION:  PT End of Session - 03/09/24 1148     Visit Number 15    Number of Visits 16    Date for PT Re-Evaluation 03/12/24    Authorization Type HTA    PT Start Time 1146    PT Stop Time 1226    PT Time Calculation (min) 40 min    Activity Tolerance Patient tolerated treatment well    Behavior During Therapy WFL for tasks assessed/performed                Past Medical History:  Diagnosis Date   Borderline hyperlipidemia    Chest pain    Dysrhythmia    Exercise-induced tachycardia    Ganglion    left wrist   GERD (gastroesophageal reflux disease)    Thrombocytopenia (HCC)    borderline   Past Surgical History:  Procedure Laterality Date   COLONOSCOPY N/A 04/22/2018   Procedure: COLONOSCOPY;  Surgeon: Shaaron Lamar CHRISTELLA, MD;  Location: [facility];  Service: Endoscopy;  Laterality: N/A;  8:30   GANGLION CYST EXCISION     Left Wrist   KNEE ARTHROSCOPY     Left   TONSILLECTOMY     Patient Active Problem List   Diagnosis Date Noted   RLL pneumonia 03/10/2022   Hypoalbuminemia 03/10/2022   Normocytic anemia 03/10/2022   Atrial flutter (HCC) 09/22/2013   Paroxysmal atrial fibrillation (HCC) 04/23/2013   Rapid palpitations 02/26/2013   FATIGUE 10/24/2009   THROMBOCYTOPENIA 10/11/2009   Chest pain 10/11/2009    PCP: Shona Norleen PEDLAR, MD REFERRING PROVIDER: Shona Norleen PEDLAR, MD  REFERRING DIAG: dizziness and giddiness  THERAPY DIAG:  Dizziness and giddiness  Other abnormalities of gait and mobility  ONSET DATE: 2 months or more  Rationale for Evaluation and Treatment: Rehabilitation  SUBJECTIVE:   SUBJECTIVE STATEMENT: Still gets a little off balance if he turns or changes direction too quickly.    Eval: After sitting a while he gets lightheaded; seems to trip himself; did have a fall forward  about 2 months ago. He thinks he doesn't seem to have any balance issues.  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 No  PRECAUTIONS: Fall    WEIGHT BEARING RESTRICTIONS: No  FALLS: Has patient fallen in last 6 months? Yes. Number of falls 1   PLOF: Independent  PATIENT GOALS: help with my balance  OBJECTIVE:  Note: Objective measures were completed at Evaluation unless otherwise noted.  DIAGNOSTIC FINDINGS: none  COGNITION: Overall cognitive status: Within functional limits for tasks assessed   SENSATION: WFL    POSTURE:  rounded shoulders, forward head, and flexed trunk  unable to fully stand upright; limited lumbar extension  Cervical ROM:  limited cervical extension and rotation  Active AROM (deg) 01/16/24 AROM 02/09/24  Flexion 27 44  Extension 35 38  Right lateral flexion 22 28  Left lateral flexion 12 21  Right rotation 55 55  Left rotation 40 50  (Blank rows = not tested)  STRENGTH: not tested  LOWER EXTREMITY MMT:   MMT Right 01/16/24 Left 01/16/24  Hip flexion 4+ 4+  Hip abduction    Hip adduction    Hip internal rotation  Hip external rotation    Knee flexion    Knee extension 5 5  Ankle dorsiflexion 5 5  Ankle plantarflexion    Ankle inversion    Ankle eversion    (Blank rows = not tested)  BED MOBILITY:  Not tested  TRANSFERS: Assistive device utilized: None  Sit to stand: Modified independence Stand to sit: Modified independence Chair to chair: Modified independence Floor: not tested  GAIT: Gait pattern: decreased arm swing- Right, decreased arm swing- Left, trunk flexed, poor foot clearance- Right, and poor foot clearance- Left Distance walked: 50 ft in clinic Assistive device utilized: None Level of assistance: Modified independence Comments: difficulty with turns; direction  changes  FUNCTIONAL TESTS:  5 times sit to stand: 16.51 sec Dynamic Gait Index: 12/24 SLS 5 right and 5  with light UE support DGI 1. Gait level surface (2) Mild Impairment: Walks 20', uses assistive devices, slower speed, mild gait deviations. 2. Change in gait speed (2) Mild Impairment: Is able to change speed but demonstrates mild gait deviations, or not gait deviations but unable to achieve a significant change in velocity, or uses an assistive device. 3. Gait with horizontal head turns (2) Mild Impairment: Performs head turns smoothly with slight change in gait velocity, i.e., minor disruption to smooth gait path or uses walking aid. 4. Gait with vertical head turns 1) Moderate Impairment: Performs head turns with moderate change in gait velocity, slows down, staggers but recovers, can continue to walk. 5. Gait and pivot turn (1) Moderate Impairment: Turns slowly, requires verbal cueing, requires several small steps to catch balance following turn and stop. 6. Step over obstacle (1) Moderate Impairment: Is able to step over box but must stop, then step over. May require verbal cueing. 7. Step around obstacles (1) Moderate Impairment: Is able to clear cones but must significantly slow, speed to accomplish task, or requires verbal cueing. 8. Stairs (2) Mild Impairment: Alternating feet, must use rail.  TOTAL SCORE: 12 / 24   PATIENT SURVEYS:  ABC scale 49.4%  VESTIBULAR ASSESSMENT:  GENERAL OBSERVATION: glasses, forward flexed trunk   SYMPTOM BEHAVIOR:  Subjective history: see above; glasses  Non-Vestibular symptoms: neck pain  Type of dizziness: Imbalance (Disequilibrium) and Lightheadedness/Faint  Frequency: varies  Duration: varies  Aggravating factors: sitting in choir room then standing up to go upstairs  Relieving factors: rest  Progression of symptoms: unchanged  OCULOMOTOR EXAM:  Ocular Alignment: normal  Ocular ROM: No Limitations  Spontaneous Nystagmus:  absent  Gaze-Induced Nystagmus: absent  Smooth Pursuits: intact  Saccades: intact  Convergence/Divergence: 8 inches   VESTIBULAR - OCULAR REFLEX:   Slow VOR: Comment: not tested  VOR Cancellation: Comment: not tested  Head-Impulse Test: not tested  Dynamic Visual Acuity: not tested   POSITIONAL TESTING: Other: not tested  MOTION SENSITIVITY:  Motion Sensitivity Quotient Intensity: 0 = none, 1 = Lightheaded, 2 = Mild, 3 = Moderate, 4 = Severe, 5 = Vomiting  Intensity  1. Sitting to supine   2. Supine to L side   3. Supine to R side   4. Supine to sitting   5. L Hallpike-Dix   6. Up from L    7. R Hallpike-Dix   8. Up from R    9. Sitting, head tipped to L knee 0  10. Head up from L knee 0  11. Sitting, head tipped to R knee 0  12. Head up from R knee 0  13. Sitting head turns x5 0  14.Sitting head  nods x5 0  15. In stance, 180 turn to L    16. In stance, 180 turn to R     OTHOSTATICS: sitting 152/78; standing 142/76  FUNCTIONAL GAIT: see above                                                                                                                             TREATMENT DATE:  03/09/24 Nustep seat 10 x 5' dynamic warm up Standing lumbar extension x 10 Seated thoracic extension x 10 Obstacle course 1 tall hurdle; 3 short hurdles; 2 yoga blocks and 3 cones while holding red med ball on cone with CGA 180 degree turns in // bars x 10 Standing on foam beam stacking cones Standing on foam beam tandem stance stacking cones Standing on foam beam rhythmic stabilization 3 x 15 Tandem walking 20 ft down and back with CCG   03/05/2024   BP: 151/79 mmHg, 139/80 Therapeutic Exercise: -Elliptical machine, 5 minutes, pt requires cuing for UE usage for full rotations on machine  Neuromuscular Re-education: -Green theraband lateral stepping, 2 laps, 30 foot laps -Green theraband monster walks, 2 laps, 30 foot laps -Obstacle course for motor planning and multitasking  for increased balance challenge -9 inch hurdles, 9 cones, 2 balance pads, all while holding red weighted ball on top of cone, pt requires CGA  03/03/2024  BP: 111/62 mmHg, 114/68 mmHg Therapeutic Exercise: -Treadmill, 5 minutes, 2.0 grade, 1.0 for 3 minutes, 1.4 for last 2 minutes, pt requires less verbal cueing today for picking up feet and standing up straight   Neuromuscular Re-education: -Green theraband scapular retractions 2 x 10, standing on blue foam -Green theraband shoulder extensions 2 x 10, standing on blue foam -Lateral stepping with 4lb cuff weights, 1 lap 32 feet, pt requires rest break due to leg fatigue and light headed feeling  -Walking marches, with 4lb cuff weights, 1 lap, 40 feet -Sit to stands, 2 sets of 4 rep, with blue ball trampoline toss (3 per sts), one foot on blue foam    02/27/24 Nustep seat 10 x 5' dynamic warm up Heel raises on incline with 1 UE assist with x 20 reps Toe raises on decline x 20 with 1 UE assist Lumbar extension 2 x 10 over bar (ended treatment with a set) BOSU ball alternating lunges 2 x 10 each Seated thoracic extension 2 x 10 (ended treatment with a set) Green theraband scapular retractions 2 x 10 Green theraband shoulder extensions 2 x 10 Paloff press green theraband 2 x 10 each way    02/25/2024  Therapeutic Exercise: -Treadmill, 5 minutes, 2.0 grade, 1.5 for 3 minutes, 1.9 for last 2 minutes, pt states LEs are worn out -Standing hip extensions, 2 sets of 8 reps, RTB at ankles   Neuromuscular Re-education: -Rocker board, side to side and forward and backward, pt demonstrates increased difficulty and requires BUE support for slow controlled weight shifts -Lateral stepping, 2 lap, RTB at  ankles, in parallel bars, one UE support on occasion -Monster walks, 2 lap, RTB at ankles, in parallel bars, one UE support on occasion -Forward lunges on bosu ball, 1 set of 7 reps bilaterally, BUE support required -Lateral step up and overs, 12  inch step, 1 set of 6 reps, bilaterally, BUE support utilized -Sit to stands, 2 sets of 3 rep, with yellow ball trampoline toss (3 per sts)   PATIENT EDUCATION: Education details: Patient educated on exam findings, POC, scope of PT, HEP, and what to expect next visit. Person educated: Patient Education method: Explanation, Demonstration, and Handouts Education comprehension: verbalized understanding, returned demonstration, verbal cues required, and tactile cues required  HOME EXERCISE PROGRAM:' 02/04/24 - Standing Lumbar Extension with Counter  - 1 x daily - 7 x weekly - 1 sets - 10 reps 01/28/24 - Heel Raises with Counter Support  - 1 x daily - 7 x weekly - 2 sets - 10 reps - Standing Ankle Plantar Flexion Dorsiflexion with Counter Support  - 1 x daily - 7 x weekly - 2 sets - 10 reps - tandem stance balance; try not to hold on  - 1 x daily - 7 x weekly - 1 sets - 5 reps - 30 sec hold 01/22/24 Upper trap stretch  Access Code: [email or web address] URL: [email or web address] Date: 01/16/2024 Prepared by: [facility]  Exercises - Cervical Extension AROM with Strap  - 2 x daily - 7 x weekly - 1 sets - 10 reps - Seated Scapular Retraction  - 2 x daily - 7 x weekly - 1 sets - 10 reps - standing single leg balance at the counter (try to not hold on)  - 2 x daily - 7 x weekly - 1 sets - 10 reps - 15 sec hold - Seated Thoracic Lumbar Extension  - 2 x daily - 7 x weekly - 1 sets - 10 reps - Corner Pec Major Stretch  - 2 x daily - 7 x weekly - 1 sets - 5 reps - 10 sec hold - Seated Assisted Cervical Rotation with Towel  - 2 x daily - 7 x weekly - 1 sets - 10 reps - 10 sec hold - Seated Shoulder Shrugs  - 2 x daily - 7 x weekly - 2 sets - 10 reps  Access Code: [email or web address] URL: [email or web address] Date: 12/30/2023 Prepared by: [facility]  Exercises - Cervical Extension AROM with Strap  - 2 x daily - 7 x weekly - 1 sets - 10 reps - Seated Scapular Retraction  - 2 x daily - 7 x  weekly - 1 sets - 10 reps - standing single leg balance at the counter (try to not hold on)  - 2 x daily - 7 x weekly - 1 sets - 10 reps - 15 sec hold Pencil push ups GOALS: Goals reviewed with patient? No  SHORT TERM GOALS: Target date: 01/30/2024  patient will be independent with initial HEP  Baseline: Goal status: MET  2.  Patient will report 50% improvement overall  Baseline: 25% better Goal status: IN PROGRESS  3.  Patient will be able to stand SLS x 5 without UE support each leg to demonstrate improved functional balance Baseline: 5 each with light UE support Goal status: MET   LONG TERM GOALS: Target date: 02/13/2024  Patient will be independent in self management strategies to improve quality of life and functional outcomes.  Baseline:  Goal status: IN PROGRESS  2.  Patient will report  75% improvement overall  Baseline:  Goal status: IN PROGRESS  3.  Patient will be able to stand SLS x 10 without UE support each leg to demonstrate improved functional balance Baseline: 8 on left and 9 on right 02/09/24 Goal status: IN PROGRESS  4.  Patient will improve DGI score by 5 points to demonstrate improve functional balance and gait Baseline: 12/24; 16/24 02/09/24 Goal status: IN PROGRESS  5.  Patient will remain free of falls Baseline: fell in the yard last week trimming bushes Goal status: IN PROGRESS  ASSESSMENT:  CLINICAL IMPRESSION: Patient continues to demonstrate decreased LE strength, decreased gait quality and impaired balance. Today's session with continued work on functional balance and increasing difficulty with balance tasks using foam beams and multifacet challenges.  Patient continues to tend to stand and walk in forward flexed posturing with shuffling gait pattern that all increase his fall risk. Max challenge with tandem walking today.   Patient would continue to benefit from skilled physical therapy for increased endurance with ambulation, increased LE  strength, and improved balance for improved quality of life, improved community ambulation and continued progress towards therapy goals.    Eval:Patient is a 78 y.o. male who was seen today for physical therapy evaluation and treatment for dizziness and giddiness. Patient demonstrates decreased spinal mobility; balance deficits and gait abnormalities which are negatively impacting patient ability to perform ADLs and functional mobility tasks. Patient does not appear to have BPPV.  Patient will benefit from skilled physical therapy services to address these deficits to improve level of function with ADLs, functional mobility tasks, and reduce risk for falls.    OBJECTIVE IMPAIRMENTS: Abnormal gait, decreased activity tolerance, decreased balance, decreased knowledge of condition, decreased mobility, difficulty walking, decreased ROM, and impaired perceived functional ability.   ACTIVITY LIMITATIONS: bending, standing, stairs, transfers, and locomotion level  PARTICIPATION LIMITATIONS: shopping and community activity  REHAB POTENTIAL: Good  CLINICAL DECISION MAKING: Evolving/moderate complexity  EVALUATION COMPLEXITY: Moderate   PLAN:  PT FREQUENCY: 2x/week  PT DURATION: 4 weeks  PLANNED INTERVENTIONS: [id number]- PT Re-evaluation, 97110-Therapeutic exercises, 97530- Therapeutic activity, 97112- Neuromuscular re-education, 97535- Self Care, [id number]- Manual therapy, [phone]- Gait training, [phone]- Orthotic Fit/training, [phone]- Canalith repositioning, [id number]- Aquatic Therapy, [phone]- Splinting, Patient/Family education, Balance training, Stair training, Taping, Dry Needling, Joint mobilization, Joint manipulation, Spinal manipulation, Spinal mobilization, Scar mobilization, and DME instructions.   PLAN FOR NEXT SESSION:   Balance; cervical mobility and postural strengthening.  STM to cervical spine as appropriate; extend 2 x a week for 4 week to address remaining unmet and partially met goals.  Work on  Editor, commissioning; turns, turning head with walking.  Reassess next visit  12:22 PM, 03/09/24 [place]iere[place] Small Jahking Lesser MPT [facility] [place] [phone]

## 2024-03-10 DIAGNOSIS — M9903 Segmental and somatic dysfunction of lumbar region: Secondary | ICD-10-CM | POA: Diagnosis not present

## 2024-03-10 DIAGNOSIS — M9902 Segmental and somatic dysfunction of thoracic region: Secondary | ICD-10-CM | POA: Diagnosis not present

## 2024-03-10 DIAGNOSIS — M546 Pain in thoracic spine: Secondary | ICD-10-CM | POA: Diagnosis not present

## 2024-03-10 DIAGNOSIS — M542 Cervicalgia: Secondary | ICD-10-CM | POA: Diagnosis not present

## 2024-03-10 DIAGNOSIS — M62830 Muscle spasm of back: Secondary | ICD-10-CM | POA: Diagnosis not present

## 2024-03-10 DIAGNOSIS — M9901 Segmental and somatic dysfunction of cervical region: Secondary | ICD-10-CM | POA: Diagnosis not present

## 2024-03-11 ENCOUNTER — Ambulatory Visit (HOSPITAL_COMMUNITY)

## 2024-03-11 DIAGNOSIS — R2689 Other abnormalities of gait and mobility: Secondary | ICD-10-CM

## 2024-03-11 DIAGNOSIS — R42 Dizziness and giddiness: Secondary | ICD-10-CM

## 2024-03-11 NOTE — Therapy (Signed)
 OUTPATIENT PHYSICAL THERAPY VESTIBULAR TREATMENT/DISCHARGE PHYSICAL THERAPY DISCHARGE SUMMARY  Visits from Start of Care: 16  Current functional level related to goals / functional outcomes: See below   Remaining deficits: See below   Education / Equipment: HEP   Patient agrees to discharge. Patient goals were met. Patient is being discharged due to meeting the stated rehab goals.   Patient Name: Greg Mann MRN: [id number] DOB:[birth year], 78 y.o., male Today's Date: 03/11/2024  END OF SESSION:  PT End of Session - 03/11/24 1020     Visit Number 16    Number of Visits 16    Date for PT Re-Evaluation 03/12/24    Authorization Type HTA    PT Start Time 1016    PT Stop Time 1054    PT Time Calculation (min) 38 min    Activity Tolerance Patient tolerated treatment well    Behavior During Therapy WFL for tasks assessed/performed                Past Medical History:  Diagnosis Date   Borderline hyperlipidemia    Chest pain    Dysrhythmia    Exercise-induced tachycardia    Ganglion    left wrist   GERD (gastroesophageal reflux disease)    Thrombocytopenia (HCC)    borderline   Past Surgical History:  Procedure Laterality Date   COLONOSCOPY N/A 04/22/2018   Procedure: COLONOSCOPY;  Surgeon: Shaaron Lamar CHRISTELLA, MD;  Location: [facility];  Service: Endoscopy;  Laterality: N/A;  8:30   GANGLION CYST EXCISION     Left Wrist   KNEE ARTHROSCOPY     Left   TONSILLECTOMY     Patient Active Problem List   Diagnosis Date Noted   RLL pneumonia 03/10/2022   Hypoalbuminemia 03/10/2022   Normocytic anemia 03/10/2022   Atrial flutter (HCC) 09/22/2013   Paroxysmal atrial fibrillation (HCC) 04/23/2013   Rapid palpitations 02/26/2013   FATIGUE 10/24/2009   THROMBOCYTOPENIA 10/11/2009   Chest pain 10/11/2009    PCP: Shona Norleen PEDLAR, MD REFERRING PROVIDER: Shona Norleen PEDLAR, MD  REFERRING DIAG: dizziness and giddiness  THERAPY DIAG:  Dizziness and giddiness  Other  abnormalities of gait and mobility  ONSET DATE: 2 months or more  Rationale for Evaluation and Treatment: Rehabilitation  SUBJECTIVE:   SUBJECTIVE STATEMENT: About 70% better overall    Eval: After sitting a while he gets lightheaded; seems to trip himself; did have a fall forward about 2 months ago. He thinks he doesn't seem to have any balance issues.  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 No  PRECAUTIONS: Fall    WEIGHT BEARING RESTRICTIONS: No  FALLS: Has patient fallen in last 6 months? Yes. Number of falls 1   PLOF: Independent  PATIENT GOALS: help with my balance  OBJECTIVE:  Note: Objective measures were completed at Evaluation unless otherwise noted.  DIAGNOSTIC FINDINGS: none  COGNITION: Overall cognitive status: Within functional limits for tasks assessed   SENSATION: WFL    POSTURE:  rounded shoulders, forward head, and flexed trunk  unable to fully stand upright; limited lumbar extension  Cervical ROM:  limited cervical extension and rotation  Active AROM (deg) 01/16/24 AROM 02/09/24  Flexion 27 44  Extension 35 38  Right lateral flexion 22 28  Left lateral flexion 12 21  Right rotation 55 55  Left rotation 40 50  (Blank rows = not tested)  STRENGTH: not tested  LOWER EXTREMITY MMT:   MMT Right 01/16/24 Left 01/16/24  Hip flexion 4+ 4+  Hip abduction    Hip adduction    Hip internal rotation    Hip external rotation    Knee flexion    Knee extension 5 5  Ankle dorsiflexion 5 5  Ankle plantarflexion    Ankle inversion    Ankle eversion    (Blank rows = not tested)  BED MOBILITY:  Not tested  TRANSFERS: Assistive device utilized: None  Sit to stand: Modified independence Stand to sit: Modified independence Chair to chair: Modified independence Floor: not tested  GAIT: Gait  pattern: decreased arm swing- Right, decreased arm swing- Left, trunk flexed, poor foot clearance- Right, and poor foot clearance- Left Distance walked: 50 ft in clinic Assistive device utilized: None Level of assistance: Modified independence Comments: difficulty with turns; direction changes  FUNCTIONAL TESTS:  5 times sit to stand: 16.51 sec Dynamic Gait Index: 12/24 SLS 5 right and 5  with light UE support DGI 1. Gait level surface (2) Mild Impairment: Walks 20', uses assistive devices, slower speed, mild gait deviations. 2. Change in gait speed (2) Mild Impairment: Is able to change speed but demonstrates mild gait deviations, or not gait deviations but unable to achieve a significant change in velocity, or uses an assistive device. 3. Gait with horizontal head turns (2) Mild Impairment: Performs head turns smoothly with slight change in gait velocity, i.e., minor disruption to smooth gait path or uses walking aid. 4. Gait with vertical head turns 1) Moderate Impairment: Performs head turns with moderate change in gait velocity, slows down, staggers but recovers, can continue to walk. 5. Gait and pivot turn (1) Moderate Impairment: Turns slowly, requires verbal cueing, requires several small steps to catch balance following turn and stop. 6. Step over obstacle (1) Moderate Impairment: Is able to step over box but must stop, then step over. May require verbal cueing. 7. Step around obstacles (1) Moderate Impairment: Is able to clear cones but must significantly slow, speed to accomplish task, or requires verbal cueing. 8. Stairs (2) Mild Impairment: Alternating feet, must use rail.  TOTAL SCORE: 12 / 24   PATIENT SURVEYS:  ABC scale 49.4%  VESTIBULAR ASSESSMENT:  GENERAL OBSERVATION: glasses, forward flexed trunk   SYMPTOM BEHAVIOR:  Subjective history: see above; glasses  Non-Vestibular symptoms: neck pain  Type of dizziness: Imbalance (Disequilibrium) and  Lightheadedness/Faint  Frequency: varies  Duration: varies  Aggravating factors: sitting in choir room then standing up to go upstairs  Relieving factors: rest  Progression of symptoms: unchanged  OCULOMOTOR EXAM:  Ocular Alignment: normal  Ocular ROM: No Limitations  Spontaneous Nystagmus: absent  Gaze-Induced Nystagmus: absent  Smooth Pursuits: intact  Saccades: intact  Convergence/Divergence: 8 inches   VESTIBULAR - OCULAR REFLEX:   Slow VOR: Comment: not tested  VOR Cancellation: Comment: not tested  Head-Impulse Test: not tested  Dynamic Visual Acuity: not tested   POSITIONAL TESTING: Other: not tested  MOTION SENSITIVITY:  Motion Sensitivity Quotient Intensity: 0 = none, 1 = Lightheaded, 2 = Mild, 3 = Moderate, 4 = Severe, 5 = Vomiting  Intensity  1. Sitting to supine   2. Supine to L side   3. Supine to R side   4. Supine to sitting   5. L Hallpike-Dix   6. Up from L    7. R Hallpike-Dix   8. Up from  R    9. Sitting, head tipped to L knee 0  10. Head up from L knee 0  11. Sitting, head tipped to R knee 0  12. Head up from R knee 0  13. Sitting head turns x5 0  14.Sitting head nods x5 0  15. In stance, 180 turn to L    16. In stance, 180 turn to R     OTHOSTATICS: sitting 152/78; standing 142/76  FUNCTIONAL GAIT: see above                                                                                                                             TREATMENT DATE:  03/11/24 Progress note 5 times sit to stand 13.21 sec no UE assist SLS 10 each max ABC scale 1400/1600 87.5% Updated HEP Goal review DGI 1. Gait level surface (2) Mild Impairment: Walks 20', uses assistive devices, slower speed, mild gait deviations. 2. Change in gait speed (3) Normal: Able to smoothly change walking speed without loss of balance or gait deviation. Shows a significant difference in walking speeds between normal, fast and slow speeds. 3. Gait with horizontal head  turns (2) Mild Impairment: Performs head turns smoothly with slight change in gait velocity, i.e., minor disruption to smooth gait path or uses walking aid. 4. Gait with vertical head turns (2) Mild Impairment: Performs head turns smoothly with slight change in gait velocity, i.e., minor disruption to smooth gait path or uses walking aid. 5. Gait and pivot turn (3) Normal: Pivot turns safely within 3 seconds and stops quickly with no loss of balance. 6. Step over obstacle (3) Normal: Is able to step over the box without changing gait speed, no evidence of imbalance. 7. Step around obstacles (3) Normal: Is able to walk around cones safely without changing gait speed; no evidence of imbalance. 8. Stairs (2) Mild Impairment: Alternating feet, must use rail.  TOTAL SCORE: 20 / 24  03/09/24 Nustep seat 10 x 5' dynamic warm up Standing lumbar extension x 10 Seated thoracic extension x 10 Obstacle course 1 tall hurdle; 3 short hurdles; 2 yoga blocks and 3 cones while holding red med ball on cone with CGA 180 degree turns in // bars x 10 Standing on foam beam stacking cones Standing on foam beam tandem stance stacking cones Standing on foam beam rhythmic stabilization 3 x 15 Tandem walking 20 ft down and back with CCG   03/05/2024   BP: 151/79 mmHg, 139/80 Therapeutic Exercise: -Elliptical machine, 5 minutes, pt requires cuing for UE usage for full rotations on machine  Neuromuscular Re-education: -Green theraband lateral stepping, 2 laps, 30 foot laps -Green theraband monster walks, 2 laps, 30 foot laps -Obstacle course for motor planning and multitasking for increased balance challenge -9 inch hurdles, 9 cones, 2 balance pads, all while holding red weighted ball on top of cone, pt requires CGA  03/03/2024  BP: 111/62 mmHg, 114/68 mmHg Therapeutic Exercise: -Treadmill, 5 minutes,  2.0 grade, 1.0 for 3 minutes, 1.4 for last 2 minutes, pt requires less verbal cueing today for picking up  feet and standing up straight   Neuromuscular Re-education: -Green theraband scapular retractions 2 x 10, standing on blue foam -Green theraband shoulder extensions 2 x 10, standing on blue foam -Lateral stepping with 4lb cuff weights, 1 lap 32 feet, pt requires rest break due to leg fatigue and light headed feeling  -Walking marches, with 4lb cuff weights, 1 lap, 40 feet -Sit to stands, 2 sets of 4 rep, with blue ball trampoline toss (3 per sts), one foot on blue foam    02/27/24 Nustep seat 10 x 5' dynamic warm up Heel raises on incline with 1 UE assist with x 20 reps Toe raises on decline x 20 with 1 UE assist Lumbar extension 2 x 10 over bar (ended treatment with a set) BOSU ball alternating lunges 2 x 10 each Seated thoracic extension 2 x 10 (ended treatment with a set) Green theraband scapular retractions 2 x 10 Green theraband shoulder extensions 2 x 10 Paloff press green theraband 2 x 10 each way    02/25/2024  Therapeutic Exercise: -Treadmill, 5 minutes, 2.0 grade, 1.5 for 3 minutes, 1.9 for last 2 minutes, pt states LEs are worn out -Standing hip extensions, 2 sets of 8 reps, RTB at ankles   Neuromuscular Re-education: -Rocker board, side to side and forward and backward, pt demonstrates increased difficulty and requires BUE support for slow controlled weight shifts -Lateral stepping, 2 lap, RTB at ankles, in parallel bars, one UE support on occasion -Monster walks, 2 lap, RTB at ankles, in parallel bars, one UE support on occasion -Forward lunges on bosu ball, 1 set of 7 reps bilaterally, BUE support required -Lateral step up and overs, 12 inch step, 1 set of 6 reps, bilaterally, BUE support utilized -Sit to stands, 2 sets of 3 rep, with yellow ball trampoline toss (3 per sts)   PATIENT EDUCATION: Education details: Patient educated on exam findings, POC, scope of PT, HEP, and what to expect next visit. Person educated: Patient Education method: Explanation,  Demonstration, and Handouts Education comprehension: verbalized understanding, returned demonstration, verbal cues required, and tactile cues required  HOME EXERCISE PROGRAM:' 02/04/24 - Standing Lumbar Extension with Counter  - 1 x daily - 7 x weekly - 1 sets - 10 reps 01/28/24 - Heel Raises with Counter Support  - 1 x daily - 7 x weekly - 2 sets - 10 reps - Standing Ankle Plantar Flexion Dorsiflexion with Counter Support  - 1 x daily - 7 x weekly - 2 sets - 10 reps - tandem stance balance; try not to hold on  - 1 x daily - 7 x weekly - 1 sets - 5 reps - 30 sec hold 01/22/24 Upper trap stretch  Access Code: [email or web address] URL: [email or web address] Date: 01/16/2024 Prepared by: [facility]  Exercises - Cervical Extension AROM with Strap  - 2 x daily - 7 x weekly - 1 sets - 10 reps - Seated Scapular Retraction  - 2 x daily - 7 x weekly - 1 sets - 10 reps - standing single leg balance at the counter (try to not hold on)  - 2 x daily - 7 x weekly - 1 sets - 10 reps - 15 sec hold - Seated Thoracic Lumbar Extension  - 2 x daily - 7 x weekly - 1 sets - 10 reps - Corner Pec Major Stretch  - 2 x  daily - 7 x weekly - 1 sets - 5 reps - 10 sec hold - Seated Assisted Cervical Rotation with Towel  - 2 x daily - 7 x weekly - 1 sets - 10 reps - 10 sec hold - Seated Shoulder Shrugs  - 2 x daily - 7 x weekly - 2 sets - 10 reps  Access Code: [email or web address] URL: [email or web address] Date: 12/30/2023 Prepared by: [facility]  Exercises - Cervical Extension AROM with Strap  - 2 x daily - 7 x weekly - 1 sets - 10 reps - Seated Scapular Retraction  - 2 x daily - 7 x weekly - 1 sets - 10 reps - standing single leg balance at the counter (try to not hold on)  - 2 x daily - 7 x weekly - 1 sets - 10 reps - 15 sec hold Pencil push ups GOALS: Goals reviewed with patient? No  SHORT TERM GOALS: Target date: 01/30/2024  patient will be independent with initial HEP  Baseline: Goal status:  MET  2.  Patient will report 50% improvement overall  Baseline: 25% better Goal status: MET  3.  Patient will be able to stand SLS x 5 without UE support each leg to demonstrate improved functional balance Baseline: 5 each with light UE support Goal status: MET   LONG TERM GOALS: Target date: 02/13/2024  Patient will be independent in self management strategies to improve quality of life and functional outcomes.  Baseline:  Goal status: MET  2.  Patient will report 75% improvement overall  Baseline:  Goal status: IN PROGRESS  3.  Patient will be able to stand SLS x 10 without UE support each leg to demonstrate improved functional balance Baseline: 8 on left and 9 on right 02/09/24 Goal status: MET  4.  Patient will improve DGI score by 5 points to demonstrate improve functional balance and gait Baseline: 12/24; 16/24 02/09/24; 20/24 03/11/24 Goal status: MET  5.  Patient will remain free of falls Baseline: fell in the yard last week trimming bushes Goal status: MET  ASSESSMENT:  CLINICAL IMPRESSION: Progress note today.  Patient has met all set rehab goals and is agreeable to discharge at this time.    Eval:Patient is a 78 y.o. male who was seen today for physical therapy evaluation and treatment for dizziness and giddiness. Patient demonstrates decreased spinal mobility; balance deficits and gait abnormalities which are negatively impacting patient ability to perform ADLs and functional mobility tasks. Patient does not appear to have BPPV.  Patient will benefit from skilled physical therapy services to address these deficits to improve level of function with ADLs, functional mobility tasks, and reduce risk for falls.    OBJECTIVE IMPAIRMENTS: Abnormal gait, decreased activity tolerance, decreased balance, decreased knowledge of condition, decreased mobility, difficulty walking, decreased ROM, and impaired perceived functional ability.   ACTIVITY LIMITATIONS: bending,  standing, stairs, transfers, and locomotion level  PARTICIPATION LIMITATIONS: shopping and community activity  REHAB POTENTIAL: Good  CLINICAL DECISION MAKING: Evolving/moderate complexity  EVALUATION COMPLEXITY: Moderate   PLAN:  PT FREQUENCY: 2x/week  PT DURATION: 4 weeks  PLANNED INTERVENTIONS: 97164- PT Re-evaluation, 97110-Therapeutic exercises, 97530- Therapeutic activity, 97112- Neuromuscular re-education, 97535- Self Care, 02859- Manual therapy, [phone]- Gait training, [phone]- Orthotic Fit/training, [phone]- Canalith repositioning, [id number]- Aquatic Therapy, [phone]- Splinting, Patient/Family education, Balance training, Stair training, Taping, Dry Needling, Joint mobilization, Joint manipulation, Spinal manipulation, Spinal mobilization, Scar mobilization, and DME instructions.   PLAN FOR NEXT SESSION:  discharge  10:53 AM, 03/11/24 Cristianna Cyr  Small Rylie [place] MPT [facility] [place] [phone]

## 2024-04-01 ENCOUNTER — Telehealth: Payer: Self-pay | Admitting: Internal Medicine

## 2024-04-01 DIAGNOSIS — R42 Dizziness and giddiness: Secondary | ICD-10-CM

## 2024-04-01 NOTE — Telephone Encounter (Addendum)
 Wife states that pt has increased dizziness. She states patient fell last night and denies hitting his head. Denies SOB, chest pain. She feels that his medication is too strong. Takes flecainide  50 mg twice daily. And taking Eliquis  5 mg BID. Wife states that she is very concerned on the number dizzy spells he is having. She want to know if he can be seen sooner and have lab work done. Please advise.

## 2024-04-01 NOTE — Telephone Encounter (Signed)
 Wife Arleen) wants a call back to discuss immediate patient concern.  Wife did not want to explain issue.

## 2024-04-06 ENCOUNTER — Ambulatory Visit: Attending: Internal Medicine

## 2024-04-06 DIAGNOSIS — R42 Dizziness and giddiness: Secondary | ICD-10-CM

## 2024-04-06 NOTE — Telephone Encounter (Signed)
 Spoke with pt and informed to stop flecainide , wear Zio patch for 7 day and follow up with Dr. Waddell in 6 wks. Will mail monitor to pt. And send note to scheduling for appt.

## 2024-04-09 DIAGNOSIS — M62830 Muscle spasm of back: Secondary | ICD-10-CM | POA: Diagnosis not present

## 2024-04-09 DIAGNOSIS — M9902 Segmental and somatic dysfunction of thoracic region: Secondary | ICD-10-CM | POA: Diagnosis not present

## 2024-04-09 DIAGNOSIS — M9903 Segmental and somatic dysfunction of lumbar region: Secondary | ICD-10-CM | POA: Diagnosis not present

## 2024-04-09 DIAGNOSIS — M546 Pain in thoracic spine: Secondary | ICD-10-CM | POA: Diagnosis not present

## 2024-04-09 DIAGNOSIS — M542 Cervicalgia: Secondary | ICD-10-CM | POA: Diagnosis not present

## 2024-04-09 DIAGNOSIS — M9901 Segmental and somatic dysfunction of cervical region: Secondary | ICD-10-CM | POA: Diagnosis not present

## 2024-05-01 DIAGNOSIS — R42 Dizziness and giddiness: Secondary | ICD-10-CM | POA: Diagnosis not present

## 2024-05-05 DIAGNOSIS — R42 Dizziness and giddiness: Secondary | ICD-10-CM

## 2024-05-10 ENCOUNTER — Ambulatory Visit: Payer: Self-pay | Admitting: Internal Medicine

## 2024-05-17 DIAGNOSIS — M5441 Lumbago with sciatica, right side: Secondary | ICD-10-CM | POA: Diagnosis not present

## 2024-05-17 DIAGNOSIS — M9905 Segmental and somatic dysfunction of pelvic region: Secondary | ICD-10-CM | POA: Diagnosis not present

## 2024-05-17 DIAGNOSIS — M9903 Segmental and somatic dysfunction of lumbar region: Secondary | ICD-10-CM | POA: Diagnosis not present

## 2024-05-17 DIAGNOSIS — M9902 Segmental and somatic dysfunction of thoracic region: Secondary | ICD-10-CM | POA: Diagnosis not present

## 2024-05-20 ENCOUNTER — Telehealth: Payer: Self-pay

## 2024-05-20 DIAGNOSIS — H353131 Nonexudative age-related macular degeneration, bilateral, early dry stage: Secondary | ICD-10-CM | POA: Diagnosis not present

## 2024-05-20 DIAGNOSIS — H18413 Arcus senilis, bilateral: Secondary | ICD-10-CM | POA: Diagnosis not present

## 2024-05-20 DIAGNOSIS — H2511 Age-related nuclear cataract, right eye: Secondary | ICD-10-CM | POA: Diagnosis not present

## 2024-05-20 DIAGNOSIS — H25043 Posterior subcapsular polar age-related cataract, bilateral: Secondary | ICD-10-CM | POA: Diagnosis not present

## 2024-05-20 DIAGNOSIS — H2513 Age-related nuclear cataract, bilateral: Secondary | ICD-10-CM | POA: Diagnosis not present

## 2024-05-20 NOTE — Telephone Encounter (Signed)
   Patient Name: Greg Mann  DOB: [birth year] MRN: [id number]  Primary Cardiologist: None  Chart reviewed as part of pre-operative protocol coverage. Cataract extractions are recognized in guidelines as low risk surgeries that do not typically require specific preoperative testing or holding of blood thinner therapy. Therefore, given past medical history and time since last visit, based on ACC/AHA guidelines, Greg Mann would be at acceptable risk for the planned procedure without further cardiovascular testing.   I will route this recommendation to the requesting party via Epic fax function and remove from pre-op pool.  Please call with questions.  Orren LOISE Fabry, PA-C 05/20/2024, 4:39 PM

## 2024-05-20 NOTE — Telephone Encounter (Signed)
   Pre-operative Risk Assessment    Patient Name: Greg Mann  DOB: Jul 10, 1946 MRN: [id number]   Date of last office visit: 07/31/23 DANELLE BIRMINGHAM, MD Date of next office visit: 05/21/24 DANELLE BIRMINGHAM, MD   Request for Surgical Clearance    Procedure:  CATARACT EXTRACTION W/ INTRAOCULAR LENS IMPLANTATION OF THE RIGHT EYE FOLLOWED BY THE LEFT EYE  Date of Surgery:  Clearance 07/21/24                                Surgeon:  DR EVALENE RAW Surgeon's Group or Practice Name:  [facility] Phone number:  [phone] Fax number:  [phone]   Type of Clearance Requested:   - Medical  - Pharmacy:  Hold Apixaban  (Eliquis ) PER REQUEST PATIENT DOES NOT NEED TO STOP ANY MEDICATION   Type of Anesthesia:  TOPICAL ANESTHESIA W/ IV MEDICATION   Additional requests/questions:    Signed, Lucie DELENA Ku   05/20/2024, 4:22 PM

## 2024-05-21 ENCOUNTER — Encounter: Payer: Self-pay | Admitting: Internal Medicine

## 2024-05-21 ENCOUNTER — Ambulatory Visit: Attending: Internal Medicine | Admitting: Internal Medicine

## 2024-05-21 VITALS — BP 134/68 | HR 58 | Ht 71.0 in | Wt 170.8 lb

## 2024-05-21 DIAGNOSIS — M9903 Segmental and somatic dysfunction of lumbar region: Secondary | ICD-10-CM | POA: Diagnosis not present

## 2024-05-21 DIAGNOSIS — M9902 Segmental and somatic dysfunction of thoracic region: Secondary | ICD-10-CM | POA: Diagnosis not present

## 2024-05-21 DIAGNOSIS — M5441 Lumbago with sciatica, right side: Secondary | ICD-10-CM | POA: Diagnosis not present

## 2024-05-21 DIAGNOSIS — I480 Paroxysmal atrial fibrillation: Secondary | ICD-10-CM | POA: Diagnosis not present

## 2024-05-21 DIAGNOSIS — M9905 Segmental and somatic dysfunction of pelvic region: Secondary | ICD-10-CM | POA: Diagnosis not present

## 2024-05-21 MED ORDER — PROPAFENONE HCL ER 225 MG PO CP12: 225.0000 mg | ORAL_CAPSULE | Freq: Two times a day (BID) | ORAL | 3 refills | Status: AC

## 2024-05-21 NOTE — Progress Notes (Signed)
 HPI [name] returns today for followup. He is a pleasant 78 yo man with a h/o PAF and HTN. He has been on flecainide  50 mg bid but because of worsening dizziness we stopped the flecainide . He has continued to have some spells and has taken prn flecainide  as a pill in the pocket.  He wore a Zio monitor and had 4% atrial fib with no pauses. Ave hr in afib 88/min, ave hr in NSR 59/min.  No Known Allergies   Current Outpatient Medications  Medication Sig Dispense Refill   albuterol  (VENTOLIN  HFA) 108 (90 Base) MCG/ACT inhaler Inhale 2 puffs into the lungs every 6 (six) hours as needed for wheezing or shortness of breath. 18 g 0   apixaban  (ELIQUIS ) 5 MG TABS tablet TAKE 1 TABLET BY MOUTH TWICE A DAY 60 tablet 5   Cholecalciferol (VITAMIN D3 EXTRA STRENGTH PO) Take by mouth.     HYDROcodone  bit-homatropine (HYCODAN) 5-1.5 MG/5ML syrup Take 5 mLs by mouth every 6 (six) hours as needed for cough. 120 mL 0   Multiple Vitamin (MULTIVITAMIN) tablet Take 1 tablet by mouth daily.     propafenone  (RYTHMOL  SR) 225 MG 12 hr capsule Take 1 capsule (225 mg total) by mouth 2 (two) times daily. 180 capsule 3   sildenafil (VIAGRA) 100 MG tablet Take 100 mg by mouth as needed for erectile dysfunction.     augmented betamethasone dipropionate (DIPROLENE-AF) 0.05 % cream Apply topically 2 (two) times daily as needed. (Patient not taking: Reported on 05/21/2024)     benzonatate  (TESSALON ) 200 MG capsule Take 1 capsule (200 mg total) by mouth 3 (three) times daily as needed for cough. (Patient not taking: Reported on 05/21/2024) 20 capsule 0   No current facility-administered medications for this visit.     Past Medical History:  Diagnosis Date   Borderline hyperlipidemia    Chest pain    Dysrhythmia    Exercise-induced tachycardia    Ganglion    left wrist   GERD (gastroesophageal reflux disease)    Thrombocytopenia (HCC)    borderline    ROS:   All systems reviewed and negative except as  noted in the HPI.   Past Surgical History:  Procedure Laterality Date   COLONOSCOPY N/A 04/22/2018   Procedure: COLONOSCOPY;  Surgeon: Shaaron Lamar HERO, MD;  Location: [facility];  Service: Endoscopy;  Laterality: N/A;  8:30   GANGLION CYST EXCISION     Left Wrist   KNEE ARTHROSCOPY     Left   TONSILLECTOMY       Family History  Problem Relation Age of Onset   Aneurysm Father        Aortic   Coronary artery disease Brother        PCI     Social History   Socioeconomic History   Marital status: Married    Spouse name: Not on file   Number of children: Not on file   Years of education: Not on file   Highest education level: Not on file  Occupational History   Occupation: Airline pilot for [employer], 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  Tobacco Use   Smoking status: Never   Smokeless tobacco: Never  Vaping Use   Vaping status: Never Used  Substance and Sexual Activity   Alcohol use: Yes    Alcohol/week: 0.0 standard drinks of alcohol    Comment: Occasional wine   Drug use: No   Sexual activity: Not on file  Other Topics  Concern   Not on file  Social History Narrative   Married, lives locally, 2 adult children   No regular exercise   Social Drivers of Corporate investment banker Strain: Not on file  Food Insecurity: Not on file  Transportation Needs: Not on file  Physical Activity: Not on file  Stress: Not on file  Social Connections: Not on file  Intimate Partner Violence: Not on file     BP 134/68   Pulse (!) 58   Ht 5' 11 (1.803 m)   Wt 170 lb 12.8 oz (77.5 kg)   SpO2 99%   BMI 23.82 kg/m   Physical Exam:  Well appearing NAD HEENT: Unremarkable Neck:  No JVD, no thyromegally Lymphatics:  No adenopathy Back:  No CVA tenderness Lungs:  Clear HEART:  Regular rate rhythm, no murmurs, no rubs, no clicks Abd:  soft, positive bowel sounds, no organomegally, no rebound, no guarding Ext:  2 plus pulses, no edema, no cyanosis, no clubbing Skin:  No  rashes no nodules Neuro:  CN II through XII intact, motor grossly intact  EKG - nsr  Assess/Plan:  PAF - we discussed the treatment options including dofetilide, amio, and afib ablation. We will try propafenone  as per his request. Coags - he will continue taking eliquis . HTN - his bp is well controlled. He will avoid salty foods.  4. Dyspnea - he is mostly sedentary and I encouraged him to start walking daily, 6 days a week.    Danelle Roben Tatsch,MD

## 2024-05-21 NOTE — Patient Instructions (Addendum)
 Medication Instructions:  Your physician has recommended you make the following change in your medication:  Stop flecainide  Start propafenone  225 mg twice daily  Lab Work: None ordered.  You may go to any [facility] Location for your lab work:  [facility] - [address] ([facility]) - [address] [phone] [address]  [facility] - [address]   [facility]  - [address]   [place] - [address] - [address] [employer]  - [address] ([facility]   If you have labs (blood work) drawn today and your tests are completely normal, you will receive your results only by: [employer] (if you have MyChart)  If you have any lab test that is abnormal or we need to change your treatment, we will call you or send a MyChart message to review the results.  Testing/Procedures: None ordered.  Follow-Up: At [facility], you and your health needs are our priority.  As part of our continuing mission to provide you with exceptional heart care, we have created designated Provider Care Teams.  These Care Teams include your primary Cardiologist (physician) and Advanced Practice Providers (APPs -  Physician Assistants and Nurse Practitioners) who all work together to provide you with the care you need, when you need it.  We recommend signing up for the patient portal called MyChart.  Sign up information is provided on this After Visit Summary.  MyChart is used to connect with patients for Virtual Visits (Telemedicine).  Patients are able to view lab/test results, encounter notes, upcoming appointments, etc.  Non-urgent messages can be sent to your provider as well.   To learn more about what you can do with MyChart, go to [email or web address].    Your next appointment:   EKG in [place] in 2 weeks Follow up in  in April  The format for  your next appointment:   In Person  Provider:   Donnice Primus, MD or one of the following Advanced Practice Providers on your designated Care Team:   Charlies Arthur, [place] Ozell Jodie Passey, [place] Leotis Barrack, NP  Note: Remote monitoring is used to monitor your Pacemaker/ ICD from home. This monitoring reduces the number of office visits required to check your device to one time per year. It allows us  to keep an eye on the functioning of your device to ensure it is working properly.

## 2024-05-24 DIAGNOSIS — M9905 Segmental and somatic dysfunction of pelvic region: Secondary | ICD-10-CM | POA: Diagnosis not present

## 2024-05-24 DIAGNOSIS — M9903 Segmental and somatic dysfunction of lumbar region: Secondary | ICD-10-CM | POA: Diagnosis not present

## 2024-05-24 DIAGNOSIS — M5441 Lumbago with sciatica, right side: Secondary | ICD-10-CM | POA: Diagnosis not present

## 2024-05-24 DIAGNOSIS — M9902 Segmental and somatic dysfunction of thoracic region: Secondary | ICD-10-CM | POA: Diagnosis not present

## 2024-05-26 DIAGNOSIS — R7303 Prediabetes: Secondary | ICD-10-CM | POA: Diagnosis not present

## 2024-05-26 DIAGNOSIS — I480 Paroxysmal atrial fibrillation: Secondary | ICD-10-CM | POA: Diagnosis not present

## 2024-05-26 DIAGNOSIS — M9902 Segmental and somatic dysfunction of thoracic region: Secondary | ICD-10-CM | POA: Diagnosis not present

## 2024-05-26 DIAGNOSIS — M9905 Segmental and somatic dysfunction of pelvic region: Secondary | ICD-10-CM | POA: Diagnosis not present

## 2024-05-26 DIAGNOSIS — M9903 Segmental and somatic dysfunction of lumbar region: Secondary | ICD-10-CM | POA: Diagnosis not present

## 2024-05-26 DIAGNOSIS — M5441 Lumbago with sciatica, right side: Secondary | ICD-10-CM | POA: Diagnosis not present

## 2024-05-27 LAB — LAB REPORT - SCANNED
A1c: 6.1
Albumin, Urine POC: 7.7
Creatinine, POC: 134.4 mg/dL
EGFR: 71
Microalb Creat Ratio: 6

## 2024-05-31 DIAGNOSIS — M9902 Segmental and somatic dysfunction of thoracic region: Secondary | ICD-10-CM | POA: Diagnosis not present

## 2024-05-31 DIAGNOSIS — M9905 Segmental and somatic dysfunction of pelvic region: Secondary | ICD-10-CM | POA: Diagnosis not present

## 2024-05-31 DIAGNOSIS — M9903 Segmental and somatic dysfunction of lumbar region: Secondary | ICD-10-CM | POA: Diagnosis not present

## 2024-05-31 DIAGNOSIS — M5441 Lumbago with sciatica, right side: Secondary | ICD-10-CM | POA: Diagnosis not present

## 2024-06-02 DIAGNOSIS — D696 Thrombocytopenia, unspecified: Secondary | ICD-10-CM | POA: Diagnosis not present

## 2024-06-02 DIAGNOSIS — Z860100 Personal history of colon polyps, unspecified: Secondary | ICD-10-CM | POA: Diagnosis not present

## 2024-06-02 DIAGNOSIS — D649 Anemia, unspecified: Secondary | ICD-10-CM | POA: Diagnosis not present

## 2024-06-02 DIAGNOSIS — M51369 Other intervertebral disc degeneration, lumbar region without mention of lumbar back pain or lower extremity pain: Secondary | ICD-10-CM | POA: Diagnosis not present

## 2024-06-02 DIAGNOSIS — R7303 Prediabetes: Secondary | ICD-10-CM | POA: Diagnosis not present

## 2024-06-02 DIAGNOSIS — F5221 Male erectile disorder: Secondary | ICD-10-CM | POA: Diagnosis not present

## 2024-06-02 DIAGNOSIS — K219 Gastro-esophageal reflux disease without esophagitis: Secondary | ICD-10-CM | POA: Diagnosis not present

## 2024-06-02 DIAGNOSIS — K5900 Constipation, unspecified: Secondary | ICD-10-CM | POA: Diagnosis not present

## 2024-06-02 DIAGNOSIS — E782 Mixed hyperlipidemia: Secondary | ICD-10-CM | POA: Diagnosis not present

## 2024-06-02 DIAGNOSIS — I480 Paroxysmal atrial fibrillation: Secondary | ICD-10-CM | POA: Diagnosis not present

## 2024-06-02 DIAGNOSIS — Z23 Encounter for immunization: Secondary | ICD-10-CM | POA: Diagnosis not present

## 2024-06-02 DIAGNOSIS — M67432 Ganglion, left wrist: Secondary | ICD-10-CM | POA: Diagnosis not present

## 2024-06-04 DIAGNOSIS — M9905 Segmental and somatic dysfunction of pelvic region: Secondary | ICD-10-CM | POA: Diagnosis not present

## 2024-06-04 DIAGNOSIS — M9902 Segmental and somatic dysfunction of thoracic region: Secondary | ICD-10-CM | POA: Diagnosis not present

## 2024-06-04 DIAGNOSIS — M5441 Lumbago with sciatica, right side: Secondary | ICD-10-CM | POA: Diagnosis not present

## 2024-06-04 DIAGNOSIS — M9903 Segmental and somatic dysfunction of lumbar region: Secondary | ICD-10-CM | POA: Diagnosis not present

## 2024-06-07 DIAGNOSIS — M9905 Segmental and somatic dysfunction of pelvic region: Secondary | ICD-10-CM | POA: Diagnosis not present

## 2024-06-07 DIAGNOSIS — M9902 Segmental and somatic dysfunction of thoracic region: Secondary | ICD-10-CM | POA: Diagnosis not present

## 2024-06-07 DIAGNOSIS — M9903 Segmental and somatic dysfunction of lumbar region: Secondary | ICD-10-CM | POA: Diagnosis not present

## 2024-06-07 DIAGNOSIS — M5441 Lumbago with sciatica, right side: Secondary | ICD-10-CM | POA: Diagnosis not present

## 2024-06-09 DIAGNOSIS — M5441 Lumbago with sciatica, right side: Secondary | ICD-10-CM | POA: Diagnosis not present

## 2024-06-09 DIAGNOSIS — M9902 Segmental and somatic dysfunction of thoracic region: Secondary | ICD-10-CM | POA: Diagnosis not present

## 2024-06-09 DIAGNOSIS — M9903 Segmental and somatic dysfunction of lumbar region: Secondary | ICD-10-CM | POA: Diagnosis not present

## 2024-06-09 DIAGNOSIS — M9905 Segmental and somatic dysfunction of pelvic region: Secondary | ICD-10-CM | POA: Diagnosis not present

## 2024-06-23 DIAGNOSIS — M9902 Segmental and somatic dysfunction of thoracic region: Secondary | ICD-10-CM | POA: Diagnosis not present

## 2024-06-23 DIAGNOSIS — M9905 Segmental and somatic dysfunction of pelvic region: Secondary | ICD-10-CM | POA: Diagnosis not present

## 2024-06-23 DIAGNOSIS — M5441 Lumbago with sciatica, right side: Secondary | ICD-10-CM | POA: Diagnosis not present

## 2024-06-23 DIAGNOSIS — M9903 Segmental and somatic dysfunction of lumbar region: Secondary | ICD-10-CM | POA: Diagnosis not present

## 2024-06-25 DIAGNOSIS — M5441 Lumbago with sciatica, right side: Secondary | ICD-10-CM | POA: Diagnosis not present

## 2024-06-25 DIAGNOSIS — M9905 Segmental and somatic dysfunction of pelvic region: Secondary | ICD-10-CM | POA: Diagnosis not present

## 2024-06-25 DIAGNOSIS — M9902 Segmental and somatic dysfunction of thoracic region: Secondary | ICD-10-CM | POA: Diagnosis not present

## 2024-06-25 DIAGNOSIS — M9903 Segmental and somatic dysfunction of lumbar region: Secondary | ICD-10-CM | POA: Diagnosis not present

## 2024-06-29 ENCOUNTER — Telehealth: Payer: Self-pay | Admitting: Internal Medicine

## 2024-06-29 ENCOUNTER — Ambulatory Visit

## 2024-06-29 NOTE — Telephone Encounter (Signed)
 Spoke with pt and appt made for tomorrow at 4 pm for nurse visit.

## 2024-06-29 NOTE — Telephone Encounter (Signed)
 Pt called in stating at his last visit, Dr. Waddell told him to come back in two weeks for an EKG. Asked for it to be done in [place], please advise.

## 2024-06-30 ENCOUNTER — Ambulatory Visit: Attending: Cardiology

## 2024-06-30 VITALS — BP 138/68 | HR 58

## 2024-06-30 DIAGNOSIS — M9903 Segmental and somatic dysfunction of lumbar region: Secondary | ICD-10-CM | POA: Diagnosis not present

## 2024-06-30 DIAGNOSIS — M9905 Segmental and somatic dysfunction of pelvic region: Secondary | ICD-10-CM | POA: Diagnosis not present

## 2024-06-30 DIAGNOSIS — M9902 Segmental and somatic dysfunction of thoracic region: Secondary | ICD-10-CM | POA: Diagnosis not present

## 2024-06-30 DIAGNOSIS — I480 Paroxysmal atrial fibrillation: Secondary | ICD-10-CM

## 2024-06-30 DIAGNOSIS — M5441 Lumbago with sciatica, right side: Secondary | ICD-10-CM | POA: Diagnosis not present

## 2024-06-30 NOTE — Progress Notes (Signed)
 Nurse visit for EKG, started on propafenone  225 mg bid on 05/21/24 by Dr.Taylor   EKG today sinus bradycardia  I will forward to Dr.Taylor for review.

## 2024-07-01 DIAGNOSIS — R2689 Other abnormalities of gait and mobility: Secondary | ICD-10-CM | POA: Diagnosis not present

## 2024-07-01 DIAGNOSIS — M51362 Other intervertebral disc degeneration, lumbar region with discogenic back pain and lower extremity pain: Secondary | ICD-10-CM | POA: Diagnosis not present

## 2024-07-01 DIAGNOSIS — G5792 Unspecified mononeuropathy of left lower limb: Secondary | ICD-10-CM | POA: Diagnosis not present

## 2024-07-01 DIAGNOSIS — M51369 Other intervertebral disc degeneration, lumbar region without mention of lumbar back pain or lower extremity pain: Secondary | ICD-10-CM | POA: Diagnosis not present

## 2024-07-02 DIAGNOSIS — M9902 Segmental and somatic dysfunction of thoracic region: Secondary | ICD-10-CM | POA: Diagnosis not present

## 2024-07-02 DIAGNOSIS — M9903 Segmental and somatic dysfunction of lumbar region: Secondary | ICD-10-CM | POA: Diagnosis not present

## 2024-07-02 DIAGNOSIS — M5441 Lumbago with sciatica, right side: Secondary | ICD-10-CM | POA: Diagnosis not present

## 2024-07-02 DIAGNOSIS — M9905 Segmental and somatic dysfunction of pelvic region: Secondary | ICD-10-CM | POA: Diagnosis not present

## 2024-07-05 DIAGNOSIS — M47816 Spondylosis without myelopathy or radiculopathy, lumbar region: Secondary | ICD-10-CM | POA: Diagnosis not present

## 2024-07-05 DIAGNOSIS — M5416 Radiculopathy, lumbar region: Secondary | ICD-10-CM | POA: Diagnosis not present

## 2024-07-05 DIAGNOSIS — M519 Unspecified thoracic, thoracolumbar and lumbosacral intervertebral disc disorder: Secondary | ICD-10-CM | POA: Diagnosis not present

## 2024-07-05 DIAGNOSIS — M1611 Unilateral primary osteoarthritis, right hip: Secondary | ICD-10-CM | POA: Diagnosis not present

## 2024-07-09 DIAGNOSIS — M545 Low back pain, unspecified: Secondary | ICD-10-CM | POA: Diagnosis not present

## 2024-07-21 DIAGNOSIS — H5371 Glare sensitivity: Secondary | ICD-10-CM | POA: Diagnosis not present

## 2024-07-21 DIAGNOSIS — H2511 Age-related nuclear cataract, right eye: Secondary | ICD-10-CM | POA: Diagnosis not present

## 2024-07-22 DIAGNOSIS — H25012 Cortical age-related cataract, left eye: Secondary | ICD-10-CM | POA: Diagnosis not present

## 2024-07-22 DIAGNOSIS — H25042 Posterior subcapsular polar age-related cataract, left eye: Secondary | ICD-10-CM | POA: Diagnosis not present

## 2024-07-22 DIAGNOSIS — H2512 Age-related nuclear cataract, left eye: Secondary | ICD-10-CM | POA: Diagnosis not present

## 2024-07-23 DIAGNOSIS — M519 Unspecified thoracic, thoracolumbar and lumbosacral intervertebral disc disorder: Secondary | ICD-10-CM | POA: Diagnosis not present

## 2024-07-23 DIAGNOSIS — M4726 Other spondylosis with radiculopathy, lumbar region: Secondary | ICD-10-CM | POA: Diagnosis not present

## 2024-07-31 ENCOUNTER — Inpatient Hospital Stay (HOSPITAL_COMMUNITY)
Admission: EM | Admit: 2024-07-31 | Discharge: 2024-07-31 | DRG: 444 | Disposition: A | Discharge status: Other Acute Inpatient Hospital | Attending: Internal Medicine | Admitting: Internal Medicine

## 2024-07-31 ENCOUNTER — Inpatient Hospital Stay (HOSPITAL_COMMUNITY)

## 2024-07-31 ENCOUNTER — Emergency Department (HOSPITAL_COMMUNITY)

## 2024-07-31 ENCOUNTER — Encounter (HOSPITAL_COMMUNITY): Payer: Self-pay

## 2024-07-31 ENCOUNTER — Other Ambulatory Visit: Payer: Self-pay

## 2024-07-31 DIAGNOSIS — R748 Abnormal levels of other serum enzymes: Secondary | ICD-10-CM | POA: Diagnosis not present

## 2024-07-31 DIAGNOSIS — R531 Weakness: Secondary | ICD-10-CM | POA: Insufficient documentation

## 2024-07-31 DIAGNOSIS — R7989 Other specified abnormal findings of blood chemistry: Secondary | ICD-10-CM | POA: Insufficient documentation

## 2024-07-31 DIAGNOSIS — T426X5A Adverse effect of other antiepileptic and sedative-hypnotic drugs, initial encounter: Secondary | ICD-10-CM | POA: Diagnosis present

## 2024-07-31 DIAGNOSIS — R7401 Elevation of levels of liver transaminase levels: Secondary | ICD-10-CM | POA: Diagnosis not present

## 2024-07-31 DIAGNOSIS — I480 Paroxysmal atrial fibrillation: Secondary | ICD-10-CM | POA: Diagnosis not present

## 2024-07-31 DIAGNOSIS — C250 Malignant neoplasm of head of pancreas: Secondary | ICD-10-CM | POA: Diagnosis not present

## 2024-07-31 DIAGNOSIS — C801 Malignant (primary) neoplasm, unspecified: Secondary | ICD-10-CM | POA: Diagnosis not present

## 2024-07-31 DIAGNOSIS — K838 Other specified diseases of biliary tract: Secondary | ICD-10-CM

## 2024-07-31 DIAGNOSIS — G9341 Metabolic encephalopathy: Secondary | ICD-10-CM | POA: Diagnosis present

## 2024-07-31 DIAGNOSIS — K760 Fatty (change of) liver, not elsewhere classified: Secondary | ICD-10-CM | POA: Diagnosis present

## 2024-07-31 DIAGNOSIS — R410 Disorientation, unspecified: Principal | ICD-10-CM

## 2024-07-31 DIAGNOSIS — G8929 Other chronic pain: Secondary | ICD-10-CM | POA: Diagnosis present

## 2024-07-31 DIAGNOSIS — Z7901 Long term (current) use of anticoagulants: Secondary | ICD-10-CM | POA: Diagnosis not present

## 2024-07-31 DIAGNOSIS — G928 Other toxic encephalopathy: Secondary | ICD-10-CM | POA: Diagnosis present

## 2024-07-31 DIAGNOSIS — D649 Anemia, unspecified: Secondary | ICD-10-CM | POA: Diagnosis not present

## 2024-07-31 DIAGNOSIS — A419 Sepsis, unspecified organism: Secondary | ICD-10-CM | POA: Diagnosis not present

## 2024-07-31 DIAGNOSIS — J9811 Atelectasis: Secondary | ICD-10-CM | POA: Diagnosis not present

## 2024-07-31 DIAGNOSIS — K831 Obstruction of bile duct: Secondary | ICD-10-CM | POA: Diagnosis not present

## 2024-07-31 DIAGNOSIS — I491 Atrial premature depolarization: Secondary | ICD-10-CM | POA: Diagnosis not present

## 2024-07-31 DIAGNOSIS — R9431 Abnormal electrocardiogram [ECG] [EKG]: Secondary | ICD-10-CM

## 2024-07-31 DIAGNOSIS — M545 Low back pain, unspecified: Secondary | ICD-10-CM | POA: Diagnosis not present

## 2024-07-31 DIAGNOSIS — Z8249 Family history of ischemic heart disease and other diseases of the circulatory system: Secondary | ICD-10-CM

## 2024-07-31 DIAGNOSIS — C259 Malignant neoplasm of pancreas, unspecified: Secondary | ICD-10-CM | POA: Diagnosis not present

## 2024-07-31 DIAGNOSIS — K869 Disease of pancreas, unspecified: Secondary | ICD-10-CM | POA: Diagnosis not present

## 2024-07-31 DIAGNOSIS — I482 Chronic atrial fibrillation, unspecified: Secondary | ICD-10-CM | POA: Diagnosis present

## 2024-07-31 DIAGNOSIS — K8689 Other specified diseases of pancreas: Secondary | ICD-10-CM | POA: Diagnosis present

## 2024-07-31 DIAGNOSIS — I4581 Long QT syndrome: Secondary | ICD-10-CM | POA: Diagnosis not present

## 2024-07-31 DIAGNOSIS — R9082 White matter disease, unspecified: Secondary | ICD-10-CM | POA: Diagnosis not present

## 2024-07-31 DIAGNOSIS — R079 Chest pain, unspecified: Secondary | ICD-10-CM | POA: Diagnosis not present

## 2024-07-31 DIAGNOSIS — M5440 Lumbago with sciatica, unspecified side: Secondary | ICD-10-CM | POA: Diagnosis not present

## 2024-07-31 DIAGNOSIS — Z7401 Bed confinement status: Secondary | ICD-10-CM | POA: Diagnosis not present

## 2024-07-31 DIAGNOSIS — K828 Other specified diseases of gallbladder: Secondary | ICD-10-CM | POA: Diagnosis not present

## 2024-07-31 DIAGNOSIS — R1084 Generalized abdominal pain: Secondary | ICD-10-CM | POA: Diagnosis not present

## 2024-07-31 DIAGNOSIS — R4182 Altered mental status, unspecified: Secondary | ICD-10-CM | POA: Diagnosis not present

## 2024-07-31 DIAGNOSIS — R918 Other nonspecific abnormal finding of lung field: Secondary | ICD-10-CM | POA: Diagnosis not present

## 2024-07-31 DIAGNOSIS — E785 Hyperlipidemia, unspecified: Secondary | ICD-10-CM | POA: Diagnosis present

## 2024-07-31 LAB — COMPREHENSIVE METABOLIC PANEL WITH GFR
ALT: 325 U/L — ABNORMAL HIGH (ref 0–44)
AST: 259 U/L — ABNORMAL HIGH (ref 15–41)
Albumin: 3.8 g/dL (ref 3.5–5.0)
Alkaline Phosphatase: 468 U/L — ABNORMAL HIGH (ref 38–126)
Anion gap: 10 (ref 5–15)
BUN: 19 mg/dL (ref 8–23)
CO2: 24 mmol/L (ref 22–32)
Calcium: 8.7 mg/dL — ABNORMAL LOW (ref 8.9–10.3)
Chloride: 105 mmol/L (ref 98–111)
Creatinine, Ser: 0.94 mg/dL (ref 0.61–1.24)
GFR, Estimated: >60 mL/min (ref >60)
Glucose, Bld: 149 mg/dL — ABNORMAL HIGH (ref 70–99)
Potassium: 4.2 mmol/L (ref 3.5–5.1)
Sodium: 138 mmol/L (ref 135–145)
Total Bilirubin: 1.1 mg/dL (ref 0.0–1.2)
Total Protein: 6.3 g/dL — ABNORMAL LOW (ref 6.5–8.1)

## 2024-07-31 LAB — HEPATITIS PANEL, ACUTE
HCV Ab: NONREACTIVE
Hep A IgM: NONREACTIVE
Hep B C IgM: NONREACTIVE
Hepatitis B Surface Ag: NONREACTIVE

## 2024-07-31 LAB — URINALYSIS, ROUTINE W REFLEX MICROSCOPIC
Bilirubin Urine: NEGATIVE
Glucose, UA: NEGATIVE mg/dL
Hgb urine dipstick: NEGATIVE
Ketones, ur: NEGATIVE mg/dL
Leukocytes,Ua: NEGATIVE
Nitrite: NEGATIVE
Protein, ur: NEGATIVE mg/dL
Specific Gravity, Urine: 1.012 (ref 1.005–1.030)
pH: 7 (ref 5.0–8.0)

## 2024-07-31 LAB — CBC
HCT: 33.7 % — ABNORMAL LOW (ref 39.0–52.0)
Hemoglobin: 11.1 g/dL — ABNORMAL LOW (ref 13.0–17.0)
MCH: 32 pg (ref 26.0–34.0)
MCHC: 32.9 g/dL (ref 30.0–36.0)
MCV: 97.1 fL (ref 80.0–100.0)
Platelets: 169 K/uL (ref 150–400)
RBC: 3.47 MIL/uL — ABNORMAL LOW (ref 4.22–5.81)
RDW: 14.6 % (ref 11.5–15.5)
WBC: 6.5 K/uL (ref 4.0–10.5)
nRBC: 0 % (ref 0.0–0.2)

## 2024-07-31 LAB — PROTIME-INR
INR: 1 (ref 0.8–1.2)
Prothrombin Time: 14.1 s (ref 11.4–15.2)

## 2024-07-31 LAB — MAGNESIUM: Magnesium: 2.1 mg/dL (ref 1.7–2.4)

## 2024-07-31 LAB — PROCALCITONIN: Procalcitonin: 0.92 ng/mL

## 2024-07-31 LAB — PHOSPHORUS: Phosphorus: 2.7 mg/dL (ref 2.5–4.6)

## 2024-07-31 MED ORDER — GADOBUTROL 1 MMOL/ML IV SOLN
7.5000 mL | Freq: Once | INTRAVENOUS | Status: AC | PRN
  Administered 2024-07-31: 7.5 mL via INTRAVENOUS

## 2024-07-31 MED ORDER — IOHEXOL 300 MG/ML  SOLN
100.0000 mL | Freq: Once | INTRAMUSCULAR | Status: AC | PRN
  Administered 2024-07-31: 100 mL via INTRAVENOUS

## 2024-07-31 MED ORDER — PROCHLORPERAZINE EDISYLATE 10 MG/2ML IJ SOLN: 10.0000 mg | Freq: Four times a day (QID) | INTRAMUSCULAR | Status: DC | PRN

## 2024-07-31 MED ORDER — PIPERACILLIN-TAZOBACTAM 3.375 G IVPB 30 MIN
3.3750 g | Freq: Once | INTRAVENOUS | Status: AC
  Administered 2024-07-31: 3.375 g via INTRAVENOUS
  Filled 2024-07-31: qty 50

## 2024-07-31 MED ORDER — PIPERACILLIN-TAZOBACTAM 3.375 G IVPB
3.3750 g | Freq: Three times a day (TID) | INTRAVENOUS | Status: DC
  Administered 2024-07-31: 3.375 g via INTRAVENOUS
  Filled 2024-07-31: qty 50

## 2024-07-31 MED ORDER — PROPAFENONE HCL ER 225 MG PO CP12
225.0000 mg | ORAL_CAPSULE | Freq: Two times a day (BID) | ORAL | Status: DC
  Administered 2024-07-31: 225 mg via ORAL
  Filled 2024-07-31 (×3): qty 1

## 2024-07-31 MED ORDER — KETOROLAC TROMETHAMINE 15 MG/ML IJ SOLN: 15.0000 mg | Freq: Four times a day (QID) | INTRAMUSCULAR | Status: DC | PRN

## 2024-07-31 MED ORDER — APIXABAN 5 MG PO TABS
5.0000 mg | ORAL_TABLET | Freq: Two times a day (BID) | ORAL | Status: DC
  Administered 2024-07-31: 5 mg via ORAL
  Filled 2024-07-31: qty 1

## 2024-07-31 NOTE — Progress Notes (Signed)
   07/31/24 1243  Section 1: Provider Certification  Patient Condition Patient stabilized  Reason for Transfer Tertiary Care needed  Benefits of Transfer diagnostics and therapeutic interventions  Level of Care BLS  Accepting Physician Dr. Mai  Sending Physician Dr. Noralee  What is the patient's mental capacity? (Only Physicians and APPs can determine a patient's mental capacity) Full Capacity  Physician Assessment Patient examined and risks and benefits explained  Section 2: Clinician Certification  Accepting hospital or facility Available service confirmed  Accepting Facility [facility]  Transfer Coordinator - name, # Grayce Blush, [place]  Transport By [employer] of Medical Records Sent [employer];Transfer Form  Patient Belongings Disposition Sent with Patient  E - Vitals (15 min before transfer)  Temp 97.6 F (36.4 C)  Pulse Rate (!) 58  BP (!) 144/81  SpO2 99 %

## 2024-07-31 NOTE — Progress Notes (Signed)
   07/31/24 1503  Section 1: Provider Certification  Patient Condition Patient stabilized  Reason for Transfer Tertiary Care  Benefits of Transfer Diagnostics and Therapeutic Interventions  Level of Care BLS  Accepting Physician Dr. Mai  Sending Physician Dr. Noralee  What is the patient's mental capacity? (Only Physicians and APPs can determine a patient's mental capacity) Full Capacity  Physician Assessment Patient examined and risks and benefits explained  Section 2: Clinician Certification  Accepting hospital or facility Available service confirmed  Accepting Facility [facility]  Transfer Coordinator - name, # Grayce Blush [phone]  Report to Next Provider [facility], Room [phone] Called by Mardy, LPN Primary Nurse  Date report called 07/31/24  Time report called 1505  Transport By [employer]  Copies of Medical Records Sent [employer]  Patient Belongings Disposition Sent with Patient  E - Vitals (15 min before transfer)  Temp 97.7 F (36.5 C)  ECG Heart Rate 63  Pulse Rate 63  Resp 18  BP 136/77  SpO2 96 %  Section 4: Provider Reassessment (within 1 hour of departure)  Physician Reassessment Reassessment completed prior to transfer (Provider Only)

## 2024-07-31 NOTE — Consult Note (Signed)
 Consulting  Provider: Dr. Manfred Primary Care Physician:  Shona Norleen PEDLAR, MD Primary Gastroenterologist:  Dr. Shaaron  Reason for Consultation: Biliary obstruction  HPI:  Greg Mann is a 78 y.o. male with a past medical history of paroxysmal atrial fibrillation chronically on apixaban  who presented to [facility], ER early this a.m. with generalized weakness/fatigue and confusion.  Per reports, patient recently started gabapentin  and patient has seemed more disoriented over the last week.  Blood work in the ER revealed AST 259, ALT 325, alk phos 468, T. bili 1.1, INR 1.0, platelets 169.  Viral hepatitis panel negative.  CT chest abdomen pelvis with IV contrast showed significant intrahepatic and intrahepatic biliary ductal dilatation (CBD measuring 1.6 cm), pancreatic ductal dilatation measuring up to 9 mm, fatty liver.  RUQ US  this morning with distended gallbladder with CBD dilation and PD dilation.  MRI/MRCP with marked dilatation of the CBD and pancreatic duct with abrupt termination in the pancreatic head highly suggestive of malignant obstruction.  Patient denies any abdominal pain.  No nausea or vomiting.  Does note decreased appetite for the last week which is abnormal for him.   Past Medical History:  Diagnosis Date   Borderline hyperlipidemia    Chest pain    Dysrhythmia    Exercise-induced tachycardia    Ganglion    left wrist   GERD (gastroesophageal reflux disease)    Thrombocytopenia    borderline    Past Surgical History:  Procedure Laterality Date   cataracts     COLONOSCOPY N/A 04/22/2018   Procedure: COLONOSCOPY;  Surgeon: Shaaron Lamar CHRISTELLA, MD;  Location: [facility];  Service: Endoscopy;  Laterality: N/A;  8:30   GANGLION CYST EXCISION     Left Wrist   KNEE ARTHROSCOPY     Left   TONSILLECTOMY      Prior to Admission medications   Medication Sig Start Date End Date Taking? Authorizing Provider  albuterol  (VENTOLIN  HFA) 108 (90 Base) MCG/ACT  inhaler Inhale 2 puffs into the lungs every 6 (six) hours as needed for wheezing or shortness of breath. 03/11/22   Pearlean Manus, MD  apixaban  (ELIQUIS ) 5 MG TABS tablet TAKE 1 TABLET BY MOUTH TWICE A DAY 03/08/24   Waddell Danelle ORN, MD  augmented betamethasone dipropionate (DIPROLENE-AF) 0.05 % cream Apply topically 2 (two) times daily as needed. Patient not taking: Reported on 06/30/2024 01/16/22   [provider]  benzonatate  (TESSALON ) 200 MG capsule Take 1 capsule (200 mg total) by mouth 3 (three) times daily as needed for cough. Patient not taking: Reported on 06/30/2024 03/11/22   Pearlean Manus, MD  Cholecalciferol (VITAMIN D3 EXTRA STRENGTH PO) Take by mouth.    [provider]  HYDROcodone  bit-homatropine (HYCODAN) 5-1.5 MG/5ML syrup Take 5 mLs by mouth every 6 (six) hours as needed for cough. 03/11/22   Pearlean Manus, MD  Multiple Vitamin (MULTIVITAMIN) tablet Take 1 tablet by mouth daily.    [provider]  propafenone  (RYTHMOL  SR) 225 MG 12 hr capsule Take 1 capsule (225 mg total) by mouth 2 (two) times daily. 05/21/24   Waddell Danelle ORN, MD  sildenafil (VIAGRA) 100 MG tablet Take 100 mg by mouth as needed for erectile dysfunction.    [provider]    Current Facility-Administered Medications  Medication Dose Route Frequency Provider Last Rate Last Admin   apixaban  (ELIQUIS ) tablet 5 mg  5 mg Oral BID Adefeso, Oladapo, DO   5 mg at 07/31/24 0954   ketorolac  (TORADOL )  15 MG/ML injection 15 mg  15 mg Intravenous Q6H PRN Adefeso, Oladapo, DO       piperacillin -tazobactam (ZOSYN ) IVPB 3.375 g  3.375 g Intravenous Q8H Laron Agent, RPH 12.5 mL/hr at 07/31/24 0953 3.375 g at 07/31/24 9046   prochlorperazine  (COMPAZINE ) injection 10 mg  10 mg Intravenous Q6H PRN Adefeso, Oladapo, DO       propafenone  (RYTHMOL  SR) 12 hr capsule 225 mg  225 mg Oral BID Adefeso, Oladapo, DO   225 mg at 07/31/24 1139    Allergies as of 07/31/2024   (No Known Allergies)     Family History  Problem Relation Age of Onset   Aneurysm Father        Aortic   Coronary artery disease Brother        PCI    Social History   Socioeconomic History   Marital status: Married    Spouse name: Not on file   Number of children: Not on file   Years of education: Not on file   Highest education level: Not on file  Occupational History   Occupation: Airline Pilot for [employer], 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  Tobacco Use   Smoking status: Never   Smokeless tobacco: Never  Vaping Use   Vaping status: Never Used  Substance and Sexual Activity   Alcohol use: Yes    Alcohol/week: 0.0 standard drinks of alcohol    Comment: Occasional wine   Drug use: No   Sexual activity: Not on file  Other Topics Concern   Not on file  Social History Narrative   Married, lives locally, 2 adult children   No regular exercise   Social Drivers of Health   Financial Resource Strain: Not on file  Food Insecurity: No Food Insecurity (07/31/2024)   Hunger Vital Sign    Worried About Running Out of Food in the Last Year: Never true    Ran Out of Food in the Last Year: Never true  Transportation Needs: No Transportation Needs (07/31/2024)   PRAPARE - Administrator, Civil Service (Medical): No    Lack of Transportation (Non-Medical): No  Physical Activity: Not on file  Stress: Not on file  Social Connections: Unknown (07/31/2024)   Social Connection and Isolation Panel    Frequency of Communication with Friends and Family: Twice a week    Frequency of Social Gatherings with Friends and Family: Patient declined    Attends Religious Services: More than 4 times per year    Active Member of [employer] or Organizations: Yes    Attends Engineer, Structural: More than 4 times per year    Marital Status: Married  Catering Manager Violence: Not At Risk (07/31/2024)   Humiliation, Afraid, Rape, and Kick questionnaire    Fear of Current or Ex-Partner: No    Emotionally  Abused: No    Physically Abused: No    Sexually Abused: No    Review of Systems: General: Negative for anorexia, weight loss, fever, chills, fatigue, weakness. Eyes: Negative for vision changes.  ENT: Negative for hoarseness, difficulty swallowing , nasal congestion. CV: Negative for chest pain, angina, palpitations, dyspnea on exertion, peripheral edema.  Respiratory: Negative for dyspnea at rest, dyspnea on exertion, cough, sputum, wheezing.  GI: See history of present illness. GU:  Negative for dysuria, hematuria, urinary incontinence, urinary frequency, nocturnal urination.  MS: Negative for joint pain, low back pain.  Derm: Negative for rash or itching.  Neuro: Negative for weakness, abnormal sensation, seizure, frequent headaches,  memory loss, confusion.  Psych: Negative for anxiety, depression Endo: Negative for unusual weight change.  Heme: Negative for bruising or bleeding. Allergy: Negative for rash or hives.  Physical Exam: Vital signs in last 24 hours: Temp:  [97.6 F (36.4 C)-97.9 F (36.6 C)] 97.6 F (36.4 C) (11/22 1243) Pulse Rate:  [57-72] 58 (11/22 1243) Resp:  [15-19] 17 (11/22 0539) BP: (109-144)/(55-83) 144/81 (11/22 1243) SpO2:  [93 %-99 %] 99 % (11/22 1243) Weight:  [77.5 kg] 77.5 kg (11/22 0100) Last BM Date : 07/30/24 General:   Alert,  Well-developed, well-nourished, pleasant and cooperative in NAD Head:  Normocephalic and atraumatic. Eyes:  Sclera clear, no icterus.   Conjunctiva pink. Ears:  Normal auditory acuity. Nose:  No deformity, discharge,  or lesions. Mouth:  No deformity or lesions, dentition normal. Neck:  Supple; no masses or thyromegaly. Lungs:  Clear throughout to auscultation.   No wheezes, crackles, or rhonchi. No acute distress. Heart:  Regular rate and rhythm; no murmurs, clicks, rubs,  or gallops. Abdomen:  Soft, nontender and nondistended. No masses, hepatosplenomegaly or hernias noted. Normal bowel sounds, without guarding, and  without rebound.   Msk:  Symmetrical without gross deformities. Normal posture. Pulses:  Normal pulses noted. Extremities:  Without clubbing or edema. Neurologic:  Alert and  oriented x4;  grossly normal neurologically. Skin:  Intact without significant lesions or rashes. Cervical Nodes:  No significant cervical adenopathy. Psych:  Alert and cooperative. Normal mood and affect.  Intake/Output from previous day: 11/21 0701 - 11/22 0700 In: 18 [IV Piggyback:47] Out: -  Intake/Output this shift: Total I/O In: 240 [P.O.:240] Out: -   Lab Results: Recent Labs    07/31/24 0131  WBC 6.5  HGB 11.1*  HCT 33.7*  PLT 169   BMET Recent Labs    07/31/24 0131  NA 138  K 4.2  CL 105  CO2 24  GLUCOSE 149*  BUN 19  CREATININE 0.94  CALCIUM 8.7*   LFT Recent Labs    07/31/24 0131  PROT 6.3*  ALBUMIN 3.8  AST 259*  ALT 325*  ALKPHOS 468*  BILITOT 1.1   PT/INR Recent Labs    07/31/24 0350  LABPROT 14.1  INR 1.0   Hepatitis Panel Recent Labs    07/31/24 0131  HEPBSAG NON REACTIVE  HCVAB NON REACTIVE  HEPAIGM NON REACTIVE  HEPBIGM NON REACTIVE   C-Diff No results for input(s): CDIFFTOX in the last 72 hours.  Studies/Results: US  Abdomen Limited Result Date: 07/31/2024 CLINICAL DATA:  Pancreatic cancer.  Elevated liver enzymes. EXAM: ULTRASOUND ABDOMEN LIMITED RIGHT UPPER QUADRANT COMPARISON:  Abdominal MRI dated 07/31/2024. FINDINGS: Gallbladder: The gallbladder is distended. There is sludge within the gallbladder. No shadowing stone. No gallbladder wall thickening or pericholecystic fluid. Negative sonographic Murphy's sign. Common bile duct: Diameter: 16 mm. There is dilatation of the biliary tree and pancreatic duct. Liver: There is diffuse increased liver echogenicity most commonly seen in the setting of fatty infiltration. Superimposed inflammation or fibrosis is not excluded. Clinical correlation is recommended. Portal vein is patent on color Doppler imaging  with normal direction of blood flow towards the liver. Other: None. IMPRESSION: 1. Distended gallbladder with dilatation of the common bile duct and main pancreatic duct. These are better evaluated on the recent MRI. No calcified gallstone. 2. Fatty liver. Electronically Signed   By: Vanetta Chou M.D.   On: 07/31/2024 11:24   MR ABDOMEN MRCP W WO CONTAST Result Date: 07/31/2024 EXAM: MRCP WITH AND WITHOUT IV  CONTRAST 07/31/2024 10:59:15 AM TECHNIQUE: Multisequence, multiplanar magnetic resonance images of the abdomen with and without intravenous contrast. MRCP sequences were performed. COMPARISON: CT 07/31/2024. CLINICAL HISTORY: Pancreatic cancer, monitor; ?pancreatic mass. FINDINGS: LIVER: Moderate intrahepatic biliary ductal dilatation within the liver. No enhancing lesion within the liver suggests metastatic disease. GALLBLADDER AND BILIARY SYSTEM: Gallbladder is unremarkable. Marked dilatation of the common hepatic duct to 15 mm and dilatation of the common bile duct to 12 mm. Ductal dilatation ends appropriately in the pancreatic head. The common bile duct obstruction is at the level of the pancreatic head. Marked dilatation of the common bile duct with abrupt termination in the pancreatic head is highly suggestive of malignant obstruction in the pancreatic head. SPLEEN: Unremarkable. PANCREAS/PANCREATIC DUCT: There is severe dilatation of the pancreatic duct measuring 8 mm in diameter. The pancreatic ductal dilatation also ends abruptly in the pancreatic head at the same level of the common bile duct obstruction. There is no discrete lesion identified within the pancreatic head on T2 weighted imaging, T1 weighted imaging, or postcontrast weighted imaging but presumably there is an obstructing lesion in the pancreatic head. Severe dilatation of the pancreatic duct with abrupt termination in the pancreatic head is highly suggestive of malignant obstruction in the pancreatic head. ADRENAL GLANDS:  Unremarkable. KIDNEYS: Unremarkable. LYMPH NODES: No periportal or peripancreatic lymphadenopathy identified. No metastatic adenopathy identified. VASCULATURE: No clear vascular involvement as the lesion is not readily identified. PERITONEUM: No ascites. ABDOMINAL WALL: No hernia. No mass. BOWEL: Grossly unremarkable. No bowel obstruction. BONES: No acute abnormality or worrisome osseous lesion. SOFT TISSUES: Unremarkable. MISCELLANEOUS: Unremarkable. IMPRESSION: 1. Marked dilatation of the common bile duct and pancreatic duct with abrupt termination in the pancreatic head is highly suggestive of malignant obstruction in the pancreatic head. 2. Recommend correlation with serum tumor markers and recommend endoscopic ultrasound for evaluation and tissue sampling. 3. No evidence of hepatic metastasis. 4. No periportal or peripancreatic lymphadenopathy identified. 5. No clear vascular involvement as the lesion is not readily identified. Electronically signed by: Norleen Boxer MD 07/31/2024 11:23 AM EST RP Workstation: [id number]   MR 3D Recon At Scanner Result Date: 07/31/2024 EXAM: MRCP WITH AND WITHOUT IV CONTRAST 07/31/2024 10:59:15 AM TECHNIQUE: Multisequence, multiplanar magnetic resonance images of the abdomen with and without intravenous contrast. MRCP sequences were performed. COMPARISON: CT 07/31/2024. CLINICAL HISTORY: Pancreatic cancer, monitor; ?pancreatic mass. FINDINGS: LIVER: Moderate intrahepatic biliary ductal dilatation within the liver. No enhancing lesion within the liver suggests metastatic disease. GALLBLADDER AND BILIARY SYSTEM: Gallbladder is unremarkable. Marked dilatation of the common hepatic duct to 15 mm and dilatation of the common bile duct to 12 mm. Ductal dilatation ends appropriately in the pancreatic head. The common bile duct obstruction is at the level of the pancreatic head. Marked dilatation of the common bile duct with abrupt termination in the pancreatic head is highly  suggestive of malignant obstruction in the pancreatic head. SPLEEN: Unremarkable. PANCREAS/PANCREATIC DUCT: There is severe dilatation of the pancreatic duct measuring 8 mm in diameter. The pancreatic ductal dilatation also ends abruptly in the pancreatic head at the same level of the common bile duct obstruction. There is no discrete lesion identified within the pancreatic head on T2 weighted imaging, T1 weighted imaging, or postcontrast weighted imaging but presumably there is an obstructing lesion in the pancreatic head. Severe dilatation of the pancreatic duct with abrupt termination in the pancreatic head is highly suggestive of malignant obstruction in the pancreatic head. ADRENAL GLANDS: Unremarkable. KIDNEYS: Unremarkable. LYMPH NODES: No periportal or  peripancreatic lymphadenopathy identified. No metastatic adenopathy identified. VASCULATURE: No clear vascular involvement as the lesion is not readily identified. PERITONEUM: No ascites. ABDOMINAL WALL: No hernia. No mass. BOWEL: Grossly unremarkable. No bowel obstruction. BONES: No acute abnormality or worrisome osseous lesion. SOFT TISSUES: Unremarkable. MISCELLANEOUS: Unremarkable. IMPRESSION: 1. Marked dilatation of the common bile duct and pancreatic duct with abrupt termination in the pancreatic head is highly suggestive of malignant obstruction in the pancreatic head. 2. Recommend correlation with serum tumor markers and recommend endoscopic ultrasound for evaluation and tissue sampling. 3. No evidence of hepatic metastasis. 4. No periportal or peripancreatic lymphadenopathy identified. 5. No clear vascular involvement as the lesion is not readily identified. Electronically signed by: Norleen Boxer MD 07/31/2024 11:23 AM EST RP Workstation: [id number]   CT HEAD WO CONTRAST ( ) Result Date: 07/31/2024 EXAM: CT HEAD WITHOUT CONTRAST 07/31/2024 02:47:07 AM TECHNIQUE: CT of the head was performed without the administration of intravenous contrast.  Automated exposure control, iterative reconstruction, and/or weight based adjustment of the mA/kV was utilized to reduce the radiation dose to as low as reasonably achievable. COMPARISON: None available. CLINICAL HISTORY: Delirium FINDINGS: BRAIN AND VENTRICLES: No acute hemorrhage. No evidence of acute infarct. No hydrocephalus. No extra-axial collection. No mass effect or midline shift. There is atrophy and chronic small vessel disease throughout the deep white matter. ORBITS: No acute abnormality. SINUSES: No acute abnormality. SOFT TISSUES AND SKULL: No acute soft tissue abnormality. No skull fracture. IMPRESSION: 1. No acute intracranial abnormality. 2. Atrophy and chronic small vessel disease throughout the deep white matter, which may contribute to cognitive dysfunction or delirium. Electronically signed by: Franky Crease MD 07/31/2024 03:02 AM EST RP Workstation: [id number]   CT CHEST ABDOMEN PELVIS W CONTRAST Result Date: 07/31/2024 EXAM: CT CHEST, ABDOMEN AND PELVIS WITH CONTRAST 07/31/2024 02:47:07 AM TECHNIQUE: CT of the chest, abdomen and pelvis was performed with the administration of intravenous contrast. 100 mL of iohexol  (OMNIPAQUE ) 300 MG/ML solution was administered. Multiplanar reformatted images are provided for review. Automated exposure control, iterative reconstruction, and/or weight based adjustment of the mA/kV was utilized to reduce the radiation dose to as low as reasonably achievable. COMPARISON: None available. CLINICAL HISTORY: Sepsis; confusion and LFT elevation and ?lower lobe pna. FINDINGS: CHEST: MEDIASTINUM AND LYMPH NODES: Cardiomegaly. Scattered coronary artery and aortic atherosclerosis. The central airways are clear. No mediastinal, hilar or axillary lymphadenopathy. LUNGS AND PLEURA: Bibasilar dependent opacities most compatible with atelectasis. No pulmonary edema. No pleural effusion or pneumothorax. ABDOMEN AND PELVIS: LIVER: Diffuse low density throughout the liver is  compatible with fatty infiltration. GALLBLADDER AND BILE DUCTS: Intrahepatic and extrahepatic biliary ductal dilatation. Common bile duct measures up to 1.6 cm. SPLEEN: No acute abnormality. PANCREAS: Pancreatic ductal dilatation measuring up to 9 mm. No visible pancreatic head mass, but given the pancreatic ductal and biliary dilatation, recommend MRI to exclude pancreatic head mass. ADRENAL GLANDS: No acute abnormality. KIDNEYS, URETERS AND BLADDER: No stones in the kidneys or ureters. No hydronephrosis. No perinephric or periureteral stranding. Urinary bladder is unremarkable. GI AND BOWEL: Stomach demonstrates no acute abnormality. Large stool burden throughout the colon. There is no bowel obstruction. REPRODUCTIVE ORGANS: No acute abnormality. PERITONEUM AND RETROPERITONEUM: No ascites. No free air. VASCULATURE: Aorta is normal in caliber. ABDOMINAL AND PELVIS LYMPH NODES: No lymphadenopathy. REPRODUCTIVE ORGANS: No acute abnormality. BONES AND SOFT TISSUES: No acute osseous abnormality. No focal soft tissue abnormality. IMPRESSION: 1. Intrahepatic and extrahepatic biliary ductal dilatation, with the common bile duct measuring up to 1.6 cm. 2.  Pancreatic ductal dilatation measuring up to 9 mm. No visible pancreatic head mass; given the pancreatic ductal and biliary dilatation, recommend MRI to exclude a pancreatic head mass. 3. Bibasilar dependent opacities most compatible with atelectasis. 4. Fatty liver Electronically signed by: Franky Crease MD 07/31/2024 03:01 AM EST RP Workstation: [id number]   DG Chest Port 1 View Result Date: 07/31/2024 EXAM: 1 VIEW(S) XRAY OF THE CHEST 07/31/2024 01:28:26 AM COMPARISON: 04/23/2022 CLINICAL HISTORY: ams FINDINGS: LUNGS AND PLEURA: Minimal left basilar subsegmental atelectasis. No pleural effusion. No pneumothorax. HEART AND MEDIASTINUM: Slightly prominent heart size, likely accentuated by technique. BONES AND SOFT TISSUES: No acute osseous abnormality. IMPRESSION: 1.  Mild left basilar atelectasis. Electronically signed by: Oneil Devonshire MD 07/31/2024 01:35 AM EST RP Workstation: [id number]    Assessment: Patient is a pleasant 78 year old gentleman with history of PAF on apixaban , who presented to [facility], ER with encephalopathy and generalized weakness/fatigue, poor appetite x 1 week, found to have significantly abnormal LFTs which prompted further evaluation with CT, ultrasound and MRI/MRCP.  MRI/MRCP with marked dilatation of the CBD and pancreatic duct with abrupt termination in the pancreatic head highly suggestive of malignant obstruction.  Patient without signs of ascending cholangitis, denies any abdominal pain, T. bili is reassuring.  Plan: Discussed in depth with patient today.  Concerns for malignant obstruction in the pancreatic head.  I have reached out to [facility] advanced endoscopy for further recommendations in regards to potential EUS and/or ERCP.  Given lack of abdominal pain and normal T. bili, perhaps he can be further evaluated in the outpatient setting.  Continue to monitor daily LFTs.  Encephalopathy improved since discontinuing his recently started gabapentin .  Continue to hold and avoid all delirium medications.  Further recommendations to follow.  Carlin POUR. Cindie, D.O. Gastroenterology and Hepatology [facility]    LOS: 0 days     07/31/2024, 12:54 PM

## 2024-07-31 NOTE — Assessment & Plan Note (Addendum)
 Multifactorial encephalopathy, clinically has resolved with supportive medical therapy. His wife at the bedside and confirms this has resolved and he is back to his baseline.  Discontinued gabapentin 

## 2024-07-31 NOTE — TOC CM/SW Note (Signed)
 Transition of Care [facility]) - Inpatient Brief Assessment   Patient Details  Name: Greg Mann MRN: [id number] Date of Birth: [birth year]  Transition of Care [facility]) CM/SW Contact:    Lucie Lunger, LCSWA Phone Number: 07/31/2024, 11:19 AM   Clinical Narrative: CSW notes that PT eval is pending at this time. TOC to follow for recommendations.   Transition of Care Department [facility]) has reviewed patient and no TOC needs have been identified at this time. We will continue to monitor patient advancement through interdiciplinary progression rounds. If new patient transition needs arise, please place a TOC consult.  Transition of Care Asessment: Insurance and Status: Insurance coverage has been reviewed Patient has primary care physician: Yes Home environment has been reviewed: From home Prior level of function:: Independent Prior/Current Home Services: No current home services Social Drivers of Health Review: SDOH reviewed interventions complete, SDOH reviewed no interventions necessary Readmission risk has been reviewed: Yes Transition of care needs: transition of care needs identified, TOC will continue to follow

## 2024-07-31 NOTE — Assessment & Plan Note (Signed)
 Continue with propafenone  and anticoagulation with apixaban .  Currently patient on sinus rhythm.

## 2024-07-31 NOTE — ED Provider Notes (Signed)
 [facility]-EMERGENCY DEPT [facility] Emergency Department Provider Note MRN:  [id number]  Arrival date & time: 07/31/24     Chief Complaint   Weakness   History of Present Illness   Greg Mann is a 78 y.o. year-old male with no pertinent past medical history presenting to the ED with chief complaint of weakness.  Generalized weakness and confusion recently.  Family thinks it started ever since patient began a new medication, namely gabapentin .  Seems a bit more disoriented.  Patient denies any chest pain or shortness of breath, no abdominal pain, no headache, no nausea or vomiting, denies fever.  Has noticed some slight increase in leg swelling but denies any other complaints.  Does feel a bit confused lately.  May be lightheaded as well.  Review of Systems  A thorough review of systems was obtained and all systems are negative except as noted in the HPI and PMH.   Patient's Health History    Past Medical History:  Diagnosis Date   Borderline hyperlipidemia    Chest pain    Dysrhythmia    Exercise-induced tachycardia    Ganglion    left wrist   GERD (gastroesophageal reflux disease)    Thrombocytopenia    borderline    Past Surgical History:  Procedure Laterality Date   cataracts     COLONOSCOPY N/A 04/22/2018   Procedure: COLONOSCOPY;  Surgeon: Shaaron Lamar CHRISTELLA, MD;  Location: [facility] ENDO SUITE;  Service: Endoscopy;  Laterality: N/A;  8:30   GANGLION CYST EXCISION     Left Wrist   KNEE ARTHROSCOPY     Left   TONSILLECTOMY      Family History  Problem Relation Age of Onset   Aneurysm Father        Aortic   Coronary artery disease Brother        PCI    Social History   Socioeconomic History   Marital status: Married    Spouse name: Not on file   Number of children: Not on file   Years of education: Not on file   Highest education level: Not on file  Occupational History   Occupation: Airline Pilot for [employer], 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  Tobacco Use    Smoking status: Never   Smokeless tobacco: Never  Vaping Use   Vaping status: Never Used  Substance and Sexual Activity   Alcohol use: Yes    Alcohol/week: 0.0 standard drinks of alcohol    Comment: Occasional wine   Drug use: No   Sexual activity: Not on file  Other Topics Concern   Not on file  Social History Narrative   Married, lives locally, 2 adult children   No regular exercise   Social Drivers of Corporate Investment Banker Strain: Not on file  Food Insecurity: Not on file  Transportation Needs: Not on file  Physical Activity: Not on file  Stress: Not on file  Social Connections: Not on file  Intimate Partner Violence: Not on file     Physical Exam   Vitals:   07/31/24 0145 07/31/24 0200  BP: (!) 109/55 116/62  Pulse: 62 72  Resp: 16   SpO2: 93% 93%    CONSTITUTIONAL: Well-appearing, NAD NEURO/PSYCH:  Alert and oriented x 3, normal and symmetric strength and sensation, normal coordination, normal speech EYES:  eyes equal and reactive ENT/NECK:  no LAD, no JVD CARDIO: Regular rate, well-perfused, normal S1 and S2 PULM:  CTAB no wheezing or rhonchi GI/GU:  non-distended, non-tender MSK/SPINE:  No gross deformities, no edema SKIN:  no rash, atraumatic   *Additional and/or pertinent findings included in MDM below  Diagnostic and Interventional Summary    EKG Interpretation Date/Time:  Saturday July 31 2024 00:59:24 EST Ventricular Rate:  65 PR Interval:  184 QRS Duration:  122 QT Interval:  494 QTC Calculation: 514 R Axis:   89  Text Interpretation: Sinus rhythm Atrial premature complexes Left atrial enlargement Nonspecific intraventricular conduction delay Confirmed by Theadore Sharper [phone]) on 07/31/2024 3:31:43 AM       Labs Reviewed  CBC - Abnormal; Notable for the following components:      Result Value   RBC 3.47 (*)    Hemoglobin 11.1 (*)    HCT 33.7 (*)    All other components within normal limits  COMPREHENSIVE METABOLIC PANEL  WITH GFR - Abnormal; Notable for the following components:   Glucose, Bld 149 (*)    Calcium 8.7 (*)    Total Protein 6.3 (*)    AST 259 (*)    ALT 325 (*)    Alkaline Phosphatase 468 (*)    All other components within normal limits  CULTURE, BLOOD (ROUTINE X 2)  CULTURE, BLOOD (ROUTINE X 2)  URINALYSIS, ROUTINE W REFLEX MICROSCOPIC  PROTIME-INR    CT CHEST ABDOMEN PELVIS W CONTRAST  Final Result    CT HEAD WO CONTRAST ( )  Final Result    DG Chest Port 1 View  Final Result      Medications  piperacillin -tazobactam (ZOSYN ) IVPB 3.375 g (has no administration in time range)  iohexol  (OMNIPAQUE ) 300 MG/ML solution 100 mL (100 mLs Intravenous Contrast Given 07/31/24 0232)     Procedures  /  Critical Care Procedures  ED Course and Medical Decision Making  Initial Impression and Ddx Differential diagnosis includes medication side effect, anemia, electrolyte disturbance, underlying infection such as pneumonia or UTI.  Past medical/surgical history that increases complexity of ED encounter: None  Interpretation of Diagnostics I personally reviewed the Laboratory Testing and my interpretation is as follows: LFT elevation  CT imaging reveals biliary ductal dilation at the CBD as well as the pancreatic duct.  Patient Reassessment and Ultimate Disposition/Management     Case discussed with Dr. Cindie of gastroenterology.  Patient is appropriate for admission here at [facility] for continued management of altered mental status, MRI abdomen.  Patient management required discussion with the following services or consulting groups:  Hospitalist Service and Gastroenterology  Complexity of Problems Addressed Acute illness or injury that poses threat of life of bodily function  Additional Data Reviewed and Analyzed Further history obtained from: Further history from spouse/family member  Additional Factors Impacting ED Encounter Risk Consideration of  hospitalization  Sharper HERO. Theadore, MD [facility] Emergency Medicine [facility] [email or web address]  Final Clinical Impressions(s) / ED Diagnoses     ICD-10-CM   1. Confusion  R41.0     2. Weakness  R53.1     3. LFT elevation  R79.89     4. Biliary tract distention  K83.8       ED Discharge Orders     None        Discharge Instructions Discussed with and Provided to Patient:   Discharge Instructions   None      Theadore Sharper HERO, MD 07/31/24 [phone]

## 2024-07-31 NOTE — Assessment & Plan Note (Signed)
 Patient will be transferred to tertiary care for further advance endoscopic procedures.

## 2024-07-31 NOTE — Hospital Course (Addendum)
 Greg Mann was admitted to the hospital with the working diagnosis of acute metabolic encephalopathy, found to have a possible malignant obstruction in the pancreatic head.   78 yo male with the past medical history of paroxysmal atrial fibrillation, and chronic back pain who presented with several days of generalized weakness and confusion, different than his baseline. Had had recently prescribed gabapentin  (11/12) ten days prior to admission for back and sciatica. Since then his symptoms appeared, and have persisted despite reducing the dose and sometimes holding medication. Reported poor appetite and low po intake over the last 3 days prior to admission.  On his initial physical examination his blood pressure was 119/57, HR 66, RR 19 and 02 saturation 93%  Lungs with no wheezing or rhonchi, heart with S1 and S2 present and regular, abdomen soft and non tender, not distended, no masses, no lower extremity edema.   Na 138, K 4.2 Cl 105 bicarbonate 24 glucose 149 bun 19 cr 0,94 ALK P 468  total bilirubin 1.1  AST 259 ALT 325  Wbc 6,5 hgb 11.1 plt 169  INR 1,0  Urine analysis SG 1,012, negative protein, negative hgb and negative leukocytes.   Hepatitis panel negative   CT head with no acute intracranial abnormalities Atrophy and chronic small vessel disease throughout the deep white matter.  CT chest abdomen and pelvis with intrahepatic and extrahepatic biliary ductal dilatation, with the common bile duct measuring up to 1,6 cm Pancreatic ductal dilatation measuring up to 9 mm, no visible pancreatic head mass.  Fatty liver.   Abdominal US  with distended gallbladder with dilatation of the common bile duct and main pancreatic duct.   MRCP with marked dilatation of the common bile duct and pancreatic duct with abrupt termination in the pancreatic head, highly suggestive of malignant obstruction in the pancreatic head.  No evidence of hepatic metastasis.  No periportal or peripancreatic  lymphadenopathy identified.  No clear vascular involvement as the lesion is not readily identified.  Chest radiograph with no infiltrates or effusions, no cardiomegaly.   EKG 65 bpm, normal axis, qtc 514, sinus rhythm with PAC, no significant ST segment or  T wave changes.   Patient was placed on IV fluids and IV antibiotic therapy.  GI was consulted.  Decision was made to transfer to tertiary care for further advance endoscopic procedure.

## 2024-07-31 NOTE — H&P (Signed)
 History and Physical    Patient: Greg Mann FMW:[id number] DOB: 04/10/1946 DOA: 07/31/2024 DOS: the patient was seen and examined on 07/31/2024 PCP: Shona Norleen PEDLAR, MD  Patient coming from: Home  Chief Complaint:  Chief Complaint  Patient presents with   Weakness   HPI: NIKITAS Mann is a 78 y.o. male with medical history significant of PAF on eliquis  and propafenone , low back pain who presented to the emergency department due to several days of generalized weakness and confusion.  He also complained of intermittent abdominal pain which started a few days ago.  Patient was started on gabapentin  about 10 days ago (11/12) due to low back pain with sciatica and after a few days on this medication, wife noted increased confusion and lightheadedness, so, she reduced the dose and sometimes skip 1 or 2 doses.  At bedside, patient was alert and oriented, though he takes his time to answer questions.  ED course In the emergency department, patient was hemodynamically stable.  Workup in the ED showed normocytic anemia and BMP was normal except for blood glucose of 149, calcium 8.7, albumin 3.8, AST 259, ALT 325, ALP 468.  Urinalysis was normal. CT chest, abdomen and pelvis with contrast showed intrahepatic and extrahepatic biliary ductal dilatation, with the common bile duct measuring up to 1.6 cm. Pancreatic ductal dilatation measuring up to 9 mm. Patient was empirically treated with Zosyn  for presumed intra-abdominal infection. Gastroenterologist (Dr. Cindie) was consulted and recommended MRI of abdomen will plan to follow-up on patient on consult.   Review of Systems: As mentioned in the history of present illness. All other systems reviewed and are negative. Past Medical History:  Diagnosis Date   Borderline hyperlipidemia    Chest pain    Dysrhythmia    Exercise-induced tachycardia    Ganglion    left wrist   GERD (gastroesophageal reflux disease)    Thrombocytopenia    borderline    Past Surgical History:  Procedure Laterality Date   cataracts     COLONOSCOPY N/A 04/22/2018   Procedure: COLONOSCOPY;  Surgeon: Shaaron Lamar CHRISTELLA, MD;  Location: [facility];  Service: Endoscopy;  Laterality: N/A;  8:30   GANGLION CYST EXCISION     Left Wrist   KNEE ARTHROSCOPY     Left   TONSILLECTOMY     Social History:  reports that he has never smoked. He has never used smokeless tobacco. He reports current alcohol use. He reports that he does not use drugs.  No Known Allergies  Family History  Problem Relation Age of Onset   Aneurysm Father        Aortic   Coronary artery disease Brother        PCI    Prior to Admission medications   Medication Sig Start Date End Date Taking? Authorizing Provider  albuterol  (VENTOLIN  HFA) 108 (90 Base) MCG/ACT inhaler Inhale 2 puffs into the lungs every 6 (six) hours as needed for wheezing or shortness of breath. 03/11/22   Pearlean Manus, MD  apixaban  (ELIQUIS ) 5 MG TABS tablet TAKE 1 TABLET BY MOUTH TWICE A DAY 03/08/24   Waddell Danelle ORN, MD  augmented betamethasone dipropionate (DIPROLENE-AF) 0.05 % cream Apply topically 2 (two) times daily as needed. Patient not taking: Reported on 06/30/2024 01/16/22   [provider]  benzonatate  (TESSALON ) 200 MG capsule Take 1 capsule (200 mg total) by mouth 3 (three) times daily as needed for cough. Patient not taking: Reported on 06/30/2024 03/11/22   Emokpae,  Courage, MD  Cholecalciferol (VITAMIN D3 EXTRA STRENGTH PO) Take by mouth.    [provider]  HYDROcodone  bit-homatropine (HYCODAN) 5-1.5 MG/5ML syrup Take 5 mLs by mouth every 6 (six) hours as needed for cough. 03/11/22   Pearlean Manus, MD  Multiple Vitamin (MULTIVITAMIN) tablet Take 1 tablet by mouth daily.    [provider]  propafenone  (RYTHMOL  SR) 225 MG 12 hr capsule Take 1 capsule (225 mg total) by mouth 2 (two) times daily. 05/21/24   Waddell Danelle ORN, MD  sildenafil (VIAGRA) 100 MG tablet Take 100 mg by  mouth as needed for erectile dysfunction.    [provider]    Physical Exam: Vitals:   07/31/24 0115 07/31/24 0130 07/31/24 0145 07/31/24 0200  BP: (!) 119/57 110/83 (!) 109/55 116/62  Pulse: 66 63 62 72  Resp: 19 16 16    SpO2: 94% 93% 93% 93%  Weight:      Height:        General: Elderly male. Awake and alert and oriented x3. Not in any acute distress.  HEENT: NCAT.  PERRLA. EOMI. Sclerae anicteric.  Moist mucosal membranes. Neck: Neck supple without lymphadenopathy. No carotid bruits. No masses palpated.  Cardiovascular: Regular rate with normal S1-S2 sounds. No murmurs, rubs or gallops auscultated. No JVD.  Respiratory: Clear breath sounds.  No accessory muscle use. Abdomen: Soft, nontender, nondistended. Active bowel sounds. No masses or hepatosplenomegaly  Skin: No rashes, lesions, or ulcerations.  Dry, warm to touch. Musculoskeletal:  2+ dorsalis pedis and radial pulses. Good ROM.  No contractures  Psychiatric: Intact judgment and insight.  Mood appropriate to current condition. Neurologic: No focal neurological deficits. Strength is 5/5 x 4.  CN II - XII grossly intact.  Data Reviewed: EKG personally reviewed showed normal sinus rhythm at a rate of 65 bpm with QTc of 514 ms  Assessment and Plan: Acute metabolic encephalopathy Patient was reported to be more fatigued and drowsy since the last few days.  He was recently started on gabapentin , this will be held at this time Avoid all CNS acting drugs at this time Continue delirium precautions  Generalized weakness possibly due to above Continue fall precaution Continue PT/OT eval and treat  Transaminasemia AST 259, ALT 325, ALP 468 CT abdomen and pelvis showed intrahepatic and extrahepatic, common bile duct and pancreatic duct dilatation. Hepatitis panel will be checked RUQ ultrasound will be done in the morning MRI of abdomen will be done in the morning Empiric IV Zosyn  for presumed intra-abdominal  infection was started, we shall continue with same at this time with plan to de-escalate/discontinue based on procalcitonin level. Gastroenterologist (Dr. Cindie) was consulted and will follow-up on patient in the morning per EDP  Prolonged QT interval QTc 514 ms Avoid QT prolonging drugs Magnesium level will be checked Repeat EKG in the morning  Chronic atrial fibrillation Continue propafenone , Eliquis   Chronic low back pain Continue IV Toradol  15 mg every 6 hours as needed   Advance Care Planning: Full code  Consults: Gastroenterology  Family Communication: Wife and daughter at bedside (all questions answered to satisfaction)  Severity of Illness: The appropriate patient status for this patient is INPATIENT. Inpatient status is judged to be reasonable and necessary in order to provide the required intensity of service to ensure the patient's safety. The patient's presenting symptoms, physical exam findings, and initial radiographic and laboratory data in the context of their chronic comorbidities is felt to place them at high risk for further clinical deterioration. Furthermore,  it is not anticipated that the patient will be medically stable for discharge from the hospital within 2 midnights of admission.   * I certify that at the point of admission it is my clinical judgment that the patient will require inpatient hospital care spanning beyond 2 midnights from the point of admission due to high intensity of service, high risk for further deterioration and high frequency of surveillance required.*  Author: Kinslea Frances, DO 07/31/2024 4:01 AM  For on call review [email or web address].

## 2024-07-31 NOTE — ED Triage Notes (Signed)
 Pt c/o weakness and altered ness that started after being put on gabapentin . Family said pt seemed more disoriented and weaker than usual. Hx of afib controlled with eliquis .

## 2024-07-31 NOTE — H&P (Signed)
 ------------------------------------------------------------------------------- Attestation signed by Christine Myron, MD at 08/01/2024  2:38 AM (Updated) He was sleeping when I went to see him Imaging reports and labs  GI consult in AM Last dose of Eliquis  ? On Tele, monitor QTC  -------------------------------------------------------------------------------   HOSPITAL MEDICINE - ADMISSION NOTE  Primary Care Physician No primary care provider on file.  Primary Contact No emergency contact information on file.   Chief Complaint Weakness   History of Present Illness   Greg Mann is a 78 y.o. male with history of PAF on eliquis  and propafenone , low back pain who presented to the emergency department due to several days of generalized weakness and confusion. He also complained of intermittent abdominal pain which started a few days ago. Patient was started on gabapentin  about 10 days ago (11/12) due to low back pain with sciatica and after a few days on this medication, wife noted increased confusion and lightheadedness, so, she reduced the dose and sometimes skip 1 or 2 doses. At bedside, patient was alert and oriented. Endorses intermittent abdominal pain. Has been tolerating PO intake. Denies chest pain, shortness of breath, nausea, vomiting, or diarrhea.   At the OSH a CT chest, abdomen and pelvis with contrast showed intrahepatic and extrahepatic biliary ductal dilatation, with the common bile duct measuring up to 1.6 cm. Pancreatic ductal dilatation measuring up to 9 mm. Patient was empirically treated with Zosyn  for presumed intra-abdominal infection.  An MRCP with and without contrast showed Marked dilatation of the common bile duct and pancreatic duct with abrupt termination in the pancreatic head is highly suggestive of malignant obstruction in the pancreatic head. He was transferred here for ERCP evaluation of possible pancreatic mass.  Review of Systems 10 point review  of negative unless noted in HPI   PMFSH and Home Medications   Past Medical History  Medical History[1]  Surgical History Surgical History[2]   Family History Family History[3]  Social History Social History[4]   Allergies Patient has no allergy information on record.  Medications Prescriptions Prior to Admission[5]   Objective   Temp:  [97.9 F (36.6 C)] 97.9 F (36.6 C) Heart Rate:  [60] 60 Resp:  [18] 18 BP: (136)/(81) 136/81    Physical Exam Constitutional:      General: He is not in acute distress. Cardiovascular:     Rate and Rhythm: Normal rate. Rhythm irregular.  Pulmonary:     Effort: Pulmonary effort is normal.     Breath sounds: Normal breath sounds.  Abdominal:     General: Abdomen is flat.     Palpations: Abdomen is soft.     Tenderness: There is no abdominal tenderness.  Skin:    General: Skin is warm and dry.     Coloration: Skin is not jaundiced.  Neurological:     Mental Status: He is alert.                 No results found for this visit on 07/31/24 (from the past week).   No results found.  Assessment and Plan   Assessment & Plan Weakness Altered mental status -Patient was reported to be more fatigued and drowsy over the last few days. He was recently started on gabapentin  and wife decreased/held doses  -CT head at OSH with no acute intracranial abnormalities  -CXR with bibasilar atelectasis but not concerning for PNA -UA not infectious  Plan: -Avoid all CNS acting drugs at this time -Continue delirium precautions  -PT/OT eval  Biliary obstruction (CMD)  Transaminasemia -AST 259, ALT 325, ALP 468 -CT abdomen and pelvis showed intrahepatic and extrahepatic, common bile duct and pancreatic duct dilatation. -Hepatitis panel non reactive -MRCP w/wo contrast showing Marked dilatation of the common bile duct and pancreatic duct with abrupt termination in the pancreatic head is highly suggestive of malignant obstruction in the  pancreatic head. Plan: -Empiric IV Zosyn  for presumed intra-abdominal infection  -GI consult in AM for ERCP w/ biopsy -F/u CA 19-9 -Daily CMP to trend LFTs Paroxysmal A-fib    (CMD) -Hx of A-fib, on Eliquis  and propafenone   Plan: -Hold Eliquis  in anticipation of upcoming procedure  -Continue propafenone   -Telemetry monitoring  Prolonged QT interval -Noted to have Qtc 514 ms on EKG at OSH Plan: -F/u repeat EKG -Avoid QT prolonging medication  -Monitor and replete electrolytes as needed  Low back pain -Hx of low back pain with sciatica, recently started on gabapentin  but per family this might have been contributing to weakness/confusion  Plan: -Hold gabapentin   -Toradol  and oxy 5 mg for low back pain  DVT Prophylaxis: Fully Anticoagulated  CODE STATUS: Full Code. Information obtained from patient  Could Greg Mann be considered for Hospital at [facility] in the next 24 hours? No   Anticipate > 2 midnight stay, patient will be admitted as inpatient for Biliary obstruction (CMD)Estimated date of discharge Aug 03, 2024    Damien Aleck Sprang, PA-C        [1] No past medical history on file. [2] No past surgical history on file. [3] No family history on file. [4]   Social Drivers of Health   Food Insecurity: No Food Insecurity (07/31/2024)   Received from [facility]   Food vital sign   . Within the past 12 months, you worried that your food would run out before you got money to buy more: Never true   . Within the past 12 months, the food you bought just didn't last and you didn't have money to get more: Never true  Transportation Needs: No Transportation Needs (07/31/2024)   Received from [facility] - Transportation   . In the past 12 months, has lack of transportation kept you from medical appointments or from getting medications?: No   . In the past 12 months, has lack of transportation kept you from meetings, work, or from getting things needed  for daily living?: No  Social Connections: Unknown (07/31/2024)   Received from [facility]   Social Connection and Isolation Panel   . In a typical week, how many times do you talk on the phone with family, friends, or neighbors?: Twice a week   . How often do you get together with friends or relatives?: Patient declined   . How often do you attend church or religious services?: More than 4 times per year   . Do you belong to any clubs or organizations such as church groups, unions, fraternal or athletic groups, or school groups?: Yes   . How often do you attend meetings of the clubs or organizations you belong to?: More than 4 times per year   . Are you married, widowed, divorced, separated, never married, or living with a partner?: Married  Safety: Not At Risk (07/31/2024)   Received from [facility]   Safety   . Within the last year, have you been afraid of your partner or ex-partner?: No   . Within the last year, have you been humiliated or emotionally abused in other ways by your  partner or ex-partner?: No   . Within the last year, have you been kicked, hit, slapped, or otherwise physically hurt by your partner or ex-partner?: No   . Within the last year, have you been raped or forced to have any kind of sexual activity by your partner or ex-partner?: No  Living Situation: Low Risk  (07/31/2024)   Received from [facility] Situation   . In the last 12 months, was there a time when you were not able to pay the mortgage or rent on time?: No   . In the past 12 months, how many times have you moved where you were living?: 0   . At any time in the past 12 months, were you homeless or living in a shelter (including now)?: No  [5] No medications prior to admission.

## 2024-07-31 NOTE — Discharge Summary (Signed)
 Physician Discharge Summary   Patient: Greg Mann MRN: [id number] DOB: 25-Jun-1946  Admit date:     07/31/2024  Discharge date: 07/31/24  Discharge Physician: Elidia Sieving Jrue Yambao   PCP: Shona Norleen PEDLAR, MD   Recommendations at discharge:    Patient is being transferred to [facility] for further advance endoscopic diagnostics. Accepting physician Dr. Mai   Discharge Diagnoses: Principal Problem:   Acute metabolic encephalopathy Active Problems:   Biliary obstruction (HCC)   LFT elevation   Weakness  Resolved Problems:   * No resolved hospital problems. [facility] Course: Greg Mann was admitted to the hospital with the working diagnosis of acute metabolic encephalopathy, found to have a possible malignant obstruction in the pancreatic head.   78 yo male with the past medical history of paroxysmal atrial fibrillation, and chronic back pain who presented with several days of generalized weakness and confusion, different than his baseline. Had had recently prescribed gabapentin  (11/12) ten days prior to admission for back and sciatica. Since then his symptoms appeared, and have persisted despite reducing the dose and sometimes holding medication. Reported poor appetite and low po intake over the last 3 days prior to admission.  On his initial physical examination his blood pressure was 119/57, HR 66, RR 19 and 02 saturation 93%  Lungs with no wheezing or rhonchi, heart with S1 and S2 present and regular, abdomen soft and non tender, not distended, no masses, no lower extremity edema.   Na 138, K 4.2 Cl 105 bicarbonate 24 glucose 149 bun 19 cr 0,94 ALK P 468  total bilirubin 1.1  AST 259 ALT 325  Wbc 6,5 hgb 11.1 plt 169  INR 1,0  Urine analysis SG 1,012, negative protein, negative hgb and negative leukocytes.   Hepatitis panel negative   CT head with no acute intracranial abnormalities Atrophy and chronic small vessel disease throughout the deep white matter.  CT  chest abdomen and pelvis with intrahepatic and extrahepatic biliary ductal dilatation, with the common bile duct measuring up to 1,6 cm Pancreatic ductal dilatation measuring up to 9 mm, no visible pancreatic head mass.  Fatty liver.   Abdominal US  with distended gallbladder with dilatation of the common bile duct and main pancreatic duct.   MRCP with marked dilatation of the common bile duct and pancreatic duct with abrupt termination in the pancreatic head, highly suggestive of malignant obstruction in the pancreatic head.  No evidence of hepatic metastasis.  No periportal or peripancreatic lymphadenopathy identified.  No clear vascular involvement as the lesion is not readily identified.  Chest radiograph with no infiltrates or effusions, no cardiomegaly.   EKG 65 bpm, normal axis, qtc 514, sinus rhythm with PAC, no significant ST segment or  T wave changes.   Patient was placed on IV fluids and IV antibiotic therapy.  GI was consulted.  Decision was made to transfer to tertiary care for further advance endoscopic procedure.   Assessment and Plan: * Acute metabolic encephalopathy Multifactorial encephalopathy, clinically has resolved with supportive medical therapy. His wife at the bedside and confirms this has resolved and he is back to his baseline.  Discontinued gabapentin    Pancreatic mass Patient will be transferred to tertiary care for further advance endoscopic procedures.   Paroxysmal atrial fibrillation (HCC) Continue with propafenone  and anticoagulation with apixaban .  Currently patient on sinus rhythm.         Consultants: GI  Procedures performed: none   Disposition: tertiary care  Diet recommendation:  Cardiac diet  DISCHARGE MEDICATION: Allergies as of 07/31/2024   No Known Allergies      Medication List     STOP taking these medications    Durezol 0.05 % Emul Generic drug: Difluprednate   Neurontin  300 MG capsule Generic drug: gabapentin     sildenafil 100 MG tablet Commonly known as: VIAGRA       TAKE these medications    Bromfenac Sodium 0.07 % Soln Apply 1 drop to eye See admin instructions. *Starting 3 days prior to surgery, instill 1 drop into operative eye twice daily as directed.   Eliquis  5 MG Tabs tablet Generic drug: apixaban  TAKE 1 TABLET BY MOUTH TWICE A DAY   gatifloxacin 0.5 % Soln Commonly known as: ZYMAXID 1 drop See admin instructions. Instill one drop 4 times daily in operative eye starting 3 days before surgery   multivitamin tablet Take 1 tablet by mouth daily.   prednisoLONE acetate 1 % ophthalmic suspension Commonly known as: PRED FORTE 1 drop See admin instructions. Instill one drop 4 times daily into operative eye starting 3 days before surgery   propafenone  225 MG 12 hr capsule Commonly known as: RYTHMOL  SR Take 1 capsule (225 mg total) by mouth 2 (two) times daily.   VITAMIN D3 EXTRA STRENGTH PO Take 2 Capfuls by mouth daily. 2 capfuls absorbed in 4 ounces of water  daily        Discharge Exam: Filed Weights   07/31/24 0100  Weight: 77.5 kg   BP (!) 144/81 (BP Location: Right Arm)   Pulse (!) 58   Temp 97.6 F (36.4 C) (Oral)   Resp 17   Ht 5' 11 (1.803 m)   Wt 77.5 kg   SpO2 99%   BMI 23.83 kg/m   Patient with no chest pain or dyspnea, no nausea or vomiting, his mentation is back to baseline.   Neurology awake and alert ENT with mild pallor with no icterus, Cardiovascular with S1 and S2 present and regular with no gallops, rubs or murmurs Respiratory with no rales or wheezing, no rhonchi  Abdomen soft and non tender, not distended No lower extremity edema     Condition at discharge: stable  The results of significant diagnostics from this hospitalization (including imaging, microbiology, ancillary and laboratory) are listed below for reference.   Imaging Studies: US  Abdomen Limited Result Date: 07/31/2024 CLINICAL DATA:  Pancreatic cancer.  Elevated liver  enzymes. EXAM: ULTRASOUND ABDOMEN LIMITED RIGHT UPPER QUADRANT COMPARISON:  Abdominal MRI dated 07/31/2024. FINDINGS: Gallbladder: The gallbladder is distended. There is sludge within the gallbladder. No shadowing stone. No gallbladder wall thickening or pericholecystic fluid. Negative sonographic Murphy's sign. Common bile duct: Diameter: 16 mm. There is dilatation of the biliary tree and pancreatic duct. Liver: There is diffuse increased liver echogenicity most commonly seen in the setting of fatty infiltration. Superimposed inflammation or fibrosis is not excluded. Clinical correlation is recommended. Portal vein is patent on color Doppler imaging with normal direction of blood flow towards the liver. Other: None. IMPRESSION: 1. Distended gallbladder with dilatation of the common bile duct and main pancreatic duct. These are better evaluated on the recent MRI. No calcified gallstone. 2. Fatty liver. Electronically Signed   By: Vanetta Chou M.D.   On: 07/31/2024 11:24   MR ABDOMEN MRCP W WO CONTAST Result Date: 07/31/2024 EXAM: MRCP WITH AND WITHOUT IV CONTRAST 07/31/2024 10:59:15 AM TECHNIQUE: Multisequence, multiplanar magnetic resonance images of the abdomen with and without intravenous contrast. MRCP sequences were performed. COMPARISON: CT 07/31/2024. CLINICAL  HISTORY: Pancreatic cancer, monitor; ?pancreatic mass. FINDINGS: LIVER: Moderate intrahepatic biliary ductal dilatation within the liver. No enhancing lesion within the liver suggests metastatic disease. GALLBLADDER AND BILIARY SYSTEM: Gallbladder is unremarkable. Marked dilatation of the common hepatic duct to 15 mm and dilatation of the common bile duct to 12 mm. Ductal dilatation ends appropriately in the pancreatic head. The common bile duct obstruction is at the level of the pancreatic head. Marked dilatation of the common bile duct with abrupt termination in the pancreatic head is highly suggestive of malignant obstruction in the  pancreatic head. SPLEEN: Unremarkable. PANCREAS/PANCREATIC DUCT: There is severe dilatation of the pancreatic duct measuring 8 mm in diameter. The pancreatic ductal dilatation also ends abruptly in the pancreatic head at the same level of the common bile duct obstruction. There is no discrete lesion identified within the pancreatic head on T2 weighted imaging, T1 weighted imaging, or postcontrast weighted imaging but presumably there is an obstructing lesion in the pancreatic head. Severe dilatation of the pancreatic duct with abrupt termination in the pancreatic head is highly suggestive of malignant obstruction in the pancreatic head. ADRENAL GLANDS: Unremarkable. KIDNEYS: Unremarkable. LYMPH NODES: No periportal or peripancreatic lymphadenopathy identified. No metastatic adenopathy identified. VASCULATURE: No clear vascular involvement as the lesion is not readily identified. PERITONEUM: No ascites. ABDOMINAL WALL: No hernia. No mass. BOWEL: Grossly unremarkable. No bowel obstruction. BONES: No acute abnormality or worrisome osseous lesion. SOFT TISSUES: Unremarkable. MISCELLANEOUS: Unremarkable. IMPRESSION: 1. Marked dilatation of the common bile duct and pancreatic duct with abrupt termination in the pancreatic head is highly suggestive of malignant obstruction in the pancreatic head. 2. Recommend correlation with serum tumor markers and recommend endoscopic ultrasound for evaluation and tissue sampling. 3. No evidence of hepatic metastasis. 4. No periportal or peripancreatic lymphadenopathy identified. 5. No clear vascular involvement as the lesion is not readily identified. Electronically signed by: Norleen Boxer MD 07/31/2024 11:23 AM EST RP Workstation: [id number]   MR 3D Recon At Scanner Result Date: 07/31/2024 EXAM: MRCP WITH AND WITHOUT IV CONTRAST 07/31/2024 10:59:15 AM TECHNIQUE: Multisequence, multiplanar magnetic resonance images of the abdomen with and without intravenous contrast. MRCP sequences  were performed. COMPARISON: CT 07/31/2024. CLINICAL HISTORY: Pancreatic cancer, monitor; ?pancreatic mass. FINDINGS: LIVER: Moderate intrahepatic biliary ductal dilatation within the liver. No enhancing lesion within the liver suggests metastatic disease. GALLBLADDER AND BILIARY SYSTEM: Gallbladder is unremarkable. Marked dilatation of the common hepatic duct to 15 mm and dilatation of the common bile duct to 12 mm. Ductal dilatation ends appropriately in the pancreatic head. The common bile duct obstruction is at the level of the pancreatic head. Marked dilatation of the common bile duct with abrupt termination in the pancreatic head is highly suggestive of malignant obstruction in the pancreatic head. SPLEEN: Unremarkable. PANCREAS/PANCREATIC DUCT: There is severe dilatation of the pancreatic duct measuring 8 mm in diameter. The pancreatic ductal dilatation also ends abruptly in the pancreatic head at the same level of the common bile duct obstruction. There is no discrete lesion identified within the pancreatic head on T2 weighted imaging, T1 weighted imaging, or postcontrast weighted imaging but presumably there is an obstructing lesion in the pancreatic head. Severe dilatation of the pancreatic duct with abrupt termination in the pancreatic head is highly suggestive of malignant obstruction in the pancreatic head. ADRENAL GLANDS: Unremarkable. KIDNEYS: Unremarkable. LYMPH NODES: No periportal or peripancreatic lymphadenopathy identified. No metastatic adenopathy identified. VASCULATURE: No clear vascular involvement as the lesion is not readily identified. PERITONEUM: No ascites. ABDOMINAL WALL: No hernia.  No mass. BOWEL: Grossly unremarkable. No bowel obstruction. BONES: No acute abnormality or worrisome osseous lesion. SOFT TISSUES: Unremarkable. MISCELLANEOUS: Unremarkable. IMPRESSION: 1. Marked dilatation of the common bile duct and pancreatic duct with abrupt termination in the pancreatic head is highly  suggestive of malignant obstruction in the pancreatic head. 2. Recommend correlation with serum tumor markers and recommend endoscopic ultrasound for evaluation and tissue sampling. 3. No evidence of hepatic metastasis. 4. No periportal or peripancreatic lymphadenopathy identified. 5. No clear vascular involvement as the lesion is not readily identified. Electronically signed by: Norleen Boxer MD 07/31/2024 11:23 AM EST RP Workstation: [id number]   CT HEAD WO CONTRAST ( ) Result Date: 07/31/2024 EXAM: CT HEAD WITHOUT CONTRAST 07/31/2024 02:47:07 AM TECHNIQUE: CT of the head was performed without the administration of intravenous contrast. Automated exposure control, iterative reconstruction, and/or weight based adjustment of the mA/kV was utilized to reduce the radiation dose to as low as reasonably achievable. COMPARISON: None available. CLINICAL HISTORY: Delirium FINDINGS: BRAIN AND VENTRICLES: No acute hemorrhage. No evidence of acute infarct. No hydrocephalus. No extra-axial collection. No mass effect or midline shift. There is atrophy and chronic small vessel disease throughout the deep white matter. ORBITS: No acute abnormality. SINUSES: No acute abnormality. SOFT TISSUES AND SKULL: No acute soft tissue abnormality. No skull fracture. IMPRESSION: 1. No acute intracranial abnormality. 2. Atrophy and chronic small vessel disease throughout the deep white matter, which may contribute to cognitive dysfunction or delirium. Electronically signed by: Franky Crease MD 07/31/2024 03:02 AM EST RP Workstation: [id number]   CT CHEST ABDOMEN PELVIS W CONTRAST Result Date: 07/31/2024 EXAM: CT CHEST, ABDOMEN AND PELVIS WITH CONTRAST 07/31/2024 02:47:07 AM TECHNIQUE: CT of the chest, abdomen and pelvis was performed with the administration of intravenous contrast. 100 mL of iohexol  (OMNIPAQUE ) 300 MG/ML solution was administered. Multiplanar reformatted images are provided for review. Automated exposure control,  iterative reconstruction, and/or weight based adjustment of the mA/kV was utilized to reduce the radiation dose to as low as reasonably achievable. COMPARISON: None available. CLINICAL HISTORY: Sepsis; confusion and LFT elevation and ?lower lobe pna. FINDINGS: CHEST: MEDIASTINUM AND LYMPH NODES: Cardiomegaly. Scattered coronary artery and aortic atherosclerosis. The central airways are clear. No mediastinal, hilar or axillary lymphadenopathy. LUNGS AND PLEURA: Bibasilar dependent opacities most compatible with atelectasis. No pulmonary edema. No pleural effusion or pneumothorax. ABDOMEN AND PELVIS: LIVER: Diffuse low density throughout the liver is compatible with fatty infiltration. GALLBLADDER AND BILE DUCTS: Intrahepatic and extrahepatic biliary ductal dilatation. Common bile duct measures up to 1.6 cm. SPLEEN: No acute abnormality. PANCREAS: Pancreatic ductal dilatation measuring up to 9 mm. No visible pancreatic head mass, but given the pancreatic ductal and biliary dilatation, recommend MRI to exclude pancreatic head mass. ADRENAL GLANDS: No acute abnormality. KIDNEYS, URETERS AND BLADDER: No stones in the kidneys or ureters. No hydronephrosis. No perinephric or periureteral stranding. Urinary bladder is unremarkable. GI AND BOWEL: Stomach demonstrates no acute abnormality. Large stool burden throughout the colon. There is no bowel obstruction. REPRODUCTIVE ORGANS: No acute abnormality. PERITONEUM AND RETROPERITONEUM: No ascites. No free air. VASCULATURE: Aorta is normal in caliber. ABDOMINAL AND PELVIS LYMPH NODES: No lymphadenopathy. REPRODUCTIVE ORGANS: No acute abnormality. BONES AND SOFT TISSUES: No acute osseous abnormality. No focal soft tissue abnormality. IMPRESSION: 1. Intrahepatic and extrahepatic biliary ductal dilatation, with the common bile duct measuring up to 1.6 cm. 2. Pancreatic ductal dilatation measuring up to 9 mm. No visible pancreatic head mass; given the pancreatic ductal and biliary  dilatation, recommend MRI to exclude a  pancreatic head mass. 3. Bibasilar dependent opacities most compatible with atelectasis. 4. Fatty liver Electronically signed by: Franky Crease MD 07/31/2024 03:01 AM EST RP Workstation: [id number]   DG Chest Port 1 View Result Date: 07/31/2024 EXAM: 1 VIEW(S) XRAY OF THE CHEST 07/31/2024 01:28:26 AM COMPARISON: 04/23/2022 CLINICAL HISTORY: ams FINDINGS: LUNGS AND PLEURA: Minimal left basilar subsegmental atelectasis. No pleural effusion. No pneumothorax. HEART AND MEDIASTINUM: Slightly prominent heart size, likely accentuated by technique. BONES AND SOFT TISSUES: No acute osseous abnormality. IMPRESSION: 1. Mild left basilar atelectasis. Electronically signed by: Oneil Devonshire MD 07/31/2024 01:35 AM EST RP Workstation: [id number]    Microbiology: Results for orders placed or performed during the hospital encounter of 07/31/24  Culture, blood (Routine X 2) w Reflex to ID Panel     Status: None (Preliminary result)   Collection Time: 07/31/24  3:50 AM   Specimen: BLOOD  Result Value Ref Range Status   Specimen Description BLOOD LEFT ANTECUBITAL  Final   Special Requests   Final    BOTTLES DRAWN AEROBIC AND ANAEROBIC Blood Culture results may not be optimal due to an inadequate volume of blood received in culture bottles   Culture   Final    NO GROWTH < 12 HOURS Performed at [facility], [address], [place], [place] [address]    Report Status PENDING  Incomplete  Culture, blood (Routine X 2) w Reflex to ID Panel     Status: None (Preliminary result)   Collection Time: 07/31/24  3:50 AM   Specimen: BLOOD  Result Value Ref Range Status   Specimen Description BLOOD RIGHT ANTECUBITAL  Final   Special Requests   Final    BOTTLES DRAWN AEROBIC AND ANAEROBIC Blood Culture results may not be optimal due to an inadequate volume of blood received in culture bottles   Culture   Final    NO GROWTH < 12 HOURS Performed at [facility], [address]  [place], [place] [address]    Report Status PENDING  Incomplete    Labs: CBC: Recent Labs  Lab 07/31/24 0131  WBC 6.5  HGB 11.1*  HCT 33.7*  MCV 97.1  PLT 169   Basic Metabolic Panel: Recent Labs  Lab 07/31/24 0131  NA 138  K 4.2  CL 105  CO2 24  GLUCOSE 149*  BUN 19  CREATININE 0.94  CALCIUM 8.7*  MG 2.1  PHOS 2.7   Liver Function Tests: Recent Labs  Lab 07/31/24 0131  AST 259*  ALT 325*  ALKPHOS 468*  BILITOT 1.1  PROT 6.3*  ALBUMIN 3.8   CBG: No results for input(s): GLUCAP in the last 168 hours.  Discharge time spent: greater than 30 minutes.  Signed: Elidia Toribio Furnace, MD [facility] 07/31/2024

## 2024-08-01 NOTE — Progress Notes (Signed)
 Case Management Screening  CSN: [id number] DOB: 05-05-1946 Service: General Medicine Location: R807/A   Initial Screening Readmission Risk Score v2: 8.1 Risk Level: Low - Patient does not meet high risk criteria for post hospital services. [employer]er, Operational will be available to assist as indicated.  Patient with history of PAF on eliquis  and propafenone , low back pain who presented to the emergency department due to several days of generalized weakness and confusion. CM will continue to follow for care coordination.     Harlene LITTIE Barters, MSW Ph: [phone]

## 2024-08-05 LAB — CULTURE, BLOOD (ROUTINE X 2)
Culture: NO GROWTH
Culture: NO GROWTH

## 2024-08-11 DIAGNOSIS — C801 Malignant (primary) neoplasm, unspecified: Secondary | ICD-10-CM | POA: Diagnosis not present

## 2024-08-11 DIAGNOSIS — C250 Malignant neoplasm of head of pancreas: Secondary | ICD-10-CM | POA: Diagnosis not present

## 2024-08-11 DIAGNOSIS — I480 Paroxysmal atrial fibrillation: Secondary | ICD-10-CM | POA: Diagnosis not present

## 2024-08-11 DIAGNOSIS — K8689 Other specified diseases of pancreas: Secondary | ICD-10-CM | POA: Diagnosis not present

## 2024-08-11 DIAGNOSIS — K831 Obstruction of bile duct: Secondary | ICD-10-CM | POA: Diagnosis not present

## 2024-08-11 DIAGNOSIS — Z4659 Encounter for fitting and adjustment of other gastrointestinal appliance and device: Secondary | ICD-10-CM | POA: Diagnosis not present

## 2024-08-31 NOTE — Telephone Encounter (Signed)
 Medication Access Center Summary  Medication Ondansetron  8mg   PA Status Approved  Approval Dates 08/31/24 to 09/08/24  Authorization # [id number]     '

## 2024-09-06 NOTE — Progress Notes (Signed)
 Patient tolerated the infusions well without complications.  Patient ambulatory alert and oriented x 3 at time of discharge.

## 2024-09-14 ENCOUNTER — Other Ambulatory Visit: Payer: Self-pay | Admitting: Oncology

## 2024-09-14 ENCOUNTER — Inpatient Hospital Stay: Attending: Oncology | Admitting: Oncology

## 2024-09-14 VITALS — BP 108/90 | HR 98 | Temp 97.8°F | Resp 18 | Ht 71.0 in | Wt 165.8 lb

## 2024-09-14 DIAGNOSIS — Z5111 Encounter for antineoplastic chemotherapy: Secondary | ICD-10-CM | POA: Insufficient documentation

## 2024-09-14 DIAGNOSIS — C250 Malignant neoplasm of head of pancreas: Secondary | ICD-10-CM | POA: Diagnosis not present

## 2024-09-14 DIAGNOSIS — I2699 Other pulmonary embolism without acute cor pulmonale: Secondary | ICD-10-CM | POA: Diagnosis not present

## 2024-09-14 DIAGNOSIS — Z7901 Long term (current) use of anticoagulants: Secondary | ICD-10-CM | POA: Diagnosis not present

## 2024-09-14 DIAGNOSIS — R609 Edema, unspecified: Secondary | ICD-10-CM | POA: Diagnosis not present

## 2024-09-14 DIAGNOSIS — C259 Malignant neoplasm of pancreas, unspecified: Secondary | ICD-10-CM | POA: Insufficient documentation

## 2024-09-14 NOTE — Progress Notes (Signed)
 START ON PATHWAY REGIMEN - Pancreatic Adenocarcinoma     A cycle is every 28 days:     Nab-paclitaxel (protein bound)      Gemcitabine   **Always confirm dose/schedule in your pharmacy ordering system**  Patient Characteristics: Preoperative, M0 (Clinical Staging), Resectable, Neoadjuvant Therapy Planned, BRCA1/2 and PALB2 Mutation Absent/Unknown Therapeutic Status: Preoperative, M0 (Clinical Staging) AJCC T Category: cT2 AJCC N Category: cN0 AJCC M Category: cM0 AJCC 8 Stage Grouping: IB BRCA1/2 Mutation Status: Awaiting Test Results PALB2 Mutation Status: Awaiting Test Results Intent of Therapy: Curative Intent, Discussed with Patient

## 2024-09-14 NOTE — Patient Instructions (Addendum)
 [facility]  Discharge Instructions  You were seen and examined today by Dr. Davonna. Dr. Davonna is a medical oncologist, meaning that she specializes in the treatment of cancer diagnoses. Dr. Davonna discussed your past medical history, family history of cancers, and the events that led to you being here today.  You were seen by Dr. Davonna for ongoing management of your pancreatic cancer.  You will continue treatment that was started at [facility], which is every other week.  Follow-up as scheduled.  Thank you for choosing  [facility] to provide your oncology and hematology care.   To afford each patient quality time with our provider, please arrive at least 15 minutes before your scheduled appointment time. You may need to reschedule your appointment if you arrive late (10 or more minutes). Arriving late affects you and other patients whose appointments are after yours.  Also, if you miss three or more appointments without notifying the office, you may be dismissed from the clinic at the providers discretion.    Again, thank you for choosing [facility].  Our hope is that these requests will decrease the amount of time that you wait before being seen by our physicians.   If you have a lab appointment with the Cancer Center - please note that after April 8th, all labs will be drawn in the cancer center.  You do not have to check in or register with the main entrance as you have in the past but will complete your check-in at the cancer center.            _____________________________________________________________  Should you have questions after your visit to [facility], please contact our office at [phone] and follow the prompts.  Our office hours are 8:00 a.m. to 4:30 p.m. Monday - Thursday and 8:00 a.m. to 2:30 p.m. Friday.  Please note that voicemails left after 4:00 p.m. may not be returned until  the following business day.  We are closed weekends and all major holidays.  You do have access to a nurse 24-7, just call the main number to the clinic [phone] and do not press any options, hold on the line and a nurse will answer the phone.    For prescription refill requests, have your pharmacy contact our office and allow 72 hours.    Masks are no longer required in the cancer centers. If you would like for your care team to wear a mask while they are taking care of you, please let them know. You may have one support person who is at least 80 years old accompany you for your appointments.

## 2024-09-14 NOTE — Progress Notes (Signed)
 "  Hematology-Oncology Clinic Note  Shona Norleen PEDLAR, MD   Reason for Referral: Pancreatic adenocarcinoma  Oncology History: I have reviewed his chart and materials related to his cancer extensively and collaborated history with the patient. Summary of oncologic history is as follows:  Diagnosis: Stage I Pancreatic adenocarcinoma  -07/31/2024: CA 19-9: 55. ALP 362. AST 112. ALT 215. Calcium 8.5. Total Protein 5.4. -07/31/2024: CT CAP: Intrahepatic and extrahepatic biliary ductal dilatation, with the common bile duct measuring up to 1.6 cm. Pancreatic ductal dilatation measuring up to 9 mm. No visible pancreatic head mass; given the pancreatic ductal and biliary dilatation, recommend MRI to exclude a pancreatic head mass. -07/31/2024: MRI abdomen: Marked dilatation of the common bile duct and pancreatic duct with abrupt termination in the pancreatic head is highly suggestive of malignant obstruction in the pancreatic head. Recommend correlation with serum tumor markers and recommend endoscopic ultrasound for evaluation and tissue sampling. No evidence of hepatic metastasis. No periportal or peripancreatic lymphadenopathy identified. No clear vascular involvement as the lesion is not readily identified. -08/11/2024: EUS with pancreatic mass biopsy.   - Mass measuring 30 mm x 25 mm was visualized in the pancreas. The irregular and hypoechoic NX mass with poorly defined margins was visualized  Pathology: Adenocarcinoma. Smears shows atypical cells arranged in sheets and singly. The atypical cells demonstrate anisonucleosis, irregular nuclear contours, and coarse chromatin.  -08/20/2024: CEA 2.3 (normal) -08/20/2024: CT CAP: Interval biliary sphincterotomy with placement of a metal CBD stent with pancreatic ductal dilatation measuring upwards to 9 mm. No visible pancreatic head mass given CT limitations. Incidentally noted right lower lobe subsegmental pulmonary embolism. Similar to slight increase of a  peripancreatic lymph node measuring 1.1 cm. 5 mm right middle lobe nodule.  -08/24/2024: Patient evaluated by medical oncology at [facility] who recommended neoadjuvant gemcitabine/ nab-paclitaxel therapy. He was felt not to be a surgical candidate at the time due to a pulmonary embolism, though tentatively patient may be eligible for resection 4 to 6 months after treatment.  -08/27/2024: Port placed -08/27/2024- Current: Gemcitabine and Abraxane every 2 weeks -08/31/2024: [facility] NGS: TMB 4 mt/Mb, MSI Stable, LOH Low 6%     -Mutated, Pathogenic CDKN1B (p.R19fs) [45%], KMT2C (p.D2521fs) [34%], KRAS (p.G12D) [49%] , TP53 (p.R213*) [50%]      - Deletion MTAP  -09/06/2024: CA 19-9: 40   History of Presenting Illness: Greg Mann is a 79 y.o. male presenting to the clinic today for pancreatic adenocarcinoma referred by Darrelyn Ozell Pac, MD. Patient is accompanied by his son.  Shahin was admitted to the hospital at [facility] from 07/31/2024 to 08/01/2024 for a biliary obstruction with associated symptoms of elevated LFTs, weakness, and abdominal swelling. MRCP showed a likely malignant obstruction at the pancreatic head and CA 19-9 was elevated at 55. An EUS with biopsy of the pancreatic mass was done on 08/11/2024 which showed adenocarcinoma. Though standard of care is resection of the mass, patient was not felt to be candidate at the time of diagnosis due to a pulmonary embolism and was recommended neoadjuvant chemotherapy prior to potential surgery. After initial consultation with medical oncology at [facility], Shylo wished to receive treatment locally and was referred to me.  Farouk had his first cycle on 09/06/2024 at [facility] and is scheduled for his second cycle on 09/21/2024, which will be done at [facility]. He denies any abdominal pain, nausea, vomiting, numbness, or tingling due to chemotherapy. He states he tolerated treatment well.   Kali had his port  placed, though he has not  received EMLA cream, and notes some skin irritation to the area after treatment. I will prescribe EMLA cream.   Antonius is taking Eliquis  for his PE and notes swelling in the bilateral lower extremities, worsened on the left. He had a US  of the LLE which was negative for a DVT. I will prescribe Lasix to improve swelling. He denies any melena or blood in stools.   He has a CT AP scheduled for 08/25/2024 at [facility], as well as follow-up appointments with his oncologist and surgeon, which he will keep. Alecxis plans to maintain care at [facility], while receiving treatment at [facility]. He has not been seen by genetic counseling.   Tiago's son states the patient underwent cataract surgery on one eye prior to cancer diagnosis and starting Eliquis . Due to blurry vision, he reports feeling unbalanced and is frustrated with his decreased vision and its side effects. We discussed that completing cataracts surgery should be postponed for a few months with a risk of thrombosis with holding anticoagulation.   Patient lives in [place], [place] with his wife, who typically provides transportation for the patient. His wife recently had a hip injury and is unable to drive him to appointments. He has no family oncology history. He has no smoking history and occasionally drinks alcohol, though he has not had alcohol since starting Eliquis .   Medical History: Past Medical History:  Diagnosis Date   Borderline hyperlipidemia    Chest pain    Dysrhythmia    Exercise-induced tachycardia    Ganglion    left wrist   GERD (gastroesophageal reflux disease)    Thrombocytopenia    borderline    Surgical history: Past Surgical History:  Procedure Laterality Date   cataracts     COLONOSCOPY N/A 04/22/2018   Procedure: COLONOSCOPY;  Surgeon: Shaaron Lamar HERO, MD;  Location: [facility];  Service: Endoscopy;  Laterality: N/A;  8:30   GANGLION CYST EXCISION     Left Wrist   KNEE ARTHROSCOPY     Left   TONSILLECTOMY        Allergies:  has no known allergies.  Medications:  Current Outpatient Medications  Medication Sig Dispense Refill   apixaban  (ELIQUIS ) 5 MG TABS tablet TAKE 1 TABLET BY MOUTH TWICE A DAY 60 tablet 5   propafenone  (RYTHMOL  SR) 225 MG 12 hr capsule Take 1 capsule (225 mg total) by mouth 2 (two) times daily. 180 capsule 3   traMADol  (ULTRAM ) 50 MG tablet Take 50 mg by mouth every 6 (six) hours as needed.     Bromfenac Sodium 0.07 % SOLN Apply 1 drop to eye See admin instructions. *Starting 3 days prior to surgery, instill 1 drop into operative eye twice daily as directed. (Patient not taking: Reported on 09/14/2024)     gatifloxacin (ZYMAXID) 0.5 % SOLN 1 drop See admin instructions. Instill one drop 4 times daily in operative eye starting 3 days before surgery (Patient not taking: Reported on 09/14/2024)     prednisoLONE acetate (PRED FORTE) 1 % ophthalmic suspension 1 drop See admin instructions. Instill one drop 4 times daily into operative eye starting 3 days before surgery (Patient not taking: Reported on 09/14/2024)     No current facility-administered medications for this visit.     Physical Examination: ECOG PERFORMANCE STATUS: 1 - Symptomatic but completely ambulatory  Vitals:   09/14/24 0817  BP: (!) 108/90  Pulse: 98  Resp: 18  Temp: 97.8 F (36.6 C)  SpO2:  100%   Filed Weights   09/14/24 0817  Weight: 165 lb 12.8 oz (75.2 kg)    GENERAL:alert, no distress and comfortable LUNGS: clear to auscultation and percussion with normal breathing effort HEART: regular rate & rhythm and no murmurs and B/L pitting pedal edema extending upto knee left> right ABDOMEN:abdomen soft, non-tender and normal bowel sounds Musculoskeletal:no cyanosis of digits and no clubbing  PSYCH: alert & oriented x 3 with fluent speech NEURO: no focal motor/sensory deficits   Laboratory Data: I have reviewed the data as listed Lab Results  Component Value Date   WBC 6.5 07/31/2024   HGB 11.1  (L) 07/31/2024   HCT 33.7 (L) 07/31/2024   MCV 97.1 07/31/2024   PLT 169 07/31/2024      Chemistry      Component Value Date/Time   NA 138 07/31/2024 0131   K 4.2 07/31/2024 0131   CL 105 07/31/2024 0131   CO2 24 07/31/2024 0131   BUN 19 07/31/2024 0131   CREATININE 0.94 07/31/2024 0131   CREATININE 0.98 07/15/2013 1448      Component Value Date/Time   CALCIUM 8.7 (L) 07/31/2024 0131   ALKPHOS 468 (H) 07/31/2024 0131   AST 259 (H) 07/31/2024 0131   ALT 325 (H) 07/31/2024 0131   BILITOT 1.1 07/31/2024 0131       Radiographic Studies: I have personally reviewed the radiological images as listed and agreed with the findings in the report.  US  Abdomen Limited CLINICAL DATA:  Pancreatic cancer.  Elevated liver enzymes.  EXAM: ULTRASOUND ABDOMEN LIMITED RIGHT UPPER QUADRANT  COMPARISON:  Abdominal MRI dated 07/31/2024.  FINDINGS: Gallbladder:  The gallbladder is distended. There is sludge within the gallbladder. No shadowing stone. No gallbladder wall thickening or pericholecystic fluid. Negative sonographic Murphy's sign.  Common bile duct:  Diameter: 16 mm. There is dilatation of the biliary tree and pancreatic duct.  Liver:  There is diffuse increased liver echogenicity most commonly seen in the setting of fatty infiltration. Superimposed inflammation or fibrosis is not excluded. Clinical correlation is recommended. Portal vein is patent on color Doppler imaging with normal direction of blood flow towards the liver.  Other: None.  IMPRESSION: 1. Distended gallbladder with dilatation of the common bile duct and main pancreatic duct. These are better evaluated on the recent MRI. No calcified gallstone. 2. Fatty liver.  Electronically Signed   By: Vanetta Chou M.D.   On: 07/31/2024 11:24 MR 3D Recon At Scanner EXAM: MRCP WITH AND WITHOUT IV CONTRAST 07/31/2024 10:59:15 AM  TECHNIQUE: Multisequence, multiplanar magnetic resonance images of the  abdomen with and without intravenous contrast. MRCP sequences were performed.  COMPARISON: CT 07/31/2024.  CLINICAL HISTORY: Pancreatic cancer, monitor; ?pancreatic mass.  FINDINGS:  LIVER: Moderate intrahepatic biliary ductal dilatation within the liver. No enhancing lesion within the liver suggests metastatic disease.  GALLBLADDER AND BILIARY SYSTEM: Gallbladder is unremarkable. Marked dilatation of the common hepatic duct to 15 mm and dilatation of the common bile duct to 12 mm. Ductal dilatation ends appropriately in the pancreatic head. The common bile duct obstruction is at the level of the pancreatic head. Marked dilatation of the common bile duct with abrupt termination in the pancreatic head is highly suggestive of malignant obstruction in the pancreatic head.  SPLEEN: Unremarkable.  PANCREAS/PANCREATIC DUCT: There is severe dilatation of the pancreatic duct measuring 8 mm in diameter. The pancreatic ductal dilatation also ends abruptly in the pancreatic head at the same level of the common bile duct obstruction. There  is no discrete lesion identified within the pancreatic head on T2 weighted imaging, T1 weighted imaging, or postcontrast weighted imaging but presumably there is an obstructing lesion in the pancreatic head. Severe dilatation of the pancreatic duct with abrupt termination in the pancreatic head is highly suggestive of malignant obstruction in the pancreatic head.  ADRENAL GLANDS: Unremarkable.  KIDNEYS: Unremarkable.  LYMPH NODES: No periportal or peripancreatic lymphadenopathy identified. No metastatic adenopathy identified.  VASCULATURE: No clear vascular involvement as the lesion is not readily identified.  PERITONEUM: No ascites.  ABDOMINAL WALL: No hernia. No mass.  BOWEL: Grossly unremarkable. No bowel obstruction.  BONES: No acute abnormality or worrisome osseous lesion.  SOFT  TISSUES: Unremarkable.  MISCELLANEOUS: Unremarkable.  IMPRESSION: 1. Marked dilatation of the common bile duct and pancreatic duct with abrupt termination in the pancreatic head is highly suggestive of malignant obstruction in the pancreatic head. 2. Recommend correlation with serum tumor markers and recommend endoscopic ultrasound for evaluation and tissue sampling. 3. No evidence of hepatic metastasis. 4. No periportal or peripancreatic lymphadenopathy identified. 5. No clear vascular involvement as the lesion is not readily identified.  Electronically signed by: Norleen Boxer MD 07/31/2024 11:23 AM EST RP Workstation: [id number] MR ABDOMEN MRCP W WO CONTAST EXAM: MRCP WITH AND WITHOUT IV CONTRAST 07/31/2024 10:59:15 AM  TECHNIQUE: Multisequence, multiplanar magnetic resonance images of the abdomen with and without intravenous contrast. MRCP sequences were performed.  COMPARISON: CT 07/31/2024.  CLINICAL HISTORY: Pancreatic cancer, monitor; ?pancreatic mass.  FINDINGS:  LIVER: Moderate intrahepatic biliary ductal dilatation within the liver. No enhancing lesion within the liver suggests metastatic disease.  GALLBLADDER AND BILIARY SYSTEM: Gallbladder is unremarkable. Marked dilatation of the common hepatic duct to 15 mm and dilatation of the common bile duct to 12 mm. Ductal dilatation ends appropriately in the pancreatic head. The common bile duct obstruction is at the level of the pancreatic head. Marked dilatation of the common bile duct with abrupt termination in the pancreatic head is highly suggestive of malignant obstruction in the pancreatic head.  SPLEEN: Unremarkable.  PANCREAS/PANCREATIC DUCT: There is severe dilatation of the pancreatic duct measuring 8 mm in diameter. The pancreatic ductal dilatation also ends abruptly in the pancreatic head at the same level of the common bile duct obstruction. There is no discrete lesion identified within the  pancreatic head on T2 weighted imaging, T1 weighted imaging, or postcontrast weighted imaging but presumably there is an obstructing lesion in the pancreatic head. Severe dilatation of the pancreatic duct with abrupt termination in the pancreatic head is highly suggestive of malignant obstruction in the pancreatic head.  ADRENAL GLANDS: Unremarkable.  KIDNEYS: Unremarkable.  LYMPH NODES: No periportal or peripancreatic lymphadenopathy identified. No metastatic adenopathy identified.  VASCULATURE: No clear vascular involvement as the lesion is not readily identified.  PERITONEUM: No ascites.  ABDOMINAL WALL: No hernia. No mass.  BOWEL: Grossly unremarkable. No bowel obstruction.  BONES: No acute abnormality or worrisome osseous lesion.  SOFT TISSUES: Unremarkable.  MISCELLANEOUS: Unremarkable.  IMPRESSION: 1. Marked dilatation of the common bile duct and pancreatic duct with abrupt termination in the pancreatic head is highly suggestive of malignant obstruction in the pancreatic head. 2. Recommend correlation with serum tumor markers and recommend endoscopic ultrasound for evaluation and tissue sampling. 3. No evidence of hepatic metastasis. 4. No periportal or peripancreatic lymphadenopathy identified. 5. No clear vascular involvement as the lesion is not readily identified.  Electronically signed by: Norleen Boxer MD 07/31/2024 11:23 AM EST RP Workstation: [id number] CT HEAD WO CONTRAST (  ) EXAM: CT HEAD WITHOUT CONTRAST 07/31/2024 02:47:07 AM  TECHNIQUE: CT of the head was performed without the administration of intravenous contrast. Automated exposure control, iterative reconstruction, and/or weight based adjustment of the mA/kV was utilized to reduce the radiation dose to as low as reasonably achievable.  COMPARISON: None available.  CLINICAL HISTORY: Delirium  FINDINGS:  BRAIN AND VENTRICLES: No acute hemorrhage. No evidence of acute infarct.  No hydrocephalus. No extra-axial collection. No mass effect or midline shift. There is atrophy and chronic small vessel disease throughout the deep white matter.  ORBITS: No acute abnormality.  SINUSES: No acute abnormality.  SOFT TISSUES AND SKULL: No acute soft tissue abnormality. No skull fracture.  IMPRESSION: 1. No acute intracranial abnormality. 2. Atrophy and chronic small vessel disease throughout the deep white matter, which may contribute to cognitive dysfunction or delirium.  Electronically signed by: Franky Crease MD 07/31/2024 03:02 AM EST RP Workstation: [id number] CT CHEST ABDOMEN PELVIS W CONTRAST EXAM: CT CHEST, ABDOMEN AND PELVIS WITH CONTRAST 07/31/2024 02:47:07 AM  TECHNIQUE: CT of the chest, abdomen and pelvis was performed with the administration of intravenous contrast. 100 mL of iohexol  (OMNIPAQUE ) 300 MG/ML solution was administered. Multiplanar reformatted images are provided for review. Automated exposure control, iterative reconstruction, and/or weight based adjustment of the mA/kV was utilized to reduce the radiation dose to as low as reasonably achievable.  COMPARISON: None available.  CLINICAL HISTORY: Sepsis; confusion and LFT elevation and ?lower lobe pna.  FINDINGS:  CHEST:  MEDIASTINUM AND LYMPH NODES: Cardiomegaly. Scattered coronary artery and aortic atherosclerosis. The central airways are clear. No mediastinal, hilar or axillary lymphadenopathy.  LUNGS AND PLEURA: Bibasilar dependent opacities most compatible with atelectasis. No pulmonary edema. No pleural effusion or pneumothorax.  ABDOMEN AND PELVIS:  LIVER: Diffuse low density throughout the liver is compatible with fatty infiltration.  GALLBLADDER AND BILE DUCTS: Intrahepatic and extrahepatic biliary ductal dilatation. Common bile duct measures up to 1.6 cm.  SPLEEN: No acute abnormality.  PANCREAS: Pancreatic ductal dilatation measuring up to 9 mm. No visible  pancreatic head mass, but given the pancreatic ductal and biliary dilatation, recommend MRI to exclude pancreatic head mass.  ADRENAL GLANDS: No acute abnormality.  KIDNEYS, URETERS AND BLADDER: No stones in the kidneys or ureters. No hydronephrosis. No perinephric or periureteral stranding. Urinary bladder is unremarkable.  GI AND BOWEL: Stomach demonstrates no acute abnormality. Large stool burden throughout the colon. There is no bowel obstruction.  REPRODUCTIVE ORGANS: No acute abnormality.  PERITONEUM AND RETROPERITONEUM: No ascites. No free air.  VASCULATURE: Aorta is normal in caliber.  ABDOMINAL AND PELVIS LYMPH NODES: No lymphadenopathy.  REPRODUCTIVE ORGANS: No acute abnormality.  BONES AND SOFT TISSUES: No acute osseous abnormality. No focal soft tissue abnormality.  IMPRESSION: 1. Intrahepatic and extrahepatic biliary ductal dilatation, with the common bile duct measuring up to 1.6 cm. 2. Pancreatic ductal dilatation measuring up to 9 mm. No visible pancreatic head mass; given the pancreatic ductal and biliary dilatation, recommend MRI to exclude a pancreatic head mass. 3. Bibasilar dependent opacities most compatible with atelectasis. 4. Fatty liver  Electronically signed by: Franky Crease MD 07/31/2024 03:01 AM EST RP Workstation: [id number] DG Chest Port 1 View EXAM: 1 VIEW(S) XRAY OF THE CHEST 07/31/2024 01:28:26 AM  COMPARISON: 04/23/2022  CLINICAL HISTORY: ams  FINDINGS:  LUNGS AND PLEURA: Minimal left basilar subsegmental atelectasis. No pleural effusion. No pneumothorax.  HEART AND MEDIASTINUM: Slightly prominent heart size, likely accentuated by technique.  BONES AND SOFT TISSUES: No acute osseous abnormality.  IMPRESSION: 1. Mild left basilar atelectasis.  Electronically signed by: Oneil Devonshire MD 07/31/2024 01:35 AM EST RP Workstation: [id number]    ASSESSMENT & PLAN:  Patient is a 79 y.o. male presenting for transfer of  care from [facility] for pancreatic adenocarcinoma  Assessment and Plan  Pancreatic adenocarcinoma Stage I pancreatic adenocarcinoma Extensive oncology history above Upfront surgery not done as patient has a recent diagnosis of pulmonary embolism. Recommended neoadj chemotherapy Started on Gem/Abraxane 09/06/2024  -Will schedule infusion of Gem/abraxane here at [facility] on 09/21/2024 for his second cycle. Tolerated 1st cycle well with no significant side effects -Patient has a CT scan scheduled in April and follow up with Dr.Shen( surgery) after that - Will continue to follow with Dr.McCormick at [facility] - Continue Gem/Abraxane every 2 weeks - Will provide zofran  for nausea prevention and emla cream - Will follow up on caris results  Return to clinic with every other cycle to assess for tolerance  Pulmonary embolism - Continue Eliquis  5mg  twice daily    Pedal edema ECHO with LVEF 50-55% Left leg US : Negative for DVT  - Prescribed lasix 40mg  as needed daily   No orders of the defined types were placed in this encounter.   The total time spent in the appointment was 40 minutes encounter with patients including review of chart and various tests results, discussions about plan of care and coordination of care plan   All questions were answered. The patient knows to call the clinic with any problems, questions or concerns. No barriers to learning was detected.  Mickiel Dry, MD 1/6/20261:28 PM "

## 2024-09-15 ENCOUNTER — Other Ambulatory Visit: Payer: Self-pay

## 2024-09-20 NOTE — Progress Notes (Signed)
 Pharmacist Chemotherapy Monitoring - Initial Assessment    Anticipated start date: 09/21/24   The following has been reviewed per standard work regarding the patient's treatment regimen: The patient's diagnosis, treatment plan and drug doses, and organ/hematologic function Lab orders and baseline tests specific to treatment regimen  The treatment plan start date, drug sequencing, and pre-medications Prior authorization status  Patient's documented medication list, including drug-drug interaction screen and prescriptions for anti-emetics and supportive care specific to the treatment regimen The drug concentrations, fluid compatibility, administration routes, and timing of the medications to be used The patient's access for treatment and lifetime cumulative dose history, if applicable  The patient's medication allergies and previous infusion related reactions, if applicable   Changes made to treatment plan:  pre-medications - adjusted based on what patient was receiving at [facility] - Zofran  8 mg IV and dexamethasone  10 mg IV dc prochlorperazine .  This will be 2nd dose - 1st dose given at [facility] without issues.  Follow up needed:  N/A   Greg Mann, [facility], 09/20/2024  11:39 AM

## 2024-09-21 ENCOUNTER — Inpatient Hospital Stay

## 2024-09-21 VITALS — BP 170/78 | HR 55 | Temp 96.9°F | Resp 18

## 2024-09-21 VITALS — BP 135/75 | HR 57 | Temp 96.2°F | Resp 18 | Wt 168.9 lb

## 2024-09-21 DIAGNOSIS — Z5111 Encounter for antineoplastic chemotherapy: Secondary | ICD-10-CM | POA: Diagnosis not present

## 2024-09-21 DIAGNOSIS — C259 Malignant neoplasm of pancreas, unspecified: Secondary | ICD-10-CM

## 2024-09-21 LAB — COMPREHENSIVE METABOLIC PANEL WITH GFR
ALT: 18 U/L (ref 0–44)
AST: 20 U/L (ref 15–41)
Albumin: 4.1 g/dL (ref 3.5–5.0)
Alkaline Phosphatase: 103 U/L (ref 38–126)
Anion gap: 12 (ref 5–15)
BUN: 22 mg/dL (ref 8–23)
CO2: 22 mmol/L (ref 22–32)
Calcium: 8.9 mg/dL (ref 8.9–10.3)
Chloride: 107 mmol/L (ref 98–111)
Creatinine, Ser: 0.84 mg/dL (ref 0.61–1.24)
GFR, Estimated: >60 mL/min
Glucose, Bld: 108 mg/dL — ABNORMAL HIGH (ref 70–99)
Potassium: 4.1 mmol/L (ref 3.5–5.1)
Sodium: 141 mmol/L (ref 135–145)
Total Bilirubin: 0.4 mg/dL (ref 0.0–1.2)
Total Protein: 6.5 g/dL (ref 6.5–8.1)

## 2024-09-21 LAB — CBC WITH DIFFERENTIAL/PLATELET
Abs Immature Granulocytes: 0 K/uL (ref 0.00–0.07)
Basophils Absolute: 0 K/uL (ref 0.0–0.1)
Basophils Relative: 1 %
Eosinophils Absolute: 0.4 K/uL (ref 0.0–0.5)
Eosinophils Relative: 12 %
HCT: 34.2 % — ABNORMAL LOW (ref 39.0–52.0)
Hemoglobin: 10.9 g/dL — ABNORMAL LOW (ref 13.0–17.0)
Immature Granulocytes: 0 %
Lymphocytes Relative: 36 %
Lymphs Abs: 1.1 K/uL (ref 0.7–4.0)
MCH: 31.3 pg (ref 26.0–34.0)
MCHC: 31.9 g/dL (ref 30.0–36.0)
MCV: 98.3 fL (ref 80.0–100.0)
Monocytes Absolute: 0.4 K/uL (ref 0.1–1.0)
Monocytes Relative: 14 %
Neutro Abs: 1.1 K/uL — ABNORMAL LOW (ref 1.7–7.7)
Neutrophils Relative %: 37 %
Platelets: 187 K/uL (ref 150–400)
RBC: 3.48 MIL/uL — ABNORMAL LOW (ref 4.22–5.81)
RDW: 14.3 % (ref 11.5–15.5)
WBC: 3.1 K/uL — ABNORMAL LOW (ref 4.0–10.5)
nRBC: 0 % (ref 0.0–0.2)

## 2024-09-21 LAB — MAGNESIUM: Magnesium: 2.4 mg/dL (ref 1.7–2.4)

## 2024-09-21 MED ORDER — ONDANSETRON HCL 8 MG PO TABS: 8.0000 mg | ORAL_TABLET | Freq: Three times a day (TID) | ORAL | 1 refills | Status: AC | PRN

## 2024-09-21 MED ORDER — SODIUM CHLORIDE 0.9 % IV SOLN
1000.0000 mg/m2 | Freq: Once | INTRAVENOUS | Status: AC
  Administered 2024-09-21: 1938 mg via INTRAVENOUS
  Filled 2024-09-21: qty 50.97

## 2024-09-21 MED ORDER — PROCHLORPERAZINE MALEATE 10 MG PO TABS: 10.0000 mg | ORAL_TABLET | Freq: Four times a day (QID) | ORAL | 1 refills | Status: AC | PRN

## 2024-09-21 MED ORDER — DEXAMETHASONE SOD PHOSPHATE PF 10 MG/ML IJ SOLN
10.0000 mg | Freq: Once | INTRAMUSCULAR | Status: AC
  Administered 2024-09-21: 10 mg via INTRAVENOUS
  Filled 2024-09-21: qty 1

## 2024-09-21 MED ORDER — ONDANSETRON HCL 4 MG/2ML IJ SOLN
8.0000 mg | Freq: Once | INTRAMUSCULAR | Status: AC
  Administered 2024-09-21: 8 mg via INTRAVENOUS
  Filled 2024-09-21: qty 4

## 2024-09-21 MED ORDER — LIDOCAINE-PRILOCAINE 2.5-2.5 % EX CREA: TOPICAL_CREAM | CUTANEOUS | 3 refills | Status: DC

## 2024-09-21 MED ORDER — SODIUM CHLORIDE 0.9 % IV SOLN: INTRAVENOUS | Status: DC

## 2024-09-21 MED ORDER — PACLITAXEL PROTEIN-BOUND CHEMO INJECTION 100 MG
125.0000 mg/m2 | Freq: Once | INTRAVENOUS | Status: AC
  Administered 2024-09-21: 245 mg via INTRAVENOUS
  Filled 2024-09-21: qty 49

## 2024-09-21 NOTE — Progress Notes (Signed)
 Labs reviewed today. ANC is 1.1 today, ok to treat per MD.    1.  The right internal jugular vein is patent.  2.  The tip of the catheter is positioned in the superior cavoatrial junction. This was done at [facility]  Consent obtained for treatment today. Will review home medications and side effects of chemo at discharge.    Treatment given per orders. Patient tolerated it well without problems. Vitals stable and discharged home from clinic ambulatory. Follow up as scheduled.

## 2024-09-21 NOTE — Patient Instructions (Signed)
 [facility] [place] - A DEPT OF [facility]  Discharge Instructions: Thank you for choosing [facility] to provide your oncology and hematology care.  If you have a lab appointment with the Cancer Center - please note that after April 8th, 2024, all labs will be drawn in the cancer center.  You do not have to check in or register with the main entrance as you have in the past but will complete your check-in in the cancer center.  Wear comfortable clothing and clothing appropriate for easy access to any Portacath or PICC line.   We strive to give you quality time with your provider. You may need to reschedule your appointment if you arrive late (15 or more minutes).  Arriving late affects you and other patients whose appointments are after yours.  Also, if you miss three or more appointments without notifying the office, you may be dismissed from the clinic at the providers discretion.      For prescription refill requests, have your pharmacy contact our office and allow 72 hours for refills to be completed.    Today you received the following chemotherapy and/or immunotherapy agents abraxane , gemcitabine     To help prevent nausea and vomiting after your treatment, we encourage you to take your nausea medication as directed.  BELOW ARE SYMPTOMS THAT SHOULD BE REPORTED IMMEDIATELY: *FEVER GREATER THAN 100.4 F (38 C) OR HIGHER *CHILLS OR SWEATING *NAUSEA AND VOMITING THAT IS NOT CONTROLLED WITH YOUR NAUSEA MEDICATION *UNUSUAL SHORTNESS OF BREATH *UNUSUAL BRUISING OR BLEEDING *URINARY PROBLEMS (pain or burning when urinating, or frequent urination) *BOWEL PROBLEMS (unusual diarrhea, constipation, pain near the anus) TENDERNESS IN MOUTH AND THROAT WITH OR WITHOUT PRESENCE OF ULCERS (sore throat, sores in mouth, or a toothache) UNUSUAL RASH, SWELLING OR PAIN  UNUSUAL VAGINAL DISCHARGE OR ITCHING   Items with * indicate a potential emergency and should be  followed up as soon as possible or go to the Emergency Department if any problems should occur.  Please show the CHEMOTHERAPY ALERT CARD or IMMUNOTHERAPY ALERT CARD at check-in to the Emergency Department and triage nurse.  Should you have questions after your visit or need to cancel or reschedule your appointment, please contact [facility] [place] - A DEPT OF [facility] [phone]  and follow the prompts.  Office hours are 8:00 a.m. to 4:30 p.m. Monday - Friday. Please note that voicemails left after 4:00 p.m. may not be returned until the following business day.  We are closed weekends and major holidays. You have access to a nurse at all times for urgent questions. Please call the main number to the clinic [phone] and follow the prompts.  For any non-urgent questions, you may also contact your provider using MyChart. We now offer e-Visits for anyone 67 and older to request care online for non-urgent symptoms. For details visit [email or web address].   Also download the MyChart app! Go to the app store, search MyChart, open the app, select [place], and log in with your MyChart username and password.

## 2024-09-22 ENCOUNTER — Inpatient Hospital Stay: Admitting: Licensed Clinical Social Worker

## 2024-09-22 DIAGNOSIS — C259 Malignant neoplasm of pancreas, unspecified: Secondary | ICD-10-CM

## 2024-09-22 NOTE — Progress Notes (Signed)
 Navigator received call from Joesph, Oncology Nurse Navigator at [facility], sharing that patient's spouse was very confused and asking for clarification on follow up at [facility].  Per Dr. Rumaldo consult note, patient was to receive 4 months of chemotherapy and then return for restaging/surgical evaluation.  He is currently scheduled for imaging and rpv on 4/17 which coincides appropriately with treatment plan.  Navigator called spouse and left a voicemail with detailed appointment information  and my direct contact number as well if she has further questions.  Julie Cooper Clevenger, RN

## 2024-09-22 NOTE — Progress Notes (Signed)
 [facility] Clinical Social Work  Initial Assessment   Greg Mann is a 79 y.o. year old male contacted by phone. CSW spoke w/ both pt and wife to obtain details for assessment.  Clinical Social Work was referred by medical provider for assessment of psychosocial needs.   SDOH (Social Determinants of Health) assessments performed: Yes   SDOH Screenings   Food Insecurity: Low Risk (08/22/2024)   Received from [facility]  Housing: Low Risk (08/22/2024)   Received from [facility]  Transportation Needs: No Transportation Needs (08/22/2024)   Received from [facility]  Utilities: Low Risk (08/22/2024)   Received from [facility]  Depression (PHQ2-9): Low Risk (09/21/2024)  Financial Resource Strain: Low Risk (08/01/2024)   Received from [facility]  Social Connections: Unknown (08/22/2024)   Received from [facility]  Tobacco Use: Low Risk (09/06/2024)   Received from [facility]    PHQ 2/9:    09/21/2024    9:29 AM 09/14/2024    8:20 AM  Depression screen PHQ 2/9  Decreased Interest 0 0  Down, Depressed, Hopeless 1 1  PHQ - 2 Score 1 1     Distress Screen completed: No     No data to display            Family/Social Information:  Housing Arrangement: patient lives with his wife.  Pt's wife recently needed to have a hip replacement and is unable to provide transportation herself or attend pt's appointments at this time.   Family members/support persons in your life? Pt has a son residing locally and 1 in [place].  Family from out of state have been coming in for short visits to assist as well.   Transportation concerns: no, pt's church congregation has been assisting w/ transportation.  Employment: Retired .  Income source: Actor concerns: No Type of concern: None Food access concerns: no Religious or spiritual practice: Yes-[employer] Advanced directives: Not known Services Currently in place:  none  Coping/ Adjustment to  diagnosis: Patient understands treatment plan and what happens next? yes Concerns about diagnosis and/or treatment: Overwhelmed by information, Afraid of cancer, and Quality of life Patient reported stressors: Anxiety/ nervousness and Adjusting to my illness Hopes and/or priorities: pt's priority is to start treatment w/ the hope of positive results. Patient enjoys time with family/ friends Current coping skills/ strengths: Capable of independent living , Motivation for treatment/growth , Physical Health , and Supportive family/friends     SUMMARY: Current SDOH Barriers:  No barriers identified at this time.   Clinical Social Work Clinical Goal(s):  No clinical social work goals at this time  Interventions: Discussed common feeling and emotions when being diagnosed with cancer, and the importance of support during treatment Informed patient of the support team roles and support services at [facility] Provided CSW contact information and encouraged patient to call with any questions or concerns Referred patient to community resources: Wadie Rung.  Sent link via email to apply for SNAP benefits.     Follow Up Plan: Patient will contact CSW with any support or resource needs Patient verbalizes understanding of plan: Yes    Devere JONELLE Manna, LCSW Clinical Social Worker [facility]

## 2024-09-23 ENCOUNTER — Encounter: Payer: Self-pay | Admitting: Oncology

## 2024-09-23 ENCOUNTER — Inpatient Hospital Stay

## 2024-09-23 ENCOUNTER — Telehealth: Payer: Self-pay | Admitting: Pharmacy Technician

## 2024-09-23 ENCOUNTER — Other Ambulatory Visit (HOSPITAL_COMMUNITY): Payer: Self-pay

## 2024-09-23 NOTE — Telephone Encounter (Signed)
 Oral Oncology Patient Advocate Encounter  Prior Authorization for lidocaine -prilocaine  (EMLA ) cream has been approved.    PA# [id number] Effective dates: 09/23/2024 through 12/22/2024  Barbette (Patty) Chet Burnet, CPhT  [facility] - [facility], [place], [place] Hematology/Oncology - Oral Chemotherapy Patient Advocate Specialist III Phone: [phone]  Fax: [phone]

## 2024-09-23 NOTE — Telephone Encounter (Signed)
 Oral Oncology Patient Advocate Encounter   New authorization  Member ID: [id number]   Received notification that prior authorization for lidocaine -prilocaine  (EMLA ) cream is required.   PA submitted on CMM via Latent Key [id number] Status is pending     Zooey Schreurs (Patty) Chet Burnet, CPhT  [facility], [place], [place] Hematology/Oncology - Oral Chemotherapy Patient Advocate Specialist III Phone: [phone]  Fax: [phone]

## 2024-09-24 ENCOUNTER — Emergency Department (HOSPITAL_COMMUNITY)
Admission: EM | Admit: 2024-09-24 | Discharge: 2024-09-24 | Disposition: A | Discharge status: Home / Self Care | Attending: Emergency Medicine | Admitting: Emergency Medicine

## 2024-09-24 ENCOUNTER — Emergency Department (HOSPITAL_COMMUNITY)

## 2024-09-24 ENCOUNTER — Other Ambulatory Visit: Payer: Self-pay

## 2024-09-24 DIAGNOSIS — R21 Rash and other nonspecific skin eruption: Secondary | ICD-10-CM | POA: Diagnosis present

## 2024-09-24 DIAGNOSIS — Z7901 Long term (current) use of anticoagulants: Secondary | ICD-10-CM | POA: Insufficient documentation

## 2024-09-24 DIAGNOSIS — C259 Malignant neoplasm of pancreas, unspecified: Secondary | ICD-10-CM | POA: Insufficient documentation

## 2024-09-24 DIAGNOSIS — I4891 Unspecified atrial fibrillation: Secondary | ICD-10-CM | POA: Insufficient documentation

## 2024-09-24 DIAGNOSIS — T451X5A Adverse effect of antineoplastic and immunosuppressive drugs, initial encounter: Secondary | ICD-10-CM | POA: Insufficient documentation

## 2024-09-24 DIAGNOSIS — R4182 Altered mental status, unspecified: Secondary | ICD-10-CM | POA: Insufficient documentation

## 2024-09-24 DIAGNOSIS — M7989 Other specified soft tissue disorders: Secondary | ICD-10-CM | POA: Diagnosis not present

## 2024-09-24 DIAGNOSIS — R531 Weakness: Secondary | ICD-10-CM | POA: Diagnosis not present

## 2024-09-24 DIAGNOSIS — Z8507 Personal history of malignant neoplasm of pancreas: Secondary | ICD-10-CM | POA: Insufficient documentation

## 2024-09-24 LAB — RESP PANEL BY RT-PCR (RSV, FLU A&B, COVID)  RVPGX2
Influenza A by PCR: NEGATIVE
Influenza B by PCR: NEGATIVE
Resp Syncytial Virus by PCR: NEGATIVE
SARS Coronavirus 2 by RT PCR: NEGATIVE

## 2024-09-24 LAB — COMPREHENSIVE METABOLIC PANEL WITH GFR
ALT: 24 U/L (ref 0–44)
AST: 25 U/L (ref 15–41)
Albumin: 3.8 g/dL (ref 3.5–5.0)
Alkaline Phosphatase: 95 U/L (ref 38–126)
Anion gap: 13 (ref 5–15)
BUN: 19 mg/dL (ref 8–23)
CO2: 21 mmol/L — ABNORMAL LOW (ref 22–32)
Calcium: 8.6 mg/dL — ABNORMAL LOW (ref 8.9–10.3)
Chloride: 104 mmol/L (ref 98–111)
Creatinine, Ser: 0.81 mg/dL (ref 0.61–1.24)
GFR, Estimated: >60 mL/min
Glucose, Bld: 103 mg/dL — ABNORMAL HIGH (ref 70–99)
Potassium: 4.3 mmol/L (ref 3.5–5.1)
Sodium: 139 mmol/L (ref 135–145)
Total Bilirubin: 1.1 mg/dL (ref 0.0–1.2)
Total Protein: 6.5 g/dL (ref 6.5–8.1)

## 2024-09-24 LAB — CBC WITH DIFFERENTIAL/PLATELET
Abs Immature Granulocytes: 0.04 K/uL (ref 0.00–0.07)
Basophils Absolute: 0 K/uL (ref 0.0–0.1)
Basophils Relative: 0 %
Eosinophils Absolute: 0 K/uL (ref 0.0–0.5)
Eosinophils Relative: 0 %
HCT: 36.8 % — ABNORMAL LOW (ref 39.0–52.0)
Hemoglobin: 11.8 g/dL — ABNORMAL LOW (ref 13.0–17.0)
Immature Granulocytes: 1 %
Lymphocytes Relative: 11 %
Lymphs Abs: 0.7 K/uL (ref 0.7–4.0)
MCH: 31.4 pg (ref 26.0–34.0)
MCHC: 32.1 g/dL (ref 30.0–36.0)
MCV: 97.9 fL (ref 80.0–100.0)
Monocytes Absolute: 0.1 K/uL (ref 0.1–1.0)
Monocytes Relative: 1 %
Neutro Abs: 5.9 K/uL (ref 1.7–7.7)
Neutrophils Relative %: 87 %
Platelets: 203 K/uL (ref 150–400)
RBC: 3.76 MIL/uL — ABNORMAL LOW (ref 4.22–5.81)
RDW: 14.3 % (ref 11.5–15.5)
WBC: 6.8 K/uL (ref 4.0–10.5)
nRBC: 0 % (ref 0.0–0.2)

## 2024-09-24 LAB — LACTIC ACID, PLASMA: Lactic Acid, Venous: 1.3 mmol/L (ref 0.5–1.9)

## 2024-09-24 LAB — PROTIME-INR
INR: 1.2 (ref 0.8–1.2)
Prothrombin Time: 15.5 s — ABNORMAL HIGH (ref 11.4–15.2)

## 2024-09-24 MED ORDER — SODIUM CHLORIDE 0.9 % IV SOLN
2.0000 g | Freq: Once | INTRAVENOUS | Status: AC
  Administered 2024-09-24: 2 g via INTRAVENOUS
  Filled 2024-09-24: qty 20

## 2024-09-24 MED ORDER — SODIUM CHLORIDE 0.9 % IV BOLUS
2000.0000 mL | Freq: Once | INTRAVENOUS | Status: AC
  Administered 2024-09-24: 2000 mL via INTRAVENOUS

## 2024-09-24 NOTE — ED Triage Notes (Signed)
 Pt arrives via [facility] from home c/o worsening confusion and weakness. Pt states that he went to bed last night and woke up approximately one hour ago with symptoms. EMS reports that pt's wife was up with him all night from confusion and being weak. Pt reports weakness in legs L>R. Some swelling and redness noted to LLE. Per EMS pt recently had new chemo treatment that correlates with symptoms started.

## 2024-09-24 NOTE — Sepsis Progress Note (Addendum)
 1750 Notified bedside nurse of need to draw lactic acid and blood cultures, then give the antibiotics.    1820 Secure chat with bedside paramedic Donnice who stated that the blood cultures were drawn before giving the antibiotic. Attempting further bloodwork now.

## 2024-09-24 NOTE — Consult Note (Signed)
 "                                                                                       Patient Demographics  Greg Mann, is a 79 y.o. male   MRN: [id number]   DOB - [birth year]  Admit Date - 09/24/2024    Outpatient Primary MD for the patient is Shona Norleen PEDLAR, MD  Consult requested in the Hospital by Suzette Pac, MD, On 09/24/2024    Reason for consult : Left lower extremity redness   With History of -  Past Medical History:  Diagnosis Date   Borderline hyperlipidemia    Chest pain    Dysrhythmia    Exercise-induced tachycardia    Ganglion    left wrist   GERD (gastroesophageal reflux disease)    Thrombocytopenia    borderline      Past Surgical History:  Procedure Laterality Date   cataracts     COLONOSCOPY N/A 04/22/2018   Procedure: COLONOSCOPY;  Surgeon: Shaaron Lamar HERO, MD;  Location: [facility];  Service: Endoscopy;  Laterality: N/A;  8:30   GANGLION CYST EXCISION     Left Wrist   KNEE ARTHROSCOPY     Left   TONSILLECTOMY      in for   Chief Complaint  Patient presents with   Altered Mental Status   Weakness     HPI  Greg Mann  is a 79 y.o. male, past medical history of recently diagnosed pancreatic cancer, PE, A-fib on Eliquis , recently started chemotherapy with Taxol  and gemcitabine , most recent session 09/21/2024, presents to ED due to confusion and weakness, went to bed last time woke up the afternoon, he was noted to be with some confusion, and being weak, reports some lower extremity left more than right weakness, swelling and edema, he denies any chest pain, fever, or shortness of breath, in ED MRI brain with no acute finding, afebrile, white blood cell count within normal limit, noted to have left lower extremity edema and redness Dopplers were negative for DVT, patient had negative respiratory panel, chest x-ray with no acute finding, [facility] hospitalist consulted to evaluate for possible need of admission.    Review of Systems    A  full 10 point Review of Systems was done, except as stated above, all other Review of Systems were negative.   Social History Social History   Tobacco Use   Smoking status: Never   Smokeless tobacco: Never  Substance Use Topics   Alcohol use: Yes    Alcohol/week: 0.0 standard drinks of alcohol    Comment: Occasional wine    Family History Family History  Problem Relation Age of Onset   Aneurysm Father        Aortic   Coronary artery disease Brother        PCI     Prior to Admission medications  Medication Sig Start Date End Date Taking? Authorizing Provider  apixaban  (ELIQUIS ) 5 MG TABS tablet TAKE 1 TABLET BY MOUTH TWICE A DAY 03/08/24   Waddell Danelle ORN, MD  Bromfenac Sodium 0.07 % SOLN Apply 1 drop to eye See admin instructions. *Starting 3  days prior to surgery, instill 1 drop into operative eye twice daily as directed. Patient not taking: Reported on 09/14/2024    [provider]  gatifloxacin (ZYMAXID) 0.5 % SOLN 1 drop See admin instructions. Instill one drop 4 times daily in operative eye starting 3 days before surgery Patient not taking: Reported on 09/14/2024    [provider]  lidocaine -prilocaine  (EMLA ) cream Apply to affected area once 09/21/24   Kandala, Hyndavi, MD  ondansetron  (ZOFRAN ) 8 MG tablet Take 1 tablet (8 mg total) by mouth every 8 (eight) hours as needed for nausea or vomiting. 09/21/24   Kandala, Hyndavi, MD  prednisoLONE acetate (PRED FORTE) 1 % ophthalmic suspension 1 drop See admin instructions. Instill one drop 4 times daily into operative eye starting 3 days before surgery Patient not taking: Reported on 09/14/2024    [provider]  prochlorperazine  (COMPAZINE ) 10 MG tablet Take 1 tablet (10 mg total) by mouth every 6 (six) hours as needed for nausea or vomiting. 09/21/24   Davonna Siad, MD  propafenone  (RYTHMOL  SR) 225 MG 12 hr capsule Take 1 capsule (225 mg total) by mouth 2 (two) times daily. 05/21/24   Waddell Danelle ORN, MD   traMADol  (ULTRAM ) 50 MG tablet Take 50 mg by mouth every 6 (six) hours as needed. 08/01/24   [provider]    Anti-infectives (From admission, onward)    Start     Dose/Rate Route Frequency Ordered Stop   09/24/24 1630  cefTRIAXone  (ROCEPHIN ) 2 g in sodium chloride  0.9 % 100 mL IVPB        2 g 200 mL/hr over 30 Minutes Intravenous  Once 09/24/24 1620 09/24/24 1839       Scheduled Meds: Continuous Infusions: PRN Meds:.  Allergies[1]  Physical Exam  Vitals  Blood pressure 129/66, pulse (!) 56, temperature 97.7 F (36.5 C), temperature source Oral, resp. rate 17, SpO2 91%.   1. General Elderly male, laying in bed in no apparent distress  2. Normal affect and insight, Not Suicidal or Homicidal, Awake Alert, Oriented X 3.  3. No F.N deficits, ALL C.Nerves Intact, Strength 5/5 all 4 extremities, Sensation intact all 4 extremities, Plantars down going.  4. Ears and Eyes appear Normal, Conjunctivae clear, PERRLA. Moist Oral Mucosa.  5. Supple Neck, No JVD, No cervical lymphadenopathy appriciated, No Carotid Bruits.  6. Symmetrical Chest wall movement, Good air movement bilaterally, CTAB.  7. RRR, No Gallops, Rubs or Murmurs, No Parasternal Heave.  8. Positive Bowel Sounds, Abdomen Soft, No tenderness  9.  No Cyanosis, Normal Skin Turgor, or extremity with redness, swelling but not warm or tender  10. Good muscle tone,  joints appear normal , no effusions, Normal ROM.      CBC Recent Labs  Lab 09/21/24 0756 09/24/24 1824  WBC 3.1* 6.8  HGB 10.9* 11.8*  HCT 34.2* 36.8*  PLT 187 203  MCV 98.3 97.9  MCH 31.3 31.4  MCHC 31.9 32.1  RDW 14.3 14.3  LYMPHSABS 1.1 0.7  MONOABS 0.4 0.1  EOSABS 0.4 0.0  BASOSABS 0.0 0.0   ------------------------------------------------------------------------------------------------------------------  Chemistries  Recent Labs  Lab 09/21/24 0756 09/24/24 1824  NA 141 139  K 4.1 4.3  CL 107 104  CO2 22 21*   GLUCOSE 108* 103*  BUN 22 19  CREATININE 0.84 0.81  CALCIUM 8.9 8.6*  MG 2.4  --   AST 20 25  ALT 18 24  ALKPHOS 103 95  BILITOT 0.4 1.1   ------------------------------------------------------------------------------------------------------------------ estimated  creatinine clearance is 78.8 mL/min (by C-G formula based on SCr of 0.81 mg/dL). ------------------------------------------------------------------------------------------------------------------ No results for input(s): TSH, T4TOTAL, T3FREE, THYROIDAB in the last 72 hours.  Invalid input(s): FREET3   Coagulation profile Recent Labs  Lab 09/24/24 1824  INR 1.2   ------------------------------------------------------------------------------------------------------------------- No results for input(s): DDIMER in the last 72 hours. -------------------------------------------------------------------------------------------------------------------  Cardiac Enzymes No results for input(s): CKMB, TROPONINI, MYOGLOBIN in the last 168 hours.  Invalid input(s): CK ------------------------------------------------------------------------------------------------------------------ Invalid input(s): POCBNP   ---------------------------------------------------------------------------------------------------------------  Urinalysis    Component Value Date/Time   COLORURINE YELLOW 07/31/2024 0117   APPEARANCEUR CLEAR 07/31/2024 0117   LABSPEC 1.012 07/31/2024 0117   PHURINE 7.0 07/31/2024 0117   GLUCOSEU NEGATIVE 07/31/2024 0117   HGBUR NEGATIVE 07/31/2024 0117   BILIRUBINUR NEGATIVE 07/31/2024 0117   KETONESUR NEGATIVE 07/31/2024 0117   PROTEINUR NEGATIVE 07/31/2024 0117   NITRITE NEGATIVE 07/31/2024 0117   LEUKOCYTESUR NEGATIVE 07/31/2024 0117     Imaging results:   MR BRAIN WO CONTRAST Result Date: 09/24/2024 EXAM: MRI BRAIN WITHOUT CONTRAST 09/24/2024 06:57:48 PM TECHNIQUE: Multiplanar  multisequence MRI of the head/brain was performed without the administration of intravenous contrast. COMPARISON: None available. CLINICAL HISTORY: Mental status change, unknown cause. FINDINGS: BRAIN AND VENTRICLES: No acute infarct. No intracranial hemorrhage. No mass. No midline shift. No hydrocephalus. Multifocal hyperintense T2-weighted signal within the cerebral white matter, most commonly due to chronic small vessel disease. Mild volume loss. The sella is unremarkable. Normal flow voids. ORBITS: No significant abnormality. SINUSES AND MASTOIDS: No significant abnormality. BONES AND SOFT TISSUES: Normal marrow signal. No soft tissue abnormality. IMPRESSION: 1. Multifocal hyperintense T2-weighted signal within the cerebral white matter, most commonly due to chronic small vessel disease. 2. Mild volume loss. Electronically signed by: Franky Stanford MD 09/24/2024 07:12 PM EST RP Workstation: [id number]   US  Venous Img Lower Unilateral Left Result Date: 09/24/2024 EXAM: ULTRASOUND DUPLEX OF THE LEFT LOWER EXTREMITY VEINS TECHNIQUE: Duplex ultrasound using B-mode/gray scaled imaging and Doppler spectral analysis and color flow was obtained of the deep venous structures of the left lower extremity. COMPARISON: US  Left Lower Extremity Veins 12/22/2019. CLINICAL HISTORY: swelling FINDINGS: The common femoral vein, femoral vein, popliteal vein, and posterior tibial vein demonstrate normal compressibility with normal color flow and spectral analysis. IMPRESSION: 1. No evidence of deep venous thrombosis in the left lower extremity. Electronically signed by: Franky Crease MD 09/24/2024 05:43 PM EST RP Workstation: [id number]   DG Chest Port 1 View Result Date: 09/24/2024 EXAM: 1 VIEW(S) XRAY OF THE CHEST 09/24/2024 04:40:29 PM COMPARISON: 07/31/2024 CLINICAL HISTORY: Questionable sepsis - evaluate for abnormality FINDINGS: LINES, TUBES AND DEVICES: Right chest Port-A-Cath in place with tip overlying the distal SVC.  LUNGS AND PLEURA: Low lung volumes accentuate the cardiomediastinal silhouette and pulmonary vasculature. No focal consolidation. No pleural effusion. No pneumothorax. HEART AND MEDIASTINUM: The cardiomediastinal silhouette is accentuated due to low lung volumes. BONES AND SOFT TISSUES: No acute osseous abnormality. IMPRESSION: 1. No acute cardiopulmonary abnormality. Electronically signed by: Ryan Chess MD 09/24/2024 04:52 PM EST RP Workstation: [id number]   CT Head Wo Contrast Result Date: 09/24/2024 EXAM: CT HEAD WITHOUT CONTRAST 09/24/2024 04:36:15 PM TECHNIQUE: CT of the head was performed without the administration of intravenous contrast. Automated exposure control, iterative reconstruction, and/or weight based adjustment of the mA/kV was utilized to reduce the radiation dose to as low as reasonably achievable. COMPARISON: 07/31/2024 CLINICAL HISTORY: Stroke, follow up FINDINGS: BRAIN AND VENTRICLES: Unchanged cerebral volume loss with temporal predominance and associated bilateral hippocampal atrophy.  Unchanged background of mild chronic small vessel disease. Atherosclerotic calcifications in intracranial carotid and vertebral arteries. No acute hemorrhage. No evidence of acute infarct. No hydrocephalus. No extra-axial collection. No mass effect or midline shift. ORBITS: No acute abnormality. SINUSES: Mild mucosal thickening in ethmoid air cells. Air-fluid level in right sphenoid sinus. SOFT TISSUES AND SKULL: No acute soft tissue abnormality. No skull fracture. IMPRESSION: 1. No acute intracranial abnormality. 2. Ethmoid mucosal thickening and right sphenoid sinus air-fluid level, which can be seen with acute sinusitis. 3. Cerebral Atrophy (ICD10-G31.9). Electronically signed by: Ryan Chess MD 09/24/2024 04:51 PM EST RP Workstation: [id number]      Assessment & Plan  Active Problems:   Chemotherapy adverse reaction  Chemotherapy adverse reaction - Presents with multiple complaints,  [place] generalized weakness, confusion, and left leg redness, his workup is reassuring including MRI of the brain with no acute findings, no fever, normal white blood cell count, stable on physical exam, left lower extremity with some edema, but venous Dopplers is negative for DVT, and already is on Eliquis , upon time of my evaluation he denies any complaints, mentation at baseline, denies any left lower extremity pain, tenderness, there is no edema and redness, his MRI  brain, venous Dopplers, labs are reassuring, left lower extremity findings are not concerning for DVT or cellulitis, not tender, or warm to palpation, does appear to be typical for reticular hyperpigmentation secondary to Taxol  treatment, and edema and some neuropathy can be related to gemcitabine  chemotherapy, patient reports overall he is able to keep good oral and fluid intake, I have encouraged him to stay hydrated, no indication for antibiotics at this point, and patient be discharged home.       Thank you for the consult, we will follow the patient with you in the Hospital.   Brayton Lye M.D on 09/24/2024 at 8:58 PM  Between 7am to 7pm - Pager - [phone]   Thank you for the consult, we will follow the patient with you in the Hospital.   [facility]   Office  [phone]       [1] No Known Allergies  "

## 2024-09-24 NOTE — ED Notes (Signed)
Pt attempted to give urine sample but was unable.

## 2024-09-24 NOTE — Sepsis Progress Note (Signed)
 eLink is following this Code Sepsis.

## 2024-09-24 NOTE — Discharge Instructions (Signed)
 Keep the leg elevated.  Follow-up with your oncologist next week for recheck

## 2024-09-26 ENCOUNTER — Other Ambulatory Visit: Payer: Self-pay | Admitting: Internal Medicine

## 2024-09-26 DIAGNOSIS — I480 Paroxysmal atrial fibrillation: Secondary | ICD-10-CM

## 2024-09-26 NOTE — ED Provider Notes (Signed)
 " [place] EMERGENCY DEPARTMENT AT [facility] Provider Note   CSN: [id number] Arrival date & time: 09/24/24  1549     Patient presents with: Altered Mental Status and Weakness   Greg Mann is a 79 y.o. male.   Patient has pancreatic cancer and recently received a Taxol  treatment.  Patient presents with swelling and tenderness to left lower leg and mild confusion  The history is provided by the patient and medical records. No language interpreter was used.  Altered Mental Status Presenting symptoms: behavior changes   Severity:  Mild Most recent episode:  Today Episode history:  Single Timing:  Intermittent Progression:  Resolved Chronicity:  New Context: not alcohol use   Associated symptoms: no abdominal pain, no hallucinations, no headaches, no rash and no seizures        Prior to Admission medications  Medication Sig Start Date End Date Taking? Authorizing Provider  apixaban  (ELIQUIS ) 5 MG TABS tablet TAKE 1 TABLET BY MOUTH TWICE A DAY 03/08/24   Waddell Danelle ORN, MD  Bromfenac Sodium 0.07 % SOLN Apply 1 drop to eye See admin instructions. *Starting 3 days prior to surgery, instill 1 drop into operative eye twice daily as directed. Patient not taking: Reported on 09/14/2024    [provider]  gatifloxacin (ZYMAXID) 0.5 % SOLN 1 drop See admin instructions. Instill one drop 4 times daily in operative eye starting 3 days before surgery Patient not taking: Reported on 09/14/2024    [provider]  lidocaine -prilocaine  (EMLA ) cream Apply to affected area once 09/21/24   Kandala, Hyndavi, MD  ondansetron  (ZOFRAN ) 8 MG tablet Take 1 tablet (8 mg total) by mouth every 8 (eight) hours as needed for nausea or vomiting. 09/21/24   Kandala, Hyndavi, MD  prednisoLONE acetate (PRED FORTE) 1 % ophthalmic suspension 1 drop See admin instructions. Instill one drop 4 times daily into operative eye starting 3 days before surgery Patient not taking: Reported on  09/14/2024    [provider]  prochlorperazine  (COMPAZINE ) 10 MG tablet Take 1 tablet (10 mg total) by mouth every 6 (six) hours as needed for nausea or vomiting. 09/21/24   Davonna Siad, MD  propafenone  (RYTHMOL  SR) 225 MG 12 hr capsule Take 1 capsule (225 mg total) by mouth 2 (two) times daily. 05/21/24   Waddell Danelle ORN, MD  traMADol  (ULTRAM ) 50 MG tablet Take 50 mg by mouth every 6 (six) hours as needed. 08/01/24   [provider]    Allergies: Patient has no known allergies.    Review of Systems  Constitutional:  Negative for appetite change and fatigue.  HENT:  Negative for congestion, ear discharge and sinus pressure.   Eyes:  Negative for discharge.  Respiratory:  Negative for cough.   Cardiovascular:  Negative for chest pain.  Gastrointestinal:  Negative for abdominal pain and diarrhea.  Genitourinary:  Negative for frequency and hematuria.  Musculoskeletal:  Negative for back pain.       Swelling and redness to left lower leg  Skin:  Negative for rash.  Neurological:  Negative for seizures and headaches.  Psychiatric/Behavioral:  Negative for hallucinations.     Updated Vital Signs BP 129/66   Pulse (!) 56   Temp 97.7 F (36.5 C) (Oral)   Resp 17   SpO2 91%   Physical Exam Vitals and nursing note reviewed.  Constitutional:      Appearance: He is well-developed.  HENT:     Head: Normocephalic.  Nose: Nose normal.  Eyes:     General: No scleral icterus.    Conjunctiva/sclera: Conjunctivae normal.  Neck:     Thyroid : No thyromegaly.  Cardiovascular:     Rate and Rhythm: Normal rate and regular rhythm.     Heart sounds: No murmur heard.    No friction rub. No gallop.  Pulmonary:     Breath sounds: No stridor. No wheezing or rales.  Chest:     Chest wall: No tenderness.  Abdominal:     General: There is no distension.     Tenderness: There is no abdominal tenderness. There is no rebound.  Musculoskeletal:        General: Normal range  of motion.     Cervical back: Neck supple.  Lymphadenopathy:     Cervical: No cervical adenopathy.  Skin:    Findings: Rash present. No erythema.     Comments: Swelling and redness to left lower leg  Neurological:     Mental Status: He is alert and oriented to person, place, and time.     Motor: No abnormal muscle tone.     Coordination: Coordination normal.  Psychiatric:        Behavior: Behavior normal.     (all labs ordered are listed, but only abnormal results are displayed) Labs Reviewed  COMPREHENSIVE METABOLIC PANEL WITH GFR - Abnormal; Notable for the following components:      Result Value   CO2 21 (*)    Glucose, Bld 103 (*)    Calcium 8.6 (*)    All other components within normal limits  CBC WITH DIFFERENTIAL/PLATELET - Abnormal; Notable for the following components:   RBC 3.76 (*)    Hemoglobin 11.8 (*)    HCT 36.8 (*)    All other components within normal limits  PROTIME-INR - Abnormal; Notable for the following components:   Prothrombin Time 15.5 (*)    All other components within normal limits  RESP PANEL BY RT-PCR (RSV, FLU A&B, COVID)  RVPGX2  CULTURE, BLOOD (ROUTINE X 2)  CULTURE, BLOOD (ROUTINE X 2)  LACTIC ACID, PLASMA    EKG: EKG Interpretation Date/Time:  Friday September 24 2024 17:19:27 EST Ventricular Rate:  60 PR Interval:  177 QRS Duration:  118 QT Interval:  455 QTC Calculation: 455 R Axis:   94  Text Interpretation: Sinus rhythm Left atrial enlargement Nonspecific intraventricular conduction delay Borderline T abnormalities, anterior leads Confirmed by Trine Likes [phone]) on 09/25/2024 3:18:16 PM  Radiology: MR BRAIN WO CONTRAST Result Date: 09/24/2024 EXAM: MRI BRAIN WITHOUT CONTRAST 09/24/2024 06:57:48 PM TECHNIQUE: Multiplanar multisequence MRI of the head/brain was performed without the administration of intravenous contrast. COMPARISON: None available. CLINICAL HISTORY: Mental status change, unknown cause. FINDINGS: BRAIN AND  VENTRICLES: No acute infarct. No intracranial hemorrhage. No mass. No midline shift. No hydrocephalus. Multifocal hyperintense T2-weighted signal within the cerebral white matter, most commonly due to chronic small vessel disease. Mild volume loss. The sella is unremarkable. Normal flow voids. ORBITS: No significant abnormality. SINUSES AND MASTOIDS: No significant abnormality. BONES AND SOFT TISSUES: Normal marrow signal. No soft tissue abnormality. IMPRESSION: 1. Multifocal hyperintense T2-weighted signal within the cerebral white matter, most commonly due to chronic small vessel disease. 2. Mild volume loss. Electronically signed by: Franky Stanford MD 09/24/2024 07:12 PM EST RP Workstation: [id number]   US  Venous Img Lower Unilateral Left Result Date: 09/24/2024 EXAM: ULTRASOUND DUPLEX OF THE LEFT LOWER EXTREMITY VEINS TECHNIQUE: Duplex ultrasound using B-mode/gray scaled imaging and Doppler spectral analysis  and color flow was obtained of the deep venous structures of the left lower extremity. COMPARISON: US  Left Lower Extremity Veins 12/22/2019. CLINICAL HISTORY: swelling FINDINGS: The common femoral vein, femoral vein, popliteal vein, and posterior tibial vein demonstrate normal compressibility with normal color flow and spectral analysis. IMPRESSION: 1. No evidence of deep venous thrombosis in the left lower extremity. Electronically signed by: Franky Crease MD 09/24/2024 05:43 PM EST RP Workstation: [id number]   DG Chest Port 1 View Result Date: 09/24/2024 EXAM: 1 VIEW(S) XRAY OF THE CHEST 09/24/2024 04:40:29 PM COMPARISON: 07/31/2024 CLINICAL HISTORY: Questionable sepsis - evaluate for abnormality FINDINGS: LINES, TUBES AND DEVICES: Right chest Port-A-Cath in place with tip overlying the distal SVC. LUNGS AND PLEURA: Low lung volumes accentuate the cardiomediastinal silhouette and pulmonary vasculature. No focal consolidation. No pleural effusion. No pneumothorax. HEART AND MEDIASTINUM: The  cardiomediastinal silhouette is accentuated due to low lung volumes. BONES AND SOFT TISSUES: No acute osseous abnormality. IMPRESSION: 1. No acute cardiopulmonary abnormality. Electronically signed by: Ryan Chess MD 09/24/2024 04:52 PM EST RP Workstation: [id number]   CT Head Wo Contrast Result Date: 09/24/2024 EXAM: CT HEAD WITHOUT CONTRAST 09/24/2024 04:36:15 PM TECHNIQUE: CT of the head was performed without the administration of intravenous contrast. Automated exposure control, iterative reconstruction, and/or weight based adjustment of the mA/kV was utilized to reduce the radiation dose to as low as reasonably achievable. COMPARISON: 07/31/2024 CLINICAL HISTORY: Stroke, follow up FINDINGS: BRAIN AND VENTRICLES: Unchanged cerebral volume loss with temporal predominance and associated bilateral hippocampal atrophy. Unchanged background of mild chronic small vessel disease. Atherosclerotic calcifications in intracranial carotid and vertebral arteries. No acute hemorrhage. No evidence of acute infarct. No hydrocephalus. No extra-axial collection. No mass effect or midline shift. ORBITS: No acute abnormality. SINUSES: Mild mucosal thickening in ethmoid air cells. Air-fluid level in right sphenoid sinus. SOFT TISSUES AND SKULL: No acute soft tissue abnormality. No skull fracture. IMPRESSION: 1. No acute intracranial abnormality. 2. Ethmoid mucosal thickening and right sphenoid sinus air-fluid level, which can be seen with acute sinusitis. 3. Cerebral Atrophy (ICD10-G31.9). Electronically signed by: Ryan Chess MD 09/24/2024 04:51 PM EST RP Workstation: [id number]     Procedures   Medications Ordered in the ED  sodium chloride  0.9 % bolus 2,000 mL (0 mLs Intravenous Stopped 09/24/24 1952)  cefTRIAXone  (ROCEPHIN ) 2 g in sodium chloride  0.9 % 100 mL IVPB (0 g Intravenous Stopped 09/24/24 1839)   DVT study negative in left lower leg, MRI of the brain negative.  Confusion has improved.  I consulted the  hospitalist and they evaluated the patient and felt like the symptoms were all related to his Taxol  treatment for his chemo.                                 Medical Decision Making Amount and/or Complexity of Data Reviewed Labs: ordered. Radiology: ordered.   Reaction to Taxol .  Patient with swelling of left lower leg.  He will keep his leg elevated and follow-up with his oncologist     Final diagnoses:  Weakness  Rash    ED Discharge Orders     None          Suzette Pac, MD 09/26/24 1047  "

## 2024-09-27 ENCOUNTER — Inpatient Hospital Stay: Admitting: Oncology

## 2024-09-29 LAB — CULTURE, BLOOD (ROUTINE X 2)
Culture: NO GROWTH
Culture: NO GROWTH
Special Requests: ADEQUATE

## 2024-10-04 ENCOUNTER — Telehealth: Payer: Self-pay | Admitting: Dietician

## 2024-10-04 ENCOUNTER — Inpatient Hospital Stay: Admitting: Dietician

## 2024-10-04 NOTE — Telephone Encounter (Signed)
 Nutrition Assessment   Reason for Assessment:    ASSESSMENT: 79 year old male with stage I pancreatic cancer. He is receiving neoadjuvant chemotherapy with gemcitabine /abraxane  q28d. Patient is under the care of Dr. Davonna  Past medical history includes paroxysmal atrial fibrillation, RLL pneumonia, biliary obstruction, acute PE  Spoke with patient and wife via telephone. Majority of history provided by wife. Patient reports tolerating treatment well overall. Had a little nausea after last chemo. This resolved with antiemetics. Wife reports patient has always has been a good eater. Usually eats a good breakfast and dinner meal with a light lunch. Recently, he has not been eating as often as wife would like him to due to sleeping in. Did not get up yesterday until almost noon. Goes to bed 10-11PM. Wife is s/p hip replacement 12/26 after fall. Reports church family has been very supportive and bringing food to the house. Patient had leftover chicken/dumplings with extra chicken and broccoli for dinner. Patient does not drink much water .  Likes diet green [place]  tea.  Nutrition Focused Physical Exam: unable to complete (telephone visit)    Medications: zofran , compazine , propafenone , tramadol    Labs: glucose 103   Anthropometrics:   Height: 5'11 Weight: 168 lb 14 oz UBW: 170 lb  BMI: 23.55   NUTRITION DIAGNOSIS: Food and nutrition related knowledge deficit related to cancer as evidenced by no prior need for associated nutrition information   INTERVENTION:  Educated on importance of adequate calories and protein energy intake to maintain strength/weights, support post-op healing Encourage smaller meals/snacks 5-7 times/day Suggested adding bedtime snack (peanut butter, cheese toast) Educated on foods with protein, recommend protein source at every meal Discussed strategies for nausea, foods best tolerated and foods to avoid Continue antiemetics as needed Encourage daily body  movement Will provide cookbook at 1/27 [facility] visit  Contact information provided   MONITORING, EVALUATION, GOAL: Patient will tolerate increased calories and protein to minimize wt loss during treatment    Next Visit: Monday February 16 via telephone - pt and wife aware

## 2024-10-04 NOTE — Progress Notes (Signed)
 " Patient Care Team: Shona Norleen PEDLAR, MD as PCP - General (Internal Medicine) Waddell Danelle ORN, MD as PCP - Electrophysiology (Cardiology) Edith, Debby BROCKS, MD as Attending Physician (Cardiology) Davonna Siad, MD as Medical Oncologist (Medical Oncology) Celestia Joesph SQUIBB, RN as Oncology Nurse Navigator (Medical Oncology)  Clinic Day:  10/05/2024  Referring physician: Shona Norleen PEDLAR, MD   CHIEF COMPLAINT:  CC: Pancreatic adenocarcinoma   ASSESSMENT & PLAN:   Assessment & Plan: Greg Mann  is a 79 y.o. male with pancreatic adenocarcinoma  Assessment and Plan  Pancreatic adenocarcinoma Stage I pancreatic adenocarcinoma Extensive oncology history above Upfront surgery not done as patient has a recent diagnosis of pulmonary embolism. Recommended neoadj chemotherapy Started on Gem/Abraxane  09/06/2024   - C3D1 of Gem/Abraxane . Tolerated previous cycles well.  - Patient has a CT scan scheduled in April and follow up with Dr.Shen( surgery) after that - Will continue to follow with Dr[facility]McCormick at [facility] - Continue Gem/Abraxane  every 2 weeks - Continue zofran  for nausea prevention and emla  cream   Return to clinic with every other cycle to assess for tolerance   Pulmonary embolism - Continue Eliquis  5mg  twice daily     Pedal edema ECHO with LVEF 50-55% Left leg US : Negative for DVT Improved from prior   - Prescribed lasix  40mg  as needed daily - Recommended leg elevation    The patient understands the plans discussed today and is in agreement with them.  He knows to contact our office if he develops concerns prior to his next appointment.  23 minutes of total time was spent for this patient encounter, including preparation,face-to-face counseling with the patient and coordination of care, physical exam, and documentation of the encounter.    LILLETTE Verneta SAUNDERS Teague,acting as a neurosurgeon for Siad Davonna, MD.,have documented all relevant documentation on the behalf of  Siad Davonna, MD,as directed by  Siad Davonna, MD while in the presence of Siad Davonna, MD.  I, Siad Davonna MD, have reviewed the above documentation for accuracy and completeness, and I agree with the above.    Siad Davonna, MD  [facility] [place] - A DEPT OF [facility] [address] [place] [address] Dept: [phone] Dept Fax: [phone]   No orders of the defined types were placed in this encounter.    ONCOLOGY HISTORY:   Diagnosis: Stage I Pancreatic adenocarcinoma  -07/31/2024: CA 19-9: 55. ALP 362. AST 112. ALT 215. Calcium 8.5. Total Protein 5.4. -07/31/2024: CT CAP: Intrahepatic and extrahepatic biliary ductal dilatation, with the common bile duct measuring up to 1.6 cm. Pancreatic ductal dilatation measuring up to 9 mm. No visible pancreatic head mass; given the pancreatic ductal and biliary dilatation, recommend MRI to exclude a pancreatic head mass. -07/31/2024: MRI abdomen: Marked dilatation of the common bile duct and pancreatic duct with abrupt termination in the pancreatic head is highly suggestive of malignant obstruction in the pancreatic head. Recommend correlation with serum tumor markers and recommend endoscopic ultrasound for evaluation and tissue sampling. No evidence of hepatic metastasis. No periportal or peripancreatic lymphadenopathy identified. No clear vascular involvement as the lesion is not readily identified. -08/11/2024: EUS with pancreatic mass biopsy.              - Mass measuring 30 mm x 25 mm was visualized in the pancreas. The irregular and hypoechoic NX mass with poorly defined margins was visualized  Pathology: Adenocarcinoma. Smears shows atypical cells arranged in sheets  and singly. The atypical cells demonstrate anisonucleosis, irregular nuclear contours, and coarse chromatin.  -08/20/2024: CEA 2.3 (normal) -08/20/2024: CT CAP: Interval biliary sphincterotomy with  placement of a metal CBD stent with pancreatic ductal dilatation measuring upwards to 9 mm. No visible pancreatic head mass given CT limitations. Incidentally noted right lower lobe subsegmental pulmonary embolism. Similar to slight increase of a peripancreatic lymph node measuring 1.1 cm. 5 mm right middle lobe nodule.  -08/24/2024: Patient evaluated by medical oncology at [facility] who recommended neoadjuvant gemcitabine / nab-paclitaxel  therapy. He was felt not to be a surgical candidate at the time due to a pulmonary embolism, though tentatively patient may be eligible for resection 4 to 6 months after treatment.  -08/27/2024: Port placed -08/27/2024- Current: Gemcitabine  and Abraxane  every 2 weeks -08/31/2024: [facility] NGS: TMB 4 mt/Mb, MSI Stable, LOH Low 6%                          -Mutated, Pathogenic CDKN1B (p.R19fs) [45%], KMT2C (p.D2521fs) [34%], KRAS (p.G12D) [49%] , TP53 (p.R213*) [50%]                          - Deletion MTAP  -09/06/2024: CA 19-9: 40  Current Treatment:  Gemcitabine  and Abraxane  every 2 weeks  INTERVAL HISTORY:   Discussed the use of AI scribe software for clinical note transcription with the patient, who gave verbal consent to proceed.  History of Present Illness Greg Mann is a 79 year old male with stage I pancreatic cancer undergoing chemotherapy who presents for routine oncology follow-up.  He is currently receiving chemotherapy for stage I pancreatic cancer. He denies nausea, vomiting, or pain, and reports feeling well overall.  He was recently evaluated in the emergency department for lower extremity swelling and erythema. Imaging, including ultrasound was performed during his emergency department visit. The leg remains swollen with a red patch, but he states that it is improved compared to prior visits and denies current pain. He is not currently taking furosemide  and does not recall having a prescription for it.  No other complaints or new symptoms  were discussed during the visit.    I have reviewed the past medical history, past surgical history, social history and family history with the patient and they are unchanged from previous note.  ALLERGIES:  has no known allergies.  MEDICATIONS:  Current Outpatient Medications  Medication Sig Dispense Refill   ELIQUIS  5 MG TABS tablet TAKE 1 TABLET BY MOUTH TWICE A DAY 60 tablet 5   furosemide  (LASIX ) 40 MG tablet Take 1 tablet (40 mg total) by mouth daily as needed for edema. Take in the morning as needed for leg swelling 30 tablet 1   ondansetron  (ZOFRAN ) 8 MG tablet Take 1 tablet (8 mg total) by mouth every 8 (eight) hours as needed for nausea or vomiting. 30 tablet 1   prochlorperazine  (COMPAZINE ) 10 MG tablet Take 1 tablet (10 mg total) by mouth every 6 (six) hours as needed for nausea or vomiting. 30 tablet 1   propafenone  (RYTHMOL  SR) 225 MG 12 hr capsule Take 1 capsule (225 mg total) by mouth 2 (two) times daily. 180 capsule 3   traMADol  (ULTRAM ) 50 MG tablet Take 50 mg by mouth every 6 (six) hours as needed.     Bromfenac Sodium 0.07 % SOLN Apply 1 drop to eye See admin instructions. *Starting 3 days prior to surgery, instill 1 drop into operative eye  twice daily as directed. (Patient not taking: Reported on 10/05/2024)     gatifloxacin (ZYMAXID) 0.5 % SOLN 1 drop See admin instructions. Instill one drop 4 times daily in operative eye starting 3 days before surgery (Patient not taking: Reported on 10/05/2024)     lidocaine -prilocaine  (EMLA ) cream Apply to affected area once 30 g 3   prednisoLONE acetate (PRED FORTE) 1 % ophthalmic suspension 1 drop See admin instructions. Instill one drop 4 times daily into operative eye starting 3 days before surgery (Patient not taking: Reported on 10/05/2024)     No current facility-administered medications for this visit.   Facility-Administered Medications Ordered in Other Visits  Medication Dose Route Frequency Provider Last Rate Last Admin    0.9 %  sodium chloride  infusion   Intravenous Continuous Sherrie Marsan, MD       dexamethasone  (DECADRON ) injection 10 mg  10 mg Intravenous Once Tramel Westbrook, MD       gemcitabine  (GEMZAR ) 1,938 mg in sodium chloride  0.9 % 250 mL chemo infusion  1,000 mg/m2 (Treatment Plan Recorded) Intravenous Once Lorraina Spring, MD       ondansetron  (ZOFRAN ) injection 8 mg  8 mg Intravenous Once Marili Vader, MD       PACLitaxel -protein bound (ABRAXANE ) chemo infusion 245 mg  125 mg/m2 (Treatment Plan Recorded) Intravenous Once Roshaun Pound, MD         VITALS:  Height 5' 11 (1.803 m), weight 170 lb (77.1 kg).  Wt Readings from Last 3 Encounters:  10/05/24 170 lb (77.1 kg)  09/21/24 168 lb 14 oz (76.6 kg)  09/14/24 165 lb 12.8 oz (75.2 kg)    Body mass index is 23.71 kg/m.  Performance status (ECOG): 1 - Symptomatic but completely ambulatory  PHYSICAL EXAM:   GENERAL:alert, no distress and comfortable SKIN: skin color, texture, turgor are normal, no rashes or significant lesions LYMPH:  no palpable lymphadenopathy in the cervical, axillary or inguinal LUNGS: clear to auscultation and percussion with normal breathing effort HEART: regular rate & rhythm and no murmurs. B/L pedal edema extending upto the knee-improved.  ABDOMEN:abdomen soft, non-tender and normal bowel sounds Musculoskeletal:no cyanosis of digits and no clubbing  NEURO: alert & oriented x 3 with fluent speech  LABORATORY DATA:  I have reviewed the data as listed     Component Value Date/Time   NA 144 10/05/2024 1241   K 4.4 10/05/2024 1241   CL 108 10/05/2024 1241   CO2 25 10/05/2024 1241   GLUCOSE 85 10/05/2024 1241   BUN 23 10/05/2024 1241   CREATININE 1.10 10/05/2024 1241   CREATININE 0.98 07/15/2013 1448   CALCIUM 8.9 10/05/2024 1241   PROT 6.5 10/05/2024 1241   ALBUMIN 4.2 10/05/2024 1241   AST 17 10/05/2024 1241   ALT 15 10/05/2024 1241   ALKPHOS 83 10/05/2024 1241   BILITOT 0.3 10/05/2024  1241   GFRNONAA >60 10/05/2024 1241   GFRAA 86 (L) 11/02/2012 1905     Lab Results  Component Value Date   WBC 4.4 10/05/2024   NEUTROABS 2.4 10/05/2024   HGB 11.3 (L) 10/05/2024   HCT 35.4 (L) 10/05/2024   MCV 99.7 10/05/2024   PLT 146 (L) 10/05/2024     RADIOGRAPHIC STUDIES: I have personally reviewed the radiological images as listed and agreed with the findings in the report.  "

## 2024-10-05 ENCOUNTER — Inpatient Hospital Stay: Admitting: Oncology

## 2024-10-05 ENCOUNTER — Inpatient Hospital Stay

## 2024-10-05 ENCOUNTER — Other Ambulatory Visit: Payer: Self-pay

## 2024-10-05 VITALS — Ht 71.0 in | Wt 170.0 lb

## 2024-10-05 VITALS — BP 134/74 | HR 56 | Temp 97.0°F | Resp 18

## 2024-10-05 DIAGNOSIS — C259 Malignant neoplasm of pancreas, unspecified: Secondary | ICD-10-CM

## 2024-10-05 DIAGNOSIS — R600 Localized edema: Secondary | ICD-10-CM

## 2024-10-05 DIAGNOSIS — I2699 Other pulmonary embolism without acute cor pulmonale: Secondary | ICD-10-CM | POA: Diagnosis not present

## 2024-10-05 DIAGNOSIS — Z5111 Encounter for antineoplastic chemotherapy: Secondary | ICD-10-CM | POA: Diagnosis not present

## 2024-10-05 LAB — CBC WITH DIFFERENTIAL/PLATELET
Abs Immature Granulocytes: 0.01 10*3/uL (ref 0.00–0.07)
Basophils Absolute: 0.1 10*3/uL (ref 0.0–0.1)
Basophils Relative: 1 %
Eosinophils Absolute: 0.2 10*3/uL (ref 0.0–0.5)
Eosinophils Relative: 4 %
HCT: 35.4 % — ABNORMAL LOW (ref 39.0–52.0)
Hemoglobin: 11.3 g/dL — ABNORMAL LOW (ref 13.0–17.0)
Immature Granulocytes: 0 %
Lymphocytes Relative: 29 %
Lymphs Abs: 1.3 10*3/uL (ref 0.7–4.0)
MCH: 31.8 pg (ref 26.0–34.0)
MCHC: 31.9 g/dL (ref 30.0–36.0)
MCV: 99.7 fL (ref 80.0–100.0)
Monocytes Absolute: 0.5 10*3/uL (ref 0.1–1.0)
Monocytes Relative: 12 %
Neutro Abs: 2.4 10*3/uL (ref 1.7–7.7)
Neutrophils Relative %: 54 %
Platelets: 146 10*3/uL — ABNORMAL LOW (ref 150–400)
RBC: 3.55 MIL/uL — ABNORMAL LOW (ref 4.22–5.81)
RDW: 14.8 % (ref 11.5–15.5)
WBC: 4.4 10*3/uL (ref 4.0–10.5)
nRBC: 0 % (ref 0.0–0.2)

## 2024-10-05 LAB — COMPREHENSIVE METABOLIC PANEL WITH GFR
ALT: 15 U/L (ref 0–44)
AST: 17 U/L (ref 15–41)
Albumin: 4.2 g/dL (ref 3.5–5.0)
Alkaline Phosphatase: 83 U/L (ref 38–126)
Anion gap: 11 (ref 5–15)
BUN: 23 mg/dL (ref 8–23)
CO2: 25 mmol/L (ref 22–32)
Calcium: 8.9 mg/dL (ref 8.9–10.3)
Chloride: 108 mmol/L (ref 98–111)
Creatinine, Ser: 1.1 mg/dL (ref 0.61–1.24)
GFR, Estimated: >60 mL/min
Glucose, Bld: 85 mg/dL (ref 70–99)
Potassium: 4.4 mmol/L (ref 3.5–5.1)
Sodium: 144 mmol/L (ref 135–145)
Total Bilirubin: 0.3 mg/dL (ref 0.0–1.2)
Total Protein: 6.5 g/dL (ref 6.5–8.1)

## 2024-10-05 LAB — MAGNESIUM: Magnesium: 2.5 mg/dL — ABNORMAL HIGH (ref 1.7–2.4)

## 2024-10-05 MED ORDER — FUROSEMIDE 40 MG PO TABS: 40.0000 mg | ORAL_TABLET | Freq: Every day | ORAL | 1 refills | Status: AC | PRN

## 2024-10-05 MED ORDER — DEXAMETHASONE SOD PHOSPHATE PF 10 MG/ML IJ SOLN
10.0000 mg | Freq: Once | INTRAMUSCULAR | Status: AC
  Administered 2024-10-05: 10 mg via INTRAVENOUS
  Filled 2024-10-05: qty 1

## 2024-10-05 MED ORDER — SODIUM CHLORIDE 0.9 % IV SOLN
1000.0000 mg/m2 | Freq: Once | INTRAVENOUS | Status: AC
  Administered 2024-10-05: 1938 mg via INTRAVENOUS
  Filled 2024-10-05: qty 50.97

## 2024-10-05 MED ORDER — PACLITAXEL PROTEIN-BOUND CHEMO INJECTION 100 MG
125.0000 mg/m2 | Freq: Once | INTRAVENOUS | Status: AC
  Administered 2024-10-05: 245 mg via INTRAVENOUS
  Filled 2024-10-05: qty 49

## 2024-10-05 MED ORDER — LIDOCAINE-PRILOCAINE 2.5-2.5 % EX CREA: TOPICAL_CREAM | CUTANEOUS | 3 refills | Status: AC

## 2024-10-05 MED ORDER — ONDANSETRON HCL 4 MG/2ML IJ SOLN
8.0000 mg | Freq: Once | INTRAMUSCULAR | Status: AC
  Administered 2024-10-05: 8 mg via INTRAVENOUS
  Filled 2024-10-05: qty 4

## 2024-10-05 MED ORDER — SODIUM CHLORIDE 0.9 % IV SOLN: INTRAVENOUS | Status: DC

## 2024-10-05 NOTE — Patient Instructions (Signed)
 [facility] [place] - A DEPT OF [facility]  Discharge Instructions: Thank you for choosing [facility] to provide your oncology and hematology care.  If you have a lab appointment with the Cancer Center - please note that after April 8th, 2024, all labs will be drawn in the cancer center.  You do not have to check in or register with the main entrance as you have in the past but will complete your check-in in the cancer center.  Wear comfortable clothing and clothing appropriate for easy access to any Portacath or PICC line.   We strive to give you quality time with your provider. You may need to reschedule your appointment if you arrive late (15 or more minutes).  Arriving late affects you and other patients whose appointments are after yours.  Also, if you miss three or more appointments without notifying the office, you may be dismissed from the clinic at the provider's discretion.      For prescription refill requests, have your pharmacy contact our office and allow 72 hours for refills to be completed.    Today you received the following chemotherapy and/or immunotherapy agents Abraxane/Gemzar   To help prevent nausea and vomiting after your treatment, we encourage you to take your nausea medication as directed.  Paclitaxel Nanoparticle Albumin-Bound Injection What is this medication? NANOPARTICLE ALBUMIN-BOUND PACLITAXEL (Na no PAHR ti kuhl al BYOO muhn-bound PAK li TAX el) treats some types of cancer. It works by slowing down the growth of cancer cells. This medicine may be used for other purposes; ask your health care provider or pharmacist if you have questions. COMMON BRAND NAME(S): Abraxane What should I tell my care team before I take this medication? They need to know if you have any of these conditions: Liver disease Low white blood cell levels An unusual or allergic reaction to paclitaxel, albumin, other medications, foods, dyes, or  preservatives If you or your partner are pregnant or trying to get pregnant Breast-feeding How should I use this medication? This medication is injected into a vein. It is given by your care team in a hospital or clinic setting. Talk to your care team about the use of this medication in children. Special care may be needed. Overdosage: If you think you have taken too much of this medicine contact a poison control center or emergency room at once. NOTE: This medicine is only for you. Do not share this medicine with others. What if I miss a dose? Keep appointments for follow-up doses. It is important not to miss your dose. Call your care team if you are unable to keep an appointment. What may interact with this medication? Other medications may affect the way this medication works. Talk with your care team about all of the medications you take. They may suggest changes to your treatment plan to lower the risk of side effects and to make sure your medications work as intended. This list may not describe all possible interactions. Give your health care provider a list of all the medicines, herbs, non-prescription drugs, or dietary supplements you use. Also tell them if you smoke, drink alcohol, or use illegal drugs. Some items may interact with your medicine. What should I watch for while using this medication? Your condition will be monitored carefully while you are receiving this medication. You may need blood work while taking this medication. This medication may make you feel generally unwell. This is not uncommon as chemotherapy can  affect healthy cells as well as cancer cells. Report any side effects. Continue your course of treatment even though you feel ill unless your care team tells you to stop. This medication can cause serious allergic reactions. To reduce the risk, your care team may give you other medications to take before receiving this one. Be sure to follow the directions from your care  team. This medication may increase your risk of getting an infection. Call your care team for advice if you get a fever, chills, sore throat, or other symptoms of a cold or flu. Do not treat yourself. Try to avoid being around people who are sick. This medication may increase your risk to bruise or bleed. Call your care team if you notice any unusual bleeding. Be careful brushing or flossing your teeth or using a toothpick because you may get an infection or bleed more easily. If you have any dental work done, tell your dentist you are receiving this medication. Talk to your care team if you or your partner may be pregnant. Serious birth defects can occur if you take this medication during pregnancy and for 6 months after the last dose. You will need a negative pregnancy test before starting this medication. Contraception is recommended while taking this medication and for 6 months after the last dose. Your care team can help you find the option that works for you. If your partner can get pregnant, use a condom during sex while taking this medication and for 3 months after the last dose. Do not breastfeed while taking this medication and for 2 weeks after the last dose. This medication may cause infertility. Talk to your care team if you are concerned about your fertility. What side effects may I notice from receiving this medication? Side effects that you should report to your care team as soon as possible: Allergic reactions--skin rash, itching, hives, swelling of the face, lips, tongue, or throat Dry cough, shortness of breath or trouble breathing Infection--fever, chills, cough, sore throat, wounds that don't heal, pain or trouble when passing urine, general feeling of discomfort or being unwell Low red blood cell level--unusual weakness or fatigue, dizziness, headache, trouble breathing Pain, tingling, or numbness in the hands or feet Stomach pain, unusual weakness or fatigue, nausea, vomiting,  diarrhea, or fever that lasts longer than expected Unusual bruising or bleeding Side effects that usually do not require medical attention (report to your care team if they continue or are bothersome): Diarrhea Fatigue Hair loss Loss of appetite Nausea Vomiting This list may not describe all possible side effects. Call your doctor for medical advice about side effects. You may report side effects to FDA at 1-800-FDA-1088. Where should I keep my medication? This medication is given in a hospital or clinic. It will not be stored at home. NOTE: This sheet is a summary. It may not cover all possible information. If you have questions about this medicine, talk to your doctor, pharmacist, or health care provider.  2024 Elsevier/Gold Standard (2022-01-10 00:00:00)  Gemcitabine Injection What is this medication? GEMCITABINE (jem SYE ta been) treats some types of cancer. It works by slowing down the growth of cancer cells. This medicine may be used for other purposes; ask your health care provider or pharmacist if you have questions. COMMON BRAND NAME(S): Gemzar, Infugem What should I tell my care team before I take this medication? They need to know if you have any of these conditions: Blood disorders Infection Kidney disease Liver disease Lung or breathing  disease, such as asthma or COPD Recent or ongoing radiation therapy An unusual or allergic reaction to gemcitabine, other medications, foods, dyes, or preservatives If you or your partner are pregnant or trying to get pregnant Breast-feeding How should I use this medication? This medication is injected into a vein. It is given by your care team in a hospital or clinic setting. Talk to your care team about the use of this medication in children. Special care may be needed. Overdosage: If you think you have taken too much of this medicine contact a poison control center or emergency room at once. NOTE: This medicine is only for you. Do  not share this medicine with others. What if I miss a dose? Keep appointments for follow-up doses. It is important not to miss your dose. Call your care team if you are unable to keep an appointment. What may interact with this medication? Interactions have not been studied. This list may not describe all possible interactions. Give your health care provider a list of all the medicines, herbs, non-prescription drugs, or dietary supplements you use. Also tell them if you smoke, drink alcohol, or use illegal drugs. Some items may interact with your medicine. What should I watch for while using this medication? Your condition will be monitored carefully while you are receiving this medication. This medication may make you feel generally unwell. This is not uncommon, as chemotherapy can affect healthy cells as well as cancer cells. Report any side effects. Continue your course of treatment even though you feel ill unless your care team tells you to stop. In some cases, you may be given additional medications to help with side effects. Follow all directions for their use. This medication may increase your risk of getting an infection. Call your care team for advice if you get a fever, chills, sore throat, or other symptoms of a cold or flu. Do not treat yourself. Try to avoid being around people who are sick. This medication may increase your risk to bruise or bleed. Call your care team if you notice any unusual bleeding. Be careful brushing or flossing your teeth or using a toothpick because you may get an infection or bleed more easily. If you have any dental work done, tell your dentist you are receiving this medication. Avoid taking medications that contain aspirin, acetaminophen, ibuprofen, naproxen, or ketoprofen unless instructed by your care team. These medications may hide a fever. Talk to your care team if you or your partner wish to become pregnant or think you might be pregnant. This medication  can cause serious birth defects if taken during pregnancy and for 6 months after the last dose. A negative pregnancy test is required before starting this medication. A reliable form of contraception is recommended while taking this medication and for 6 months after the last dose. Talk to your care team about effective forms of contraception. Do not father a child while taking this medication and for 3 months after the last dose. Use a condom while having sex during this time period. Do not breastfeed while taking this medication and for at least 1 week after the last dose. This medication may cause infertility. Talk to your care team if you are concerned about your fertility. What side effects may I notice from receiving this medication? Side effects that you should report to your care team as soon as possible: Allergic reactions--skin rash, itching, hives, swelling of the face, lips, tongue, or throat Capillary leak syndrome--stomach or muscle pain, unusual  weakness or fatigue, feeling faint or lightheaded, decrease in the amount of urine, swelling of the ankles, hands, or feet, trouble breathing Infection--fever, chills, cough, sore throat, wounds that don't heal, pain or trouble when passing urine, general feeling of discomfort or being unwell Liver injury--right upper belly pain, loss of appetite, nausea, light-colored stool, dark yellow or brown urine, yellowing skin or eyes, unusual weakness or fatigue Low red blood cell level--unusual weakness or fatigue, dizziness, headache, trouble breathing Lung injury--shortness of breath or trouble breathing, cough, spitting up blood, chest pain, fever Stomach pain, bloody diarrhea, pale skin, unusual weakness or fatigue, decrease in the amount of urine, which may be signs of hemolytic uremic syndrome Sudden and severe headache, confusion, change in vision, seizures, which may be signs of posterior reversible encephalopathy syndrome (PRES) Unusual bruising  or bleeding Side effects that usually do not require medical attention (report to your care team if they continue or are bothersome): Diarrhea Drowsiness Hair loss Nausea Pain, redness, or swelling with sores inside the mouth or throat Vomiting This list may not describe all possible side effects. Call your doctor for medical advice about side effects. You may report side effects to FDA at 1-800-FDA-1088. Where should I keep my medication? This medication is given in a hospital or clinic. It will not be stored at home. NOTE: This sheet is a summary. It may not cover all possible information. If you have questions about this medicine, talk to your doctor, pharmacist, or health care provider.  2024 Elsevier/Gold Standard (2022-01-01 00:00:00)  BELOW ARE SYMPTOMS THAT SHOULD BE REPORTED IMMEDIATELY: *FEVER GREATER THAN 100.4 F (38 C) OR HIGHER *CHILLS OR SWEATING *NAUSEA AND VOMITING THAT IS NOT CONTROLLED WITH YOUR NAUSEA MEDICATION *UNUSUAL SHORTNESS OF BREATH *UNUSUAL BRUISING OR BLEEDING *URINARY PROBLEMS (pain or burning when urinating, or frequent urination) *BOWEL PROBLEMS (unusual diarrhea, constipation, pain near the anus) TENDERNESS IN MOUTH AND THROAT WITH OR WITHOUT PRESENCE OF ULCERS (sore throat, sores in mouth, or a toothache) UNUSUAL RASH, SWELLING OR PAIN  UNUSUAL VAGINAL DISCHARGE OR ITCHING   Items with * indicate a potential emergency and should be followed up as soon as possible or go to the Emergency Department if any problems should occur.  Please show the CHEMOTHERAPY ALERT CARD or IMMUNOTHERAPY ALERT CARD at check-in to the Emergency Department and triage nurse.  Should you have questions after your visit or need to cancel or reschedule your appointment, please contact [facility] [place] - A DEPT OF Eligha Brideg[facility] [phone]  and follow the prompts.  Office hours are 8:00 a.m. to 4:30 p.m. Monday - Friday. Please note that voicemails left  after 4:00 p.m. may not be returned until the following business day.  We are closed weekends and major holidays. You have access to a nurse at all times for urgent questions. Please call the main number to the clinic [phone] and follow the prompts.  For any non-urgent questions, you may also contact your provider using MyChart. We now offer e-Visits for anyone 82 and older to request care online for non-urgent symptoms. For details visit [email or web address].   Also download the MyChart app! Go to the app store, search "MyChart", open the app, select [place], and log in with your MyChart username and password.

## 2024-10-05 NOTE — Progress Notes (Signed)
 Patient presents today for chemotherapy Abraxane /Gemzar  infusion. Patient is in satisfactory condition with no new complaints voiced.  Vital signs are stable.  Labs reviewed by Dr. Davonna during the office visit and all labs are within treatment parameters.  We will proceed with treatment per MD orders.   Treatment given today per MD orders. Tolerated infusion without adverse affects. Vital signs stable. No complaints at this time. Discharged from clinic ambulatory with rollater in stable condition. Alert and oriented x 3. F/U with [facility] as scheduled.

## 2024-10-05 NOTE — Progress Notes (Signed)
 Patient has been examined by Dr. Davonna. Vital signs and labs have been reviewed by MD - ANC, Creatinine, LFTs, hemoglobin, and platelets have been reviewed by M.D. - pt may proceed with treatment.  Primary RN and pharmacy notified.

## 2024-10-05 NOTE — Patient Instructions (Addendum)
 [facility] Discharge Instructions   You were seen and examined today by Dr. Davonna.  She reviewed the results of your lab work which are normal/stable.   We will proceed with your treatment today.   Take furosemide  (Lasix ) every morning only as needed for leg swelling. If your legs are not swollen you do not have to take this. A prescription was sent to [facility] in [place].   Return as scheduled.    Thank you for choosing [facility] at [facility] to provide your oncology and hematology care.  To afford each patient quality time with our provider, please arrive at least 15 minutes before your scheduled appointment time.   If you have a lab appointment with the Cancer Center please come in thru the Main Entrance and check in at the main information desk.  You need to re-schedule your appointment should you arrive 10 or more minutes late.  We strive to give you quality time with our providers, and arriving late affects you and other patients whose appointments are after yours.  Also, if you no show three or more times for appointments you may be dismissed from the clinic at the providers discretion.     Again, thank you for choosing [facility].  Our hope is that these requests will decrease the amount of time that you wait before being seen by our physicians.       _____________________________________________________________  Should you have questions after your visit to [facility], please contact our office at [phone] and follow the prompts.  Our office hours are 8:00 a.m. and 4:30 p.m. Monday - Friday.  Please note that voicemails left after 4:00 p.m. may not be returned until the following business day.  We are closed weekends and major holidays.  You do have access to a nurse 24-7, just call the main number to the clinic [phone] and do not press any options, hold on the line and a  nurse will answer the phone.    For prescription refill requests, have your pharmacy contact our office and allow 72 hours.    Due to Covid, you will need to wear a mask upon entering the hospital. If you do not have a mask, a mask will be given to you at the Main Entrance upon arrival. For doctor visits, patients may have 1 support person age 96 or older with them. For treatment visits, patients can not have anyone with them due to social distancing guidelines and our immunocompromised population.

## 2024-10-06 ENCOUNTER — Other Ambulatory Visit: Payer: Self-pay

## 2024-10-07 ENCOUNTER — Inpatient Hospital Stay

## 2024-10-12 ENCOUNTER — Other Ambulatory Visit: Payer: Self-pay | Admitting: Oncology

## 2024-10-12 DIAGNOSIS — C259 Malignant neoplasm of pancreas, unspecified: Secondary | ICD-10-CM

## 2024-10-19 ENCOUNTER — Inpatient Hospital Stay: Admitting: Oncology

## 2024-10-19 ENCOUNTER — Inpatient Hospital Stay

## 2024-10-25 ENCOUNTER — Inpatient Hospital Stay: Admitting: Dietician

## 2024-11-02 ENCOUNTER — Inpatient Hospital Stay: Admitting: Oncology

## 2024-11-02 ENCOUNTER — Inpatient Hospital Stay

## 2024-11-16 ENCOUNTER — Inpatient Hospital Stay

## 2024-11-16 ENCOUNTER — Inpatient Hospital Stay: Admitting: Oncology
